# Patient Record
Sex: Female | Born: 1980 | Hispanic: No | Marital: Married | Smoking: Current every day smoker
Health system: Southern US, Community
[De-identification: ages and names within clinical notes are randomized; demographics above are authoritative.]

## PROBLEM LIST (undated history)

## (undated) DIAGNOSIS — H269 Unspecified cataract: Secondary | ICD-10-CM

## (undated) DIAGNOSIS — Z720 Tobacco use: Secondary | ICD-10-CM

## (undated) DIAGNOSIS — N809 Endometriosis, unspecified: Secondary | ICD-10-CM

## (undated) DIAGNOSIS — T7840XA Allergy, unspecified, initial encounter: Secondary | ICD-10-CM

## (undated) DIAGNOSIS — D649 Anemia, unspecified: Secondary | ICD-10-CM

## (undated) DIAGNOSIS — J45909 Unspecified asthma, uncomplicated: Secondary | ICD-10-CM

## (undated) HISTORY — PX: OTHER SURGICAL HISTORY: SHX169

## (undated) HISTORY — DX: Endometriosis, unspecified: N80.9

## (undated) HISTORY — PX: EYE SURGERY: SHX253

## (undated) HISTORY — PX: ENDOMETRIAL ABLATION: SHX621

## (undated) HISTORY — PX: SPINE SURGERY: SHX786

## (undated) HISTORY — PX: TUBAL LIGATION: SHX77

## (undated) HISTORY — DX: Tobacco use: Z72.0

## (undated) HISTORY — PX: ABDOMINAL HYSTERECTOMY: SHX81

## (undated) HISTORY — PX: PARTIAL HYSTERECTOMY: SHX80

## (undated) HISTORY — PX: BACK SURGERY: SHX140

## (undated) HISTORY — DX: Allergy, unspecified, initial encounter: T78.40XA

## (undated) HISTORY — PX: ROBOT ASSISTED TRACHELECTOMY: SHX6728

## (undated) HISTORY — DX: Unspecified cataract: H26.9

---
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
## 2011-08-31 HISTORY — PX: OTHER SURGICAL HISTORY: SHX169

## 2015-08-31 HISTORY — PX: PARTIAL HYSTERECTOMY: SHX80

## 2019-03-21 ENCOUNTER — Emergency Department (HOSPITAL_COMMUNITY)
Admission: EM | Admit: 2019-03-21 | Discharge: 2019-03-21 | Disposition: A | Discharge status: Home / Self Care | Payer: Medicaid Other | Attending: Emergency Medicine | Admitting: Emergency Medicine

## 2019-03-21 ENCOUNTER — Encounter (HOSPITAL_COMMUNITY): Payer: Self-pay

## 2019-03-21 DIAGNOSIS — L0291 Cutaneous abscess, unspecified: Secondary | ICD-10-CM

## 2019-03-21 DIAGNOSIS — J45909 Unspecified asthma, uncomplicated: Secondary | ICD-10-CM | POA: Diagnosis not present

## 2019-03-21 DIAGNOSIS — L02212 Cutaneous abscess of back [any part, except buttock]: Secondary | ICD-10-CM | POA: Insufficient documentation

## 2019-03-21 HISTORY — DX: Unspecified asthma, uncomplicated: J45.909

## 2019-03-21 MED ORDER — LIDOCAINE HCL 2 % IJ SOLN
20.0000 mL | Freq: Once | INTRAMUSCULAR | Status: AC
  Administered 2019-03-21: 400 mg
  Filled 2019-03-21: qty 20

## 2019-03-21 MED ORDER — IBUPROFEN 800 MG PO TABS: 800.0000 mg | ORAL_TABLET | Freq: Three times a day (TID) | ORAL | 0 refills | Status: DC | PRN

## 2019-03-21 NOTE — ED Notes (Signed)
Pt done HHN early this morning.  Has used inhaler only several times. Pt states "I don't want to use it unless I absolutely have to".  When this nurse inquired.  Pt states she is on her last inhaler and doesn't have a doctor.

## 2019-03-21 NOTE — Discharge Instructions (Addendum)
Return here as needed.  Follow-up with the surgeon as scheduled.  Keep the area around the opening clean and dry.  I would also keep the area covered.

## 2019-03-21 NOTE — ED Triage Notes (Signed)
Pt states that she has a abscess on her upper back that has been there for about 3 weeks with no drainage and that her asthma has been making her feel SOB for two days without relief for MDI, denies cough or fever.

## 2019-03-23 ENCOUNTER — Emergency Department (HOSPITAL_COMMUNITY)
Admission: EM | Admit: 2019-03-23 | Discharge: 2019-03-23 | Disposition: A | Discharge status: Home / Self Care | Payer: Medicaid Other | Attending: Emergency Medicine | Admitting: Emergency Medicine

## 2019-03-23 ENCOUNTER — Emergency Department (HOSPITAL_COMMUNITY): Payer: Medicaid Other

## 2019-03-23 ENCOUNTER — Other Ambulatory Visit: Payer: Self-pay

## 2019-03-23 ENCOUNTER — Encounter (HOSPITAL_COMMUNITY): Payer: Self-pay | Admitting: Emergency Medicine

## 2019-03-23 DIAGNOSIS — J45909 Unspecified asthma, uncomplicated: Secondary | ICD-10-CM | POA: Diagnosis not present

## 2019-03-23 DIAGNOSIS — R509 Fever, unspecified: Secondary | ICD-10-CM

## 2019-03-23 DIAGNOSIS — Z79899 Other long term (current) drug therapy: Secondary | ICD-10-CM | POA: Diagnosis not present

## 2019-03-23 DIAGNOSIS — Z20828 Contact with and (suspected) exposure to other viral communicable diseases: Secondary | ICD-10-CM | POA: Diagnosis not present

## 2019-03-23 DIAGNOSIS — M7918 Myalgia, other site: Secondary | ICD-10-CM | POA: Insufficient documentation

## 2019-03-23 DIAGNOSIS — R0602 Shortness of breath: Secondary | ICD-10-CM

## 2019-03-23 DIAGNOSIS — R52 Pain, unspecified: Secondary | ICD-10-CM

## 2019-03-23 LAB — BASIC METABOLIC PANEL
Anion gap: 8 (ref 5–15)
BUN: 7 mg/dL (ref 6–20)
CO2: 20 mmol/L — ABNORMAL LOW (ref 22–32)
Calcium: 9 mg/dL (ref 8.9–10.3)
Chloride: 110 mmol/L (ref 98–111)
Creatinine, Ser: 0.87 mg/dL (ref 0.44–1.00)
GFR calc Af Amer: >60 mL/min (ref >60)
GFR calc non Af Amer: >60 mL/min (ref >60)
Glucose, Bld: 138 mg/dL — ABNORMAL HIGH (ref 70–99)
Potassium: 3.7 mmol/L (ref 3.5–5.1)
Sodium: 138 mmol/L (ref 135–145)

## 2019-03-23 LAB — CBC WITH DIFFERENTIAL/PLATELET
Abs Immature Granulocytes: 0.02 10*3/uL (ref 0.00–0.07)
Basophils Absolute: 0 10*3/uL (ref 0.0–0.1)
Basophils Relative: 1 %
Eosinophils Absolute: 0 10*3/uL (ref 0.0–0.5)
Eosinophils Relative: 1 %
HCT: 50.7 % — ABNORMAL HIGH (ref 36.0–46.0)
Hemoglobin: 17.2 g/dL — ABNORMAL HIGH (ref 12.0–15.0)
Immature Granulocytes: 0 %
Lymphocytes Relative: 11 %
Lymphs Abs: 0.6 10*3/uL — ABNORMAL LOW (ref 0.7–4.0)
MCH: 30.9 pg (ref 26.0–34.0)
MCHC: 33.9 g/dL (ref 30.0–36.0)
MCV: 91 fL (ref 80.0–100.0)
Monocytes Absolute: 0.4 10*3/uL (ref 0.1–1.0)
Monocytes Relative: 8 %
Neutro Abs: 4.3 10*3/uL (ref 1.7–7.7)
Neutrophils Relative %: 79 %
Platelets: 143 10*3/uL — ABNORMAL LOW (ref 150–400)
RBC: 5.57 MIL/uL — ABNORMAL HIGH (ref 3.87–5.11)
RDW: 11.9 % (ref 11.5–15.5)
WBC: 5.4 10*3/uL (ref 4.0–10.5)
nRBC: 0 % (ref 0.0–0.2)

## 2019-03-23 LAB — D-DIMER, QUANTITATIVE: D-Dimer, Quant: 1.27 ug/mL-FEU — ABNORMAL HIGH (ref 0.00–0.50)

## 2019-03-23 IMAGING — CT CT ANGIOGRAPHY CHEST
2 of 7 series · 18 of 46 positions shown · IV contrast (APPLIED)
Comparison: Portable chest radiograph [DATE]

CLINICAL DATA: Chest pain, pressure and shortness of breath.

EXAM:
CT ANGIOGRAPHY CHEST WITH CONTRAST
TECHNIQUE: Multidetector CT imaging of the chest was performed using the
standard protocol during bolus administration of intravenous
contrast. Multiplanar CT image reconstructions and MIPs were
obtained to evaluate the vascular anatomy.
CONTRAST:  Sixty-three OMNIPAQUE IOHEXOL 350 MG/ML SOLN

[Series 7: thins · axial · 0.66mm/px · z∈[+1128,+1352]mm · 15 of 361 slices shown]
[im 21/361  lung]
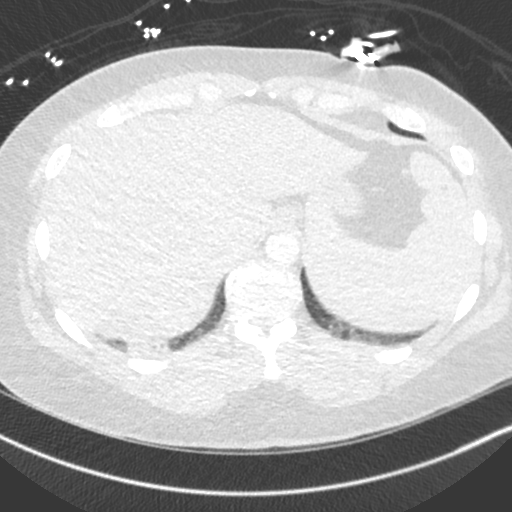
[im 41/361  soft-tissue]
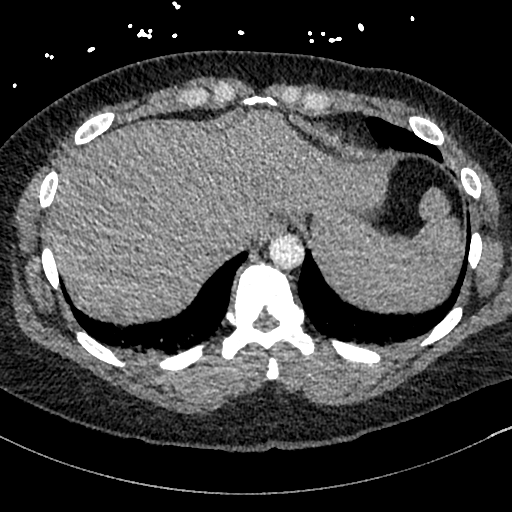
[im 61/361  lung]
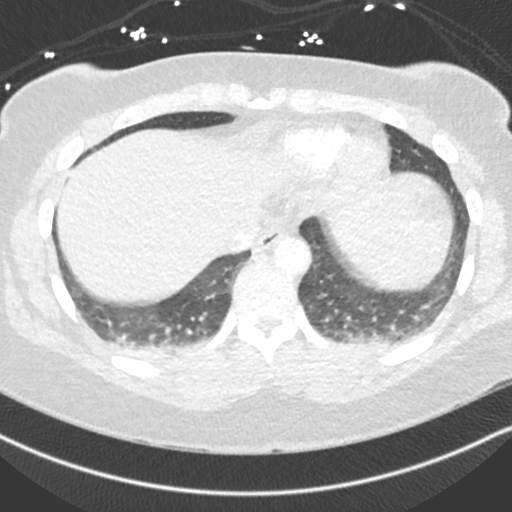
[im 81/361  soft-tissue]
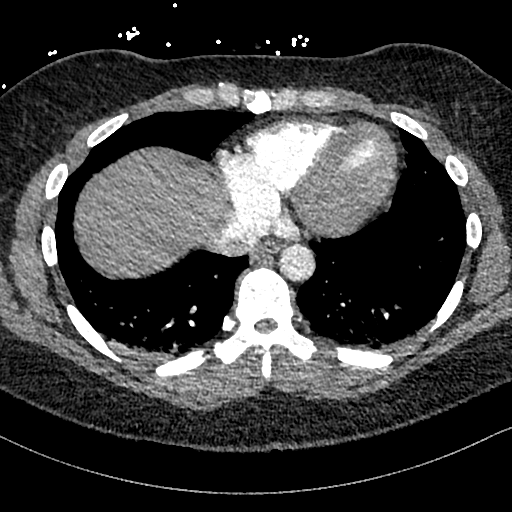
[im 121/361  lung]
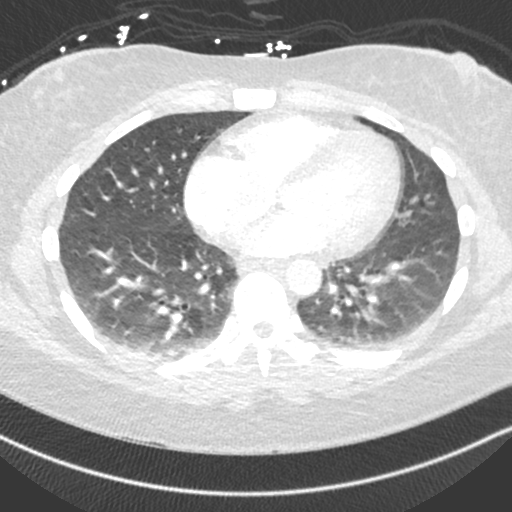
[im 141/361  soft-tissue]
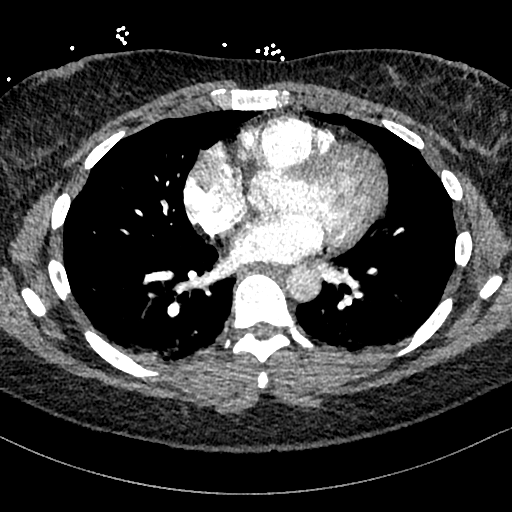
[im 161/361  lung]
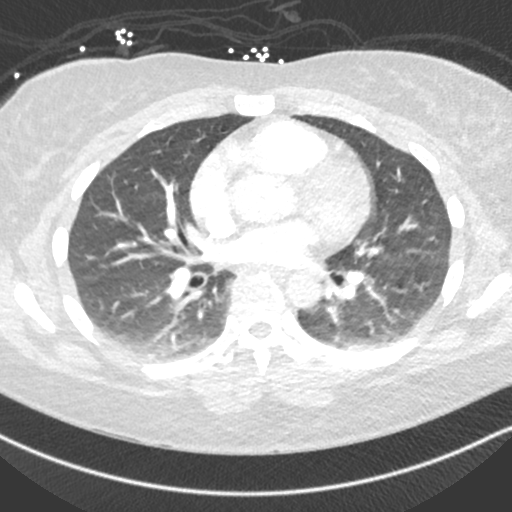
[im 181/361  soft-tissue]
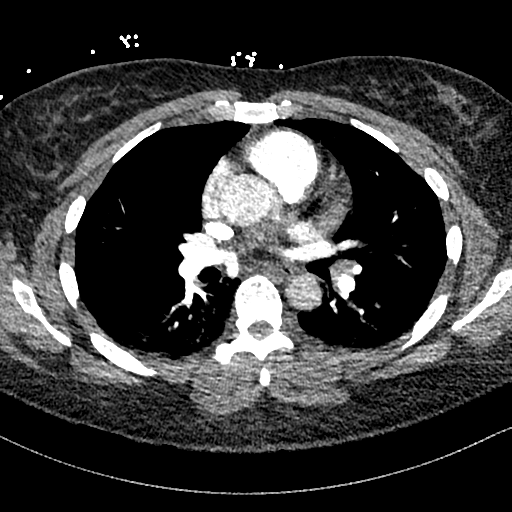
[im 201/361  lung]
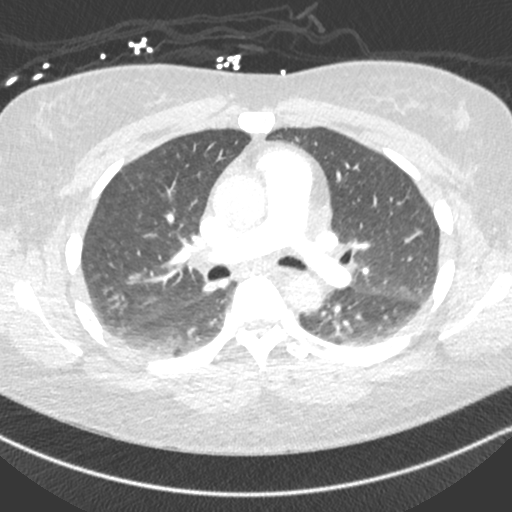
[im 221/361  soft-tissue]
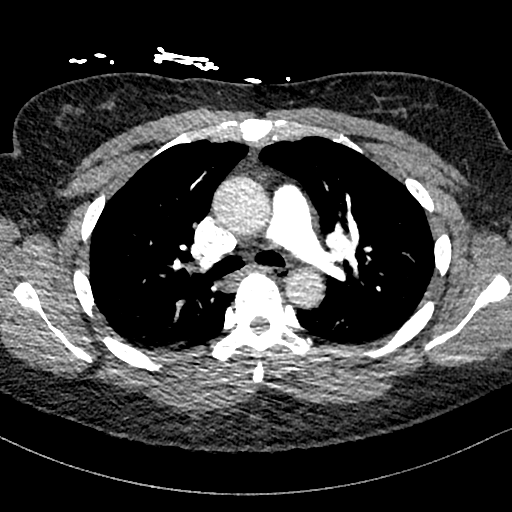
[im 241/361  lung]
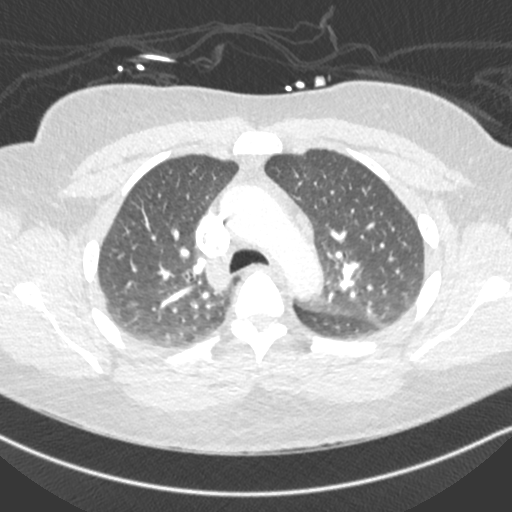
[im 281/361  soft-tissue]
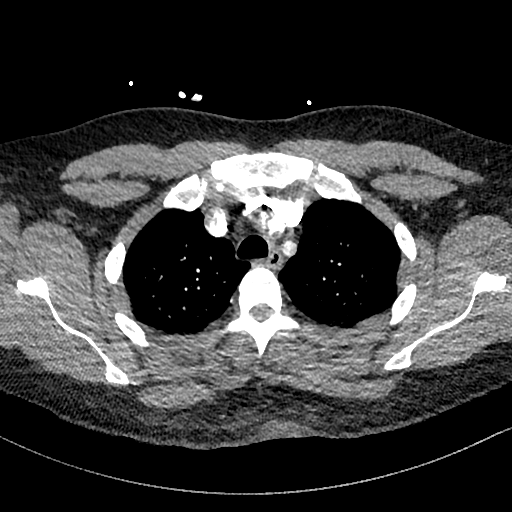
[im 301/361  lung]
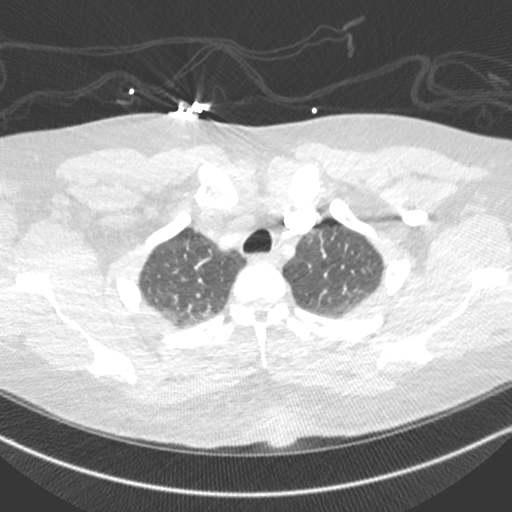
[im 321/361  soft-tissue]
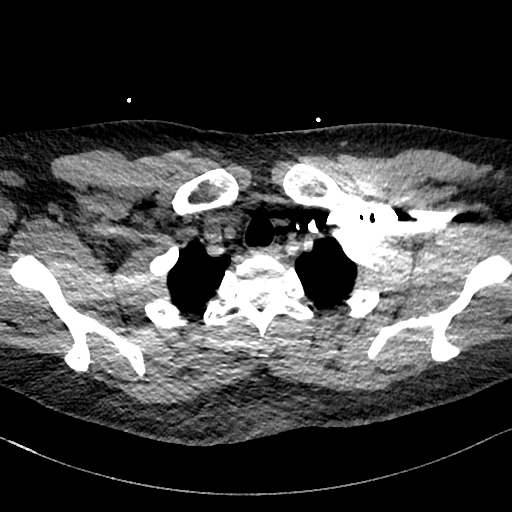
[im 341/361  lung]
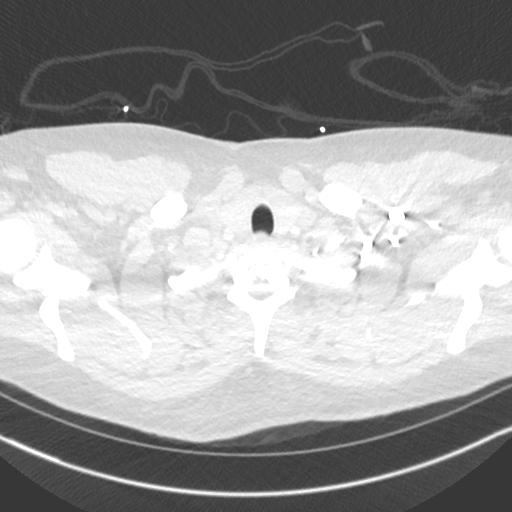

[Series 8: cor · coronal · 0.51mm/px · 3 of 120 slices shown]
[im 30/120  soft-tissue]
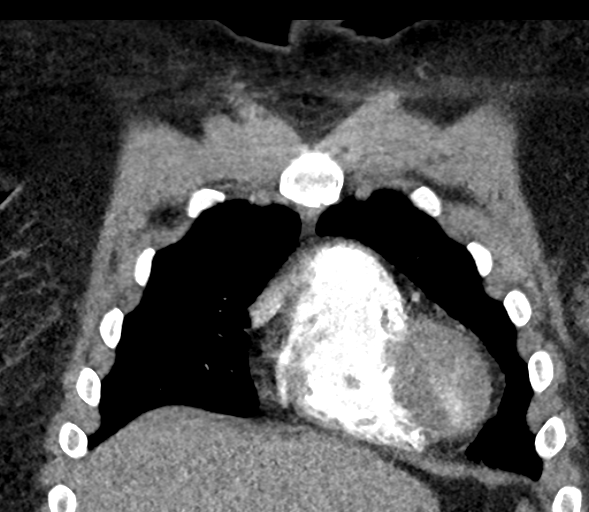
[im 60/120  soft-tissue]
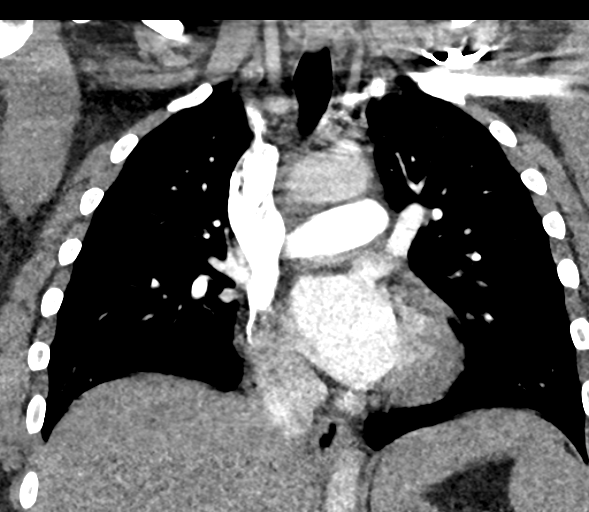
[im 90/120  soft-tissue]
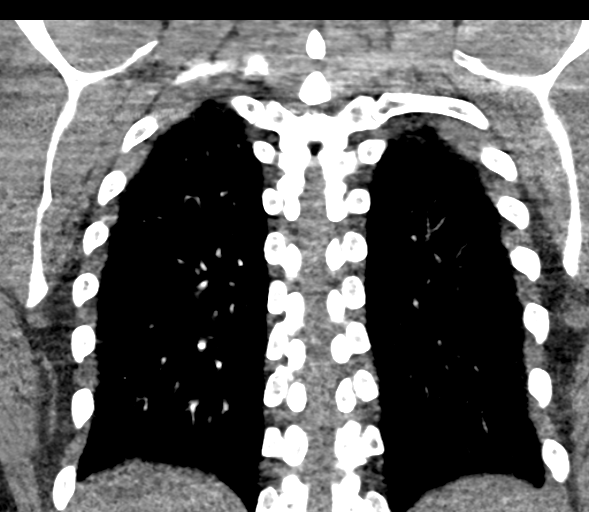

[18 of 46 positions shown; findings below may reference images not displayed]

FINDINGS: Cardiovascular: Satisfactory opacification of the pulmonary arteries
to the segmental level. No evidence of pulmonary embolism. Normal
heart size. No pericardial effusion.

Mediastinum/Nodes: No enlarged mediastinal, hilar, or axillary lymph
nodes. Thyroid gland, trachea, and esophagus demonstrate no
significant findings.

Lungs/Pleura: Bilateral dependent atelectatic changes, otherwise
clear lungs. No pleural effusion or pneumothorax.

Upper Abdomen: No acute abnormality.

Musculoskeletal: 3.6 x 2.5 cm soft tissue thickening in the upper
central back which involves the skin and subcutaneous tissues but
does not extend to the superficial muscular fascia. Normal osseous
structures.

Review of the MIP images confirms the above findings.
IMPRESSION: 1. No evidence of pulmonary embolus.
2. Bilateral dependent atelectatic changes.
3. 3.6 x 2.5 cm soft tissue thickening in the upper central back
which involves the skin and subcutaneous tissues but does not extend
to the superficial muscular fascia.

## 2019-03-23 IMAGING — DX PORTABLE CHEST - 1 VIEW
1 series · 1 of 1 positions shown · non-contrast
Comparison: None.

CLINICAL DATA: Short of breath fever

EXAM:
PORTABLE CHEST 1 VIEW

[chest ap]
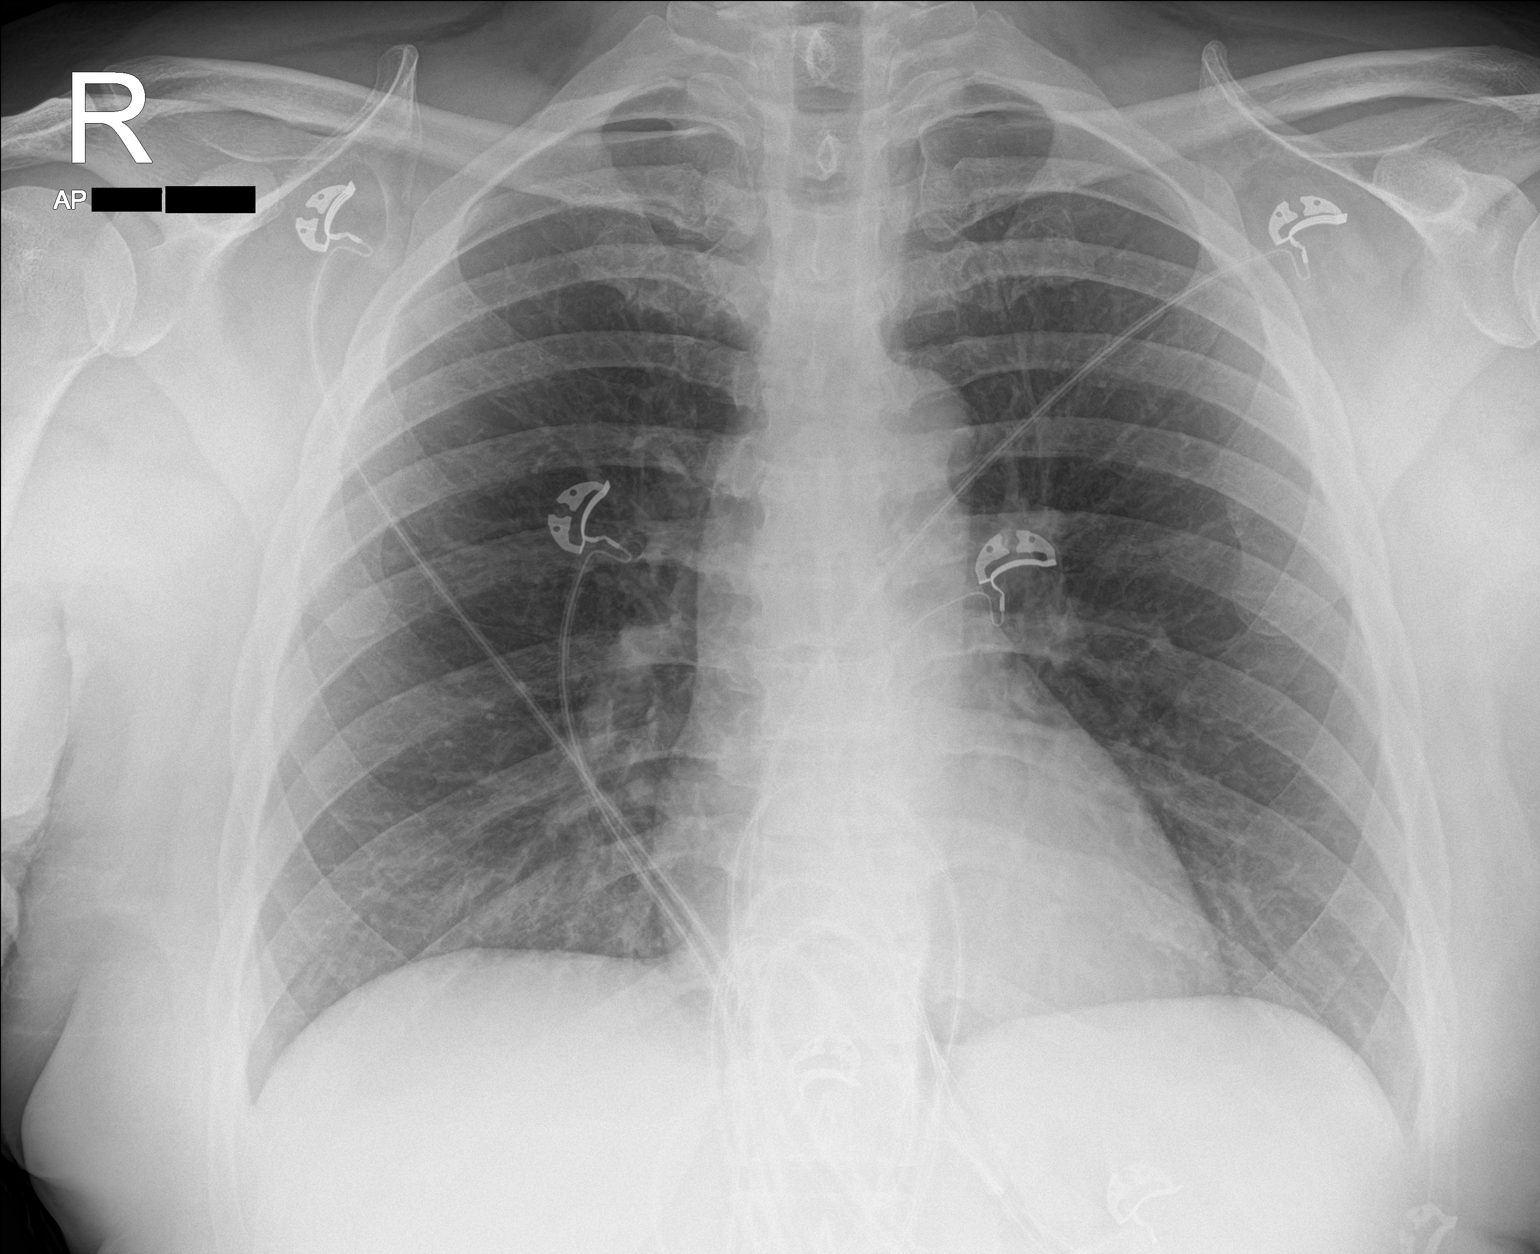

[1 of 1 positions shown; findings below may reference images not displayed]

FINDINGS: The heart size and mediastinal contours are within normal limits.
Both lungs are clear. The visualized skeletal structures are
unremarkable.
IMPRESSION: No active disease.

## 2019-03-23 MED ORDER — ACETAMINOPHEN 325 MG PO TABS
650.0000 mg | ORAL_TABLET | Freq: Once | ORAL | Status: AC
  Administered 2019-03-23: 650 mg via ORAL
  Filled 2019-03-23: qty 2

## 2019-03-23 MED ORDER — ALBUTEROL SULFATE HFA 108 (90 BASE) MCG/ACT IN AERS
4.0000 | INHALATION_SPRAY | Freq: Once | RESPIRATORY_TRACT | Status: AC
  Administered 2019-03-23: 12:00:00 4 via RESPIRATORY_TRACT
  Filled 2019-03-23: qty 6.7

## 2019-03-23 MED ORDER — IOHEXOL 350 MG/ML SOLN
100.0000 mL | Freq: Once | INTRAVENOUS | Status: AC | PRN
  Administered 2019-03-23: 100 mL via INTRAVENOUS

## 2019-03-23 MED ORDER — SODIUM CHLORIDE 0.9 % IV BOLUS
500.0000 mL | Freq: Once | INTRAVENOUS | Status: AC
  Administered 2019-03-23: 14:00:00 500 mL via INTRAVENOUS

## 2019-03-23 MED ORDER — CLINDAMYCIN HCL 150 MG PO CAPS: 150.0000 mg | ORAL_CAPSULE | Freq: Four times a day (QID) | ORAL | 0 refills | Status: DC

## 2019-03-23 NOTE — ED Notes (Signed)
Patient verbalizes understanding of discharge instructions. Opportunity for questioning and answers were provided. Armband removed by staff, pt discharged from ED ambulatory to home.  

## 2019-03-23 NOTE — ED Triage Notes (Signed)
Pt here for fever and chills. Pt had a recent abscess drained from her back. States yesterday her symptoms started. Pt has been taking motrin for her fever.

## 2019-03-23 NOTE — ED Notes (Signed)
Patient transported to CT 

## 2019-03-23 NOTE — ED Provider Notes (Signed)
[place] EMERGENCY DEPARTMENT Provider Note   [name]: [id number] Arrival date & time: 03/23/19  1138    History   Chief Complaint Chief Complaint  Patient presents with  . Shortness of Breath  . Fever  . Generalized Body Aches    HPI Kimberly Armstrong is a 38 y.o. female with a PMH of Asthma presents with fever and body aches onset yesterday. Patient also reports intermittent shortness of breath onset 4 days ago. Patient reports she was evaluated in the ER for an abscess on her back 2 days ago and had an incision and drainage performed. Patient endorses pain over abscess and reports bloody drainage. Patient denies purulent drainage or erythema. Patient states she has taken motrin and albuterol with partial relief of symptoms. Patient was advised to follow up with general surgery, but she has not seen them yet. Patient reports an intermittent dry cough. Patient denies chest pain, congestion, nausea, vomiting, diarrhea, or abdominal pain. Patient denies leg edema, leg pain, or oral contraceptive use. Patient denies sick exposures or recent travel.     HPI  Past Medical History:  Diagnosis Date  . Asthma     There are no active problems to display for this patient.   Past Surgical History:  Procedure Laterality Date  . BACK SURGERY    . carpel tunnel    . CESAREAN SECTION    . PARTIAL HYSTERECTOMY       OB History   No obstetric history on file.      Home Medications    Prior to Admission medications   Medication Sig Start Date End Date Taking? Authorizing Provider  clindamycin (CLEOCIN) 150 MG capsule Take 1 capsule (150 mg total) by mouth every 6 (six) hours. 03/23/19   Darlin Drop P, PA-C  ibuprofen (ADVIL) 800 MG tablet Take 1 tablet (800 mg total) by mouth every 8 (eight) hours as needed. 03/21/19   Dalia Heading, PA-C    Family History No family history on file.  Social History Social History   Tobacco Use  . Smoking  status: Never Smoker  . Smokeless tobacco: Never Used  Substance Use Topics  . Alcohol use: Never    Frequency: Never  . Drug use: Never     Allergies   Patient has no known allergies.   Review of Systems Review of Systems  Constitutional: Positive for fever. Negative for chills and diaphoresis.  Eyes: Negative for visual disturbance.  Respiratory: Positive for cough and shortness of breath.   Cardiovascular: Negative for chest pain.  Gastrointestinal: Negative for abdominal pain, constipation, diarrhea, nausea and vomiting.  Endocrine: Negative for cold intolerance and heat intolerance.  Genitourinary: Negative for dysuria.  Musculoskeletal: Positive for back pain (Pt reports back pain only over abscess. ) and myalgias. Negative for gait problem, neck pain and neck stiffness.  Skin: Positive for wound. Negative for rash.  Allergic/Immunologic: Negative for immunocompromised state.  Neurological: Negative for weakness and numbness.  Hematological: Negative for adenopathy.    Physical Exam Updated Vital Signs BP 136/76   Pulse 86   Temp 99.8 F (37.7 C) (Oral)   Resp 18   Ht 5\' 10"  (1.778 m)   Wt 113.4 kg   SpO2 98%   BMI 35.87 kg/m   Physical Exam Vitals signs and nursing note reviewed.  Constitutional:      General: She is not in acute distress.    Appearance: She is well-developed. She is not diaphoretic.  HENT:  Head: Normocephalic and atraumatic.     Mouth/Throat:     Mouth: Mucous membranes are moist.     Pharynx: No pharyngeal swelling or oropharyngeal exudate.  Eyes:     Extraocular Movements: Extraocular movements intact.     Pupils: Pupils are equal, round, and reactive to light.  Neck:     Musculoskeletal: Normal range of motion and neck supple.  Cardiovascular:     Rate and Rhythm: Normal rate and regular rhythm.     Heart sounds: Normal heart sounds. No murmur. No friction rub. No gallop.   Pulmonary:     Effort: Pulmonary effort is normal.  No respiratory distress.     Breath sounds: Normal breath sounds. No decreased breath sounds, wheezing, rhonchi or rales.  Chest:     Chest wall: No tenderness.  Abdominal:     Palpations: Abdomen is soft.     Tenderness: There is no abdominal tenderness.  Musculoskeletal: Normal range of motion.     Cervical back: Normal. She exhibits normal range of motion, no tenderness and no bony tenderness.     Thoracic back: Normal. She exhibits normal range of motion, no tenderness and no bony tenderness.     Lumbar back: Normal. She exhibits normal range of motion, no tenderness and no bony tenderness.  Skin:    General: Skin is warm.     Findings: No erythema, rash or wound.       Neurological:     Mental Status: She is alert.      ED Treatments / Results  Labs (all labs ordered are listed, but only abnormal results are displayed) Labs Reviewed  BASIC METABOLIC PANEL - Abnormal; Notable for the following components:      Result Value   CO2 20 (*)    Glucose, Bld 138 (*)    All other components within normal limits  CBC WITH DIFFERENTIAL/PLATELET - Abnormal; Notable for the following components:   RBC 5.57 (*)    Hemoglobin 17.2 (*)    HCT 50.7 (*)    Platelets 143 (*)    Lymphs Abs 0.6 (*)    All other components within normal limits  D-DIMER, QUANTITATIVE (NOT AT [facility]) - Abnormal; Notable for the following components:   D-Dimer, Quant 1.27 (*)    All other components within normal limits  NOVEL CORONAVIRUS, NAA (HOSPITAL ORDER, SEND-OUT TO REF LAB)    EKG EKG Interpretation  Date/Time:  Friday March 23 2019 11:56:51 EDT Ventricular Rate:  100 PR Interval:    QRS Duration: 89 QT Interval:  346 QTC Calculation: 447 R Axis:   -12 Text Interpretation:  Sinus tachycardia No old tracing to compare Confirmed by Dorie Rank [phone] on 03/23/2019 12:05:31 PM   Radiology Ct Angio Chest Pe W/cm &/or Wo Cm  Result Date: 03/23/2019 CLINICAL DATA:  Chest pain, pressure and  shortness of breath. EXAM: CT ANGIOGRAPHY CHEST WITH CONTRAST TECHNIQUE: Multidetector CT imaging of the chest was performed using the standard protocol during bolus administration of intravenous contrast. Multiplanar CT image reconstructions and MIPs were obtained to evaluate the vascular anatomy. CONTRAST:  Sixty-three OMNIPAQUE IOHEXOL 350 MG/ML SOLN COMPARISON:  Portable chest radiograph March 23, 2019 FINDINGS: Cardiovascular: Satisfactory opacification of the pulmonary arteries to the segmental level. No evidence of pulmonary embolism. Normal heart size. No pericardial effusion. Mediastinum/Nodes: No enlarged mediastinal, hilar, or axillary lymph nodes. Thyroid gland, trachea, and esophagus demonstrate no significant findings. Lungs/Pleura: Bilateral dependent atelectatic changes, otherwise clear lungs. No pleural effusion or  pneumothorax. Upper Abdomen: No acute abnormality. Musculoskeletal: 3.6 x 2.5 cm soft tissue thickening in the upper central back which involves the skin and subcutaneous tissues but does not extend to the superficial muscular fascia. Normal osseous structures. Review of the MIP images confirms the above findings. IMPRESSION: 1. No evidence of pulmonary embolus. 2. Bilateral dependent atelectatic changes. 3. 3.6 x 2.5 cm soft tissue thickening in the upper central back which involves the skin and subcutaneous tissues but does not extend to the superficial muscular fascia. Electronically Signed   By: Fidela Salisbury M.D.   On: 03/23/2019 15:09   Dg Chest Port 1 View  Result Date: 03/23/2019 CLINICAL DATA:  Short of breath fever EXAM: PORTABLE CHEST 1 VIEW COMPARISON:  None. FINDINGS: The heart size and mediastinal contours are within normal limits. Both lungs are clear. The visualized skeletal structures are unremarkable. IMPRESSION: No active disease. Electronically Signed   By: Franchot Gallo M.D.   On: 03/23/2019 12:39    Procedures Procedures (including critical care time)   Medications Ordered in ED Medications  albuterol (VENTOLIN HFA) 108 (90 Base) MCG/ACT inhaler 4 puff (4 puffs Inhalation Given 03/23/19 1212)  sodium chloride 0.9 % bolus 500 mL (0 mLs Intravenous Stopped 03/23/19 1529)  acetaminophen (TYLENOL) tablet 650 mg (650 mg Oral Given 03/23/19 1404)  iohexol (OMNIPAQUE) 350 MG/ML injection 100 mL (100 mLs Intravenous Contrast Given 03/23/19 1447)    Initial Impression / Assessment and Plan / ED Course  I have reviewed the triage vital signs and the nursing notes.  Pertinent labs & imaging results that were available during my care of the patient were reviewed by me and considered in my medical decision making (see chart for details).  Clinical Course as of Mar 22 1536  Fri Mar 23, 2019  1242 No active disease noted on CXR.  DG Chest Port 1 View [AH]  1249 D dimer elevated at 1.27.   D-Dimer, Quant(!): 1.27 [AH]  1349 WBCs are within normal limits.  WBC: 5.4 [AH]  1512 1. No evidence of pulmonary embolus. 2. Bilateral dependent atelectatic changes. 3. 3.6 x 2.5 cm soft tissue thickening in the upper central back which involves the skin and subcutaneous tissues but does not extend to the superficial muscular fascia.     CT Angio Chest PE W/Cm &/Or Wo Cm [AH]    Clinical Course User Index [AH] Arville Lime, PA-C      Patient presents with shortness of breath, fever, chills, cough, and body aches.  Vitals, labs, and imaging reviewed. EKG reveals sinus tachycardia. No old tracing available for comparison. CXR was negative. WBCs are within normal limits. D dimer ordered due to tachypnea, tachycardia, and shortness of breath symptoms. D dimer elevated at 1.27. CTA ordered. CTA negative for PE. CTA reveals 3.6 x 2.5 cm soft tissue thickening in the upper central back involving skin and subcutaneous tissues, but does not extend to superficial muscular fascia. Oxygen saturations have been stable on room air while in the ER. Ordered COVID-19 testing  due to symptoms. Patient recently had an abscess drained. No surrounding erythema, but more bloody discharge reported. Packing was removed and dressing changed. Will prescribe doxycyline for abscess. Advised patient to follow up with PCP and general surgery. Patient is stable in no acute distress. Discussed strict return precautions. Patient states she understands and agrees with plan.   Findings and plan of care discussed with supervising physician Dr. Tomi Bamberger.   Final Clinical Impressions(s) / ED Diagnoses  Final diagnoses:  Fever in adult  Shortness of breath  Generalized body aches    ED Discharge Orders         Ordered    clindamycin (CLEOCIN) 150 MG capsule  Every 6 hours     03/23/19 [place], [place], [place] 03/23/19 1538    Dorie Rank, MD 03/24/19 [phone]

## 2019-03-23 NOTE — Discharge Instructions (Addendum)
You have been seen today for fever, shortness of breath, and body aches. Your COVID-19 results are pending. You will receive a call if results are positive. Please read and follow all provided instructions.   1. Medications: clindamycin (antibiotic), usual home medications 2. Treatment: rest, drink plenty of fluids 3. Follow Up: Please call and follow up with your general surgeon. Please follow up with your primary doctor in 2 days for discussion of your diagnoses and further evaluation after today's visit; if you do not have a primary care doctor use the resource guide provided to find one; Please return to the ER for any new or worsening symptoms. Please obtain all of your results from medical records or have your doctors office obtain the results - share them with your doctor - you should be seen at your doctors office. Call today to arrange your follow up.  4. Please follow instructions for isolation. We did test you for COVID-19 (coronavirus), and it is still a possibility that you may have been exposed. Please isolate yourself for at least 3 days since recovery without a fever, improvement in respiratory symptoms (cough, shortness of breath), and at least 7 days have passed since symptoms first appeared.   Take medications as prescribed. Please review all of the medicines and only take them if you do not have an allergy to them. Return to the emergency room for worsening condition or new concerning symptoms. Follow up with your regular doctor. If you don't have a regular doctor use one of the numbers below to establish a primary care doctor.  Please be aware that if you are taking birth control pills, taking other prescriptions, ESPECIALLY ANTIBIOTICS may make the birth control ineffective - if this is the case, either do not engage in sexual activity or use alternative methods of birth control such as condoms until you have finished the medicine and your family doctor says it is OK to restart them.  If you are on a blood thinner such as COUMADIN, be aware that any other medicine that you take may cause the coumadin to either work too much, or not enough - you should have your coumadin level rechecked in next 7 days if this is the case.  ?  It is also a possibility that you have an allergic reaction to any of the medicines that you have been prescribed - Everybody reacts differently to medications and while MOST people have no trouble with most medicines, you may have a reaction such as nausea, vomiting, rash, swelling, shortness of breath. If this is the case, please stop taking the medicine immediately and contact your physician.  ?  You should return to the ER if you develop severe or worsening symptoms.   Emergency Department Resource Guide 1) Find a Doctor and Pay Out of Pocket Although you won't have to find out who is covered by your insurance plan, it is a good idea to ask around and get recommendations. You will then need to call the office and see if the doctor you have chosen will accept you as a new patient and what types of options they offer for patients who are self-pay. Some doctors offer discounts or will set up payment plans for their patients who do not have insurance, but you will need to ask so you aren't surprised when you get to your appointment.  2) Contact Your Local Health Department Not all health departments have doctors that can see patients for sick visits, but many do, so  it is worth a call to see if yours does. If you don't know where your local health department is, you can check in your phone book. The CDC also has a tool to help you locate your state's health department, and many state websites also have listings of all of their local health departments.  3) Find a [facility] If your illness is not likely to be very severe or complicated, you may want to try a walk in clinic. These are popping up all over the country in pharmacies, drugstores, and shopping  centers. They're usually staffed by nurse practitioners or physician assistants that have been trained to treat common illnesses and complaints. They're usually fairly quick and inexpensive. However, if you have serious medical issues or chronic medical problems, these are probably not your best option.  No Primary Care Doctor: Call Health Connect at  [phone] - they can help you locate a primary care doctor that  accepts your insurance, provides certain services, etc. Physician Referral Service- [phone]  Chronic Pain Problems: Organization         Address  Phone   Notes  [facility]  [phone] Patients need to be referred by their primary care doctor.   Medication Assistance: Organization         Address  Phone   Notes  [facility] Medication [facility] [address] [place], [place] [address] [phone] --Must be a resident of [place] -- Must have NO insurance coverage whatsoever (no Medicaid/ Medicare, etc.) -- The pt. MUST have a primary care doctor that directs their care regularly and follows them in the community   [facility]  [phone]   [employer]  [phone]    Agencies that provide inexpensive medical care: Organization         Address  Phone   Notes  [place]  [phone]   [facility]    [phone]   [facility] [place], [place] [address] [phone]   [place] [address], [place] [phone]   [facility]    [phone]   [facility]    [phone]   [place] and [place] [address], [place] Phone:  [phone], Fax:  [phone] Hours of Operation:  9 am - 6 pm, M-F.  Also accepts Medicaid/Medicare and self-pay.  [facility] for [address], [address], [place] Phone: [phone], Fax: [phone]. Hours of Operation:  8:30 am - 5:30 pm, M-F.  Also accepts Medicaid and self-pay.  [facility] [address] [place] Phone: [phone]   [place], [place], [place] [phone], Ext. [phone] Mondays & Thursdays: 7-9 AM.  First 15 patients are seen on a first come, first serve basis.    [place] Providers:  Organization         Address  Phone   Notes  [facility] [address], [place] [phone] Also accepts self-pay patients.  [place], [place]  [phone]   [place], [address], [place] [phone]   [facility] [address], [place] [phone]   Lucianne Lei [address], [place]   [phone] Only accepts [employer] patients after they have their name applied to their card.   Self-Pay (no insurance) in [facility]:  Organization         Address  Phone   Notes  Sickle Cell Patients, [facility] [place] [phone]   [facility] [place] [phone]   [facility] [place], [place], [place] [phone]   [facility]/Dr. Osei-Bonsu  [address] [place] or [address], [place] [phone] Phone number for both [place] and [place] locations is the same.  Urgent [facility] and [facility] [address] [place] [phone]   [facility] [address], [place] or [address] [phone]   [facility] [address], [place] [phone], phone; [phone], fax Sees patients 1st and 3rd Saturday of every month.  Must not qualify for public or private insurance (i.e.  Medicaid, Medicare, [place] [employer] Choice, Veterans' Benefits)  Household income should be no more than 200% of the poverty level The clinic cannot treat you if you are pregnant or think you are pregnant  Sexually transmitted diseases are not treated at the clinic.

## 2019-03-24 LAB — NOVEL CORONAVIRUS, NAA (HOSP ORDER, SEND-OUT TO REF LAB; TAT 18-24 HRS): SARS-CoV-2, NAA: NOT DETECTED

## 2019-03-27 NOTE — ED Provider Notes (Signed)
[facility] EMERGENCY DEPARTMENT Provider Note   [name]: [id number] Arrival date & time: 03/21/19  1914     History   Chief Complaint Chief Complaint  Patient presents with  . Abscess  . Asthma    HPI Kimberly Armstrong is a 38 y.o. female.     HPI Patient presents to the emergency department with an abscess to the midportion of her back.  The patient states she is due to see a surgeon on Friday but has continued to have worsening symptoms.  The patient states that she would like to have the area evaluated.  The patient she said no fever, nausea, vomiting, weakness, dizziness, headache, blurred vision, near-syncope or syncope.  Patient states this area is been developing over the last 3 weeks. Past Medical History:  Diagnosis Date  . Asthma     There are no active problems to display for this patient.   Past Surgical History:  Procedure Laterality Date  . BACK SURGERY    . carpel tunnel    . CESAREAN SECTION    . PARTIAL HYSTERECTOMY       OB History   No obstetric history on file.      Home Medications    Prior to Admission medications   Medication Sig Start Date End Date Taking? Authorizing Provider  clindamycin (CLEOCIN) 150 MG capsule Take 1 capsule (150 mg total) by mouth every 6 (six) hours. 03/23/19   Darlin Drop P, PA-C  ibuprofen (ADVIL) 800 MG tablet Take 1 tablet (800 mg total) by mouth every 8 (eight) hours as needed. 03/21/19   Dalia Heading, PA-C    Family History No family history on file.  Social History Social History   Tobacco Use  . Smoking status: Never Smoker  . Smokeless tobacco: Never Used  Substance Use Topics  . Alcohol use: Never    Frequency: Never  . Drug use: Never     Allergies   Patient has no known allergies.   Review of Systems Review of Systems All other systems negative except as documented in the HPI. All pertinent positives and negatives as reviewed in the HPI.  Physical Exam  Updated Vital Signs BP 131/89 (BP Location: Right Arm)   Pulse 71   Temp 99 F (37.2 C) (Oral)   Resp 16   SpO2 100%   Physical Exam Vitals signs and nursing note reviewed.  Constitutional:      General: She is not in acute distress.    Appearance: She is well-developed.  HENT:     Head: Normocephalic and atraumatic.  Eyes:     Pupils: Pupils are equal, round, and reactive to light.  Pulmonary:     Effort: Pulmonary effort is normal.  Musculoskeletal:       Back:  Skin:    General: Skin is warm and dry.  Neurological:     Mental Status: She is alert and oriented to person, place, and time.      ED Treatments / Results  Labs (all labs ordered are listed, but only abnormal results are displayed) Labs Reviewed - No data to display  EKG None  Radiology No results found.  Procedures Procedures (including critical care time)  Medications Ordered in ED Medications  lidocaine (XYLOCAINE) 2 % (with pres) injection 400 mg (400 mg Infiltration Given by Other 03/21/19 2106)     Initial Impression / Assessment and Plan / ED Course  I have reviewed the triage vital signs and the nursing  notes.  Pertinent labs & imaging results that were available during my care of the patient were reviewed by me and considered in my medical decision making (see chart for details).        INCISION AND DRAINAGE Performed by: Resa Miner Chante Mayson Consent: Verbal consent obtained. Risks and benefits: risks, benefits and alternatives were discussed Type: abscess  Body area: Midthoracic area  Anesthesia: local infiltration  Incision was made with a scalpel.  Local anesthetic: lidocaine 2 % without epinephrine  Anesthetic total: 6 ml  Complexity: complex Blunt dissection to break up loculations  Drainage: purulent  Drainage amount: Large  Packing material: 1/4 in iodoform gauze  Patient tolerance: Patient tolerated the procedure well with no immediate complications.    Patient has an appointment with general surgery on Friday.  Advised to follow-up with them.  Have advised her to keep the area clean and dry.  Also advised her to use warm compresses around the area.  Final Clinical Impressions(s) / ED Diagnoses   Final diagnoses:  Abscess    ED Discharge Orders         Ordered    ibuprofen (ADVIL) 800 MG tablet  Every 8 hours PRN     03/21/19 2301           Dalia Heading, PA-C 03/27/19 [place], [place], DO 03/29/19 [phone]

## 2019-09-08 ENCOUNTER — Emergency Department (HOSPITAL_COMMUNITY): Payer: Medicaid Other

## 2019-09-08 ENCOUNTER — Emergency Department (HOSPITAL_COMMUNITY)
Admission: EM | Admit: 2019-09-08 | Discharge: 2019-09-08 | Disposition: A | Discharge status: Home / Self Care | Payer: Medicaid Other | Attending: Emergency Medicine | Admitting: Emergency Medicine

## 2019-09-08 ENCOUNTER — Other Ambulatory Visit: Payer: Self-pay

## 2019-09-08 ENCOUNTER — Encounter (HOSPITAL_COMMUNITY): Payer: Self-pay | Admitting: Emergency Medicine

## 2019-09-08 DIAGNOSIS — R519 Headache, unspecified: Secondary | ICD-10-CM | POA: Insufficient documentation

## 2019-09-08 DIAGNOSIS — R5383 Other fatigue: Secondary | ICD-10-CM | POA: Diagnosis not present

## 2019-09-08 DIAGNOSIS — R110 Nausea: Secondary | ICD-10-CM | POA: Diagnosis not present

## 2019-09-08 DIAGNOSIS — Z79899 Other long term (current) drug therapy: Secondary | ICD-10-CM | POA: Insufficient documentation

## 2019-09-08 DIAGNOSIS — R42 Dizziness and giddiness: Secondary | ICD-10-CM | POA: Diagnosis not present

## 2019-09-08 DIAGNOSIS — J45909 Unspecified asthma, uncomplicated: Secondary | ICD-10-CM | POA: Diagnosis not present

## 2019-09-08 DIAGNOSIS — Z7982 Long term (current) use of aspirin: Secondary | ICD-10-CM | POA: Diagnosis not present

## 2019-09-08 DIAGNOSIS — R079 Chest pain, unspecified: Secondary | ICD-10-CM | POA: Insufficient documentation

## 2019-09-08 LAB — BASIC METABOLIC PANEL
Anion gap: 10 (ref 5–15)
BUN: 8 mg/dL (ref 6–20)
CO2: 21 mmol/L — ABNORMAL LOW (ref 22–32)
Calcium: 9.3 mg/dL (ref 8.9–10.3)
Chloride: 107 mmol/L (ref 98–111)
Creatinine, Ser: 0.79 mg/dL (ref 0.44–1.00)
GFR calc Af Amer: >60 mL/min (ref >60)
GFR calc non Af Amer: >60 mL/min (ref >60)
Glucose, Bld: 116 mg/dL — ABNORMAL HIGH (ref 70–99)
Potassium: 3.9 mmol/L (ref 3.5–5.1)
Sodium: 138 mmol/L (ref 135–145)

## 2019-09-08 LAB — CBC
HCT: 47.8 % — ABNORMAL HIGH (ref 36.0–46.0)
Hemoglobin: 16.2 g/dL — ABNORMAL HIGH (ref 12.0–15.0)
MCH: 30.7 pg (ref 26.0–34.0)
MCHC: 33.9 g/dL (ref 30.0–36.0)
MCV: 90.7 fL (ref 80.0–100.0)
Platelets: 210 10*3/uL (ref 150–400)
RBC: 5.27 MIL/uL — ABNORMAL HIGH (ref 3.87–5.11)
RDW: 12.2 % (ref 11.5–15.5)
WBC: 6.8 10*3/uL (ref 4.0–10.5)
nRBC: 0 % (ref 0.0–0.2)

## 2019-09-08 LAB — LIPID PANEL
Cholesterol: 157 mg/dL (ref 0–200)
HDL: 28 mg/dL — ABNORMAL LOW (ref >40)
LDL Cholesterol: 88 mg/dL (ref 0–99)
Total CHOL/HDL Ratio: 5.6 RATIO
Triglycerides: 206 mg/dL — ABNORMAL HIGH (ref <150)
VLDL: 41 mg/dL — ABNORMAL HIGH (ref 0–40)

## 2019-09-08 LAB — TSH: TSH: 1.175 u[IU]/mL (ref 0.350–4.500)

## 2019-09-08 LAB — I-STAT BETA HCG BLOOD, ED (MC, WL, AP ONLY): I-stat hCG, quantitative: <5 m[IU]/mL (ref <5)

## 2019-09-08 LAB — TROPONIN I (HIGH SENSITIVITY)
Troponin I (High Sensitivity): 5 ng/L (ref <18)
Troponin I (High Sensitivity): 5 ng/L (ref <18)

## 2019-09-08 LAB — T4, FREE: Free T4: 0.81 ng/dL (ref 0.61–1.12)

## 2019-09-08 IMAGING — DX DG CHEST 2V
2 series · 2 of 2 positions shown · non-contrast
Comparison: [DATE]

CLINICAL DATA: Chest pain

EXAM:
CHEST - 2 VIEW

[chest pa]
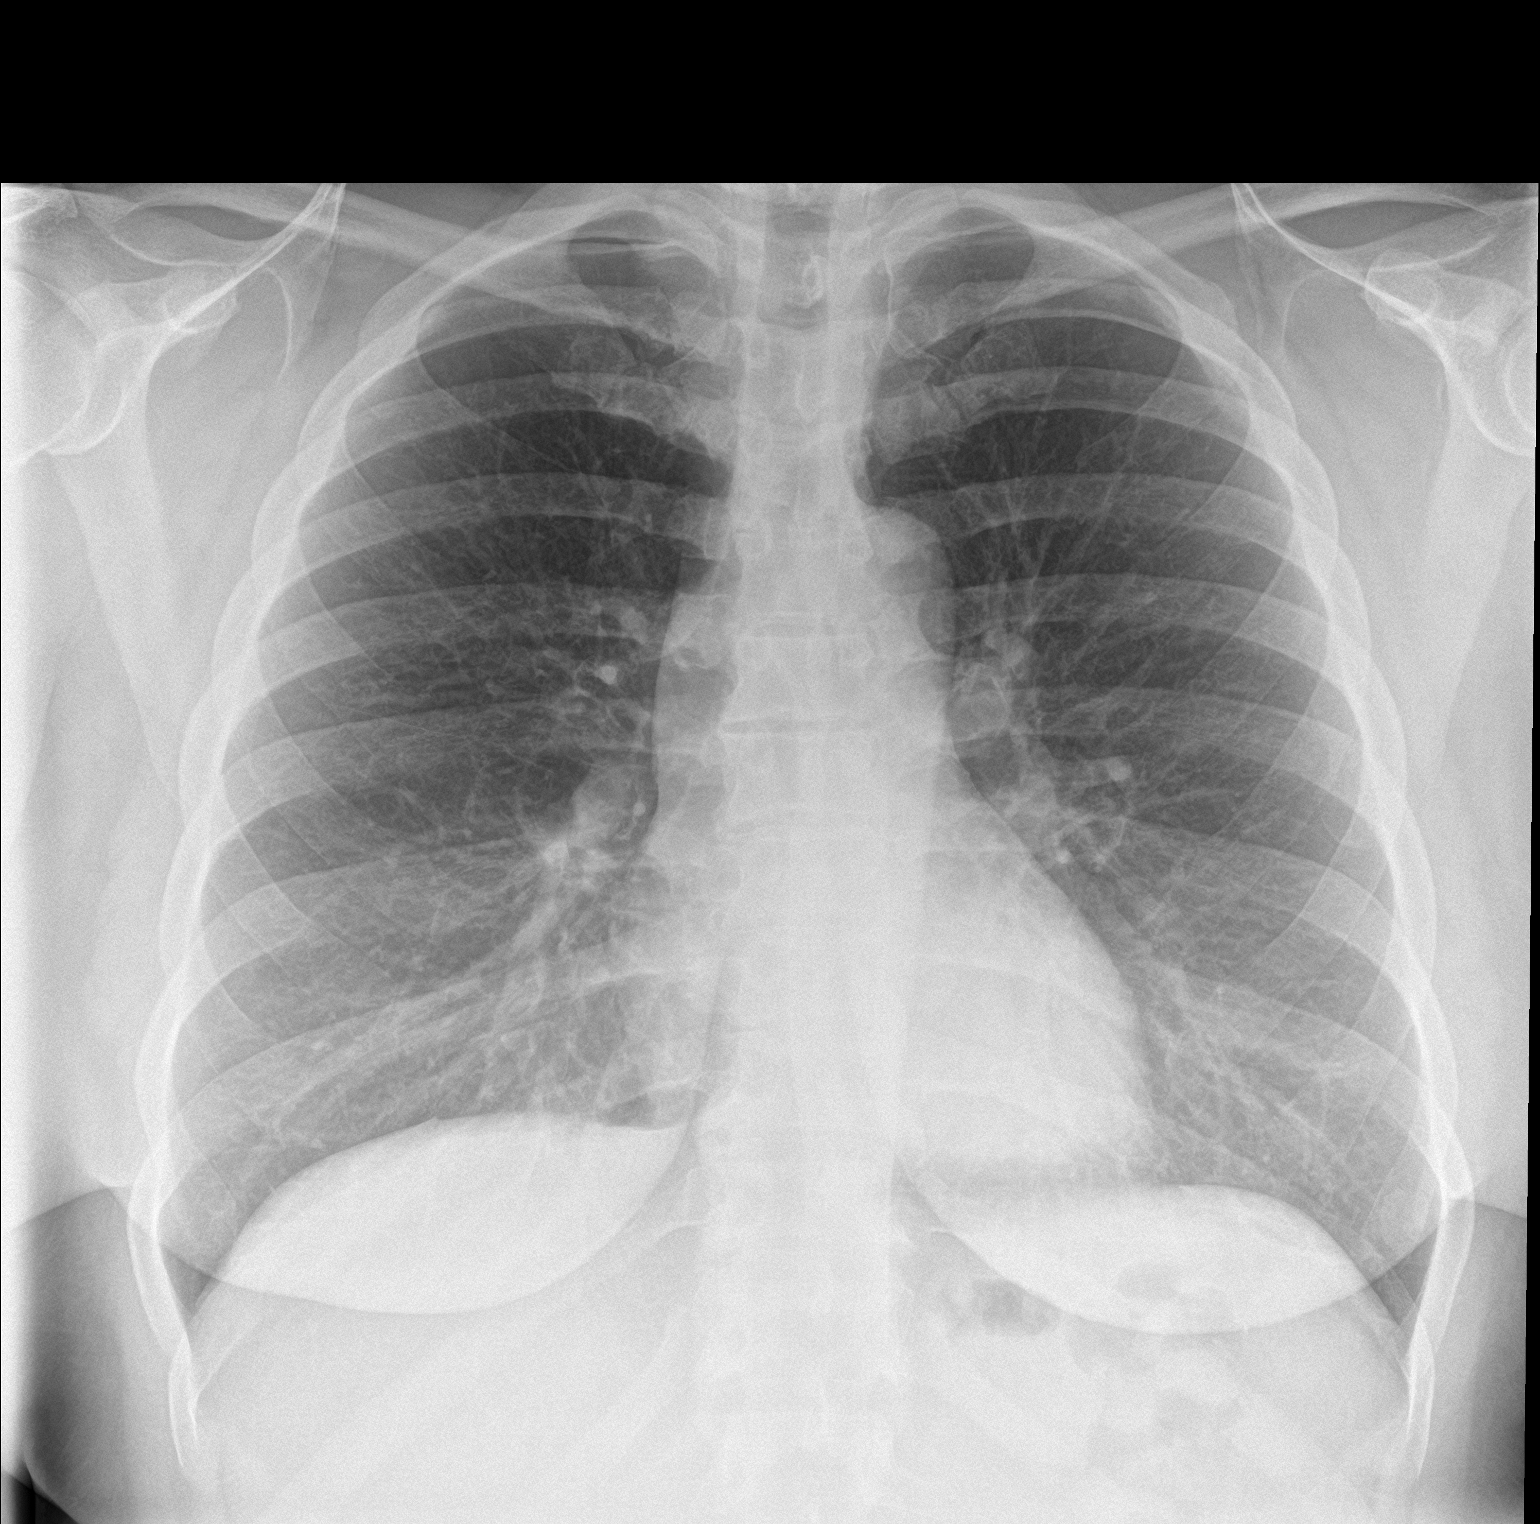

[chest lat]
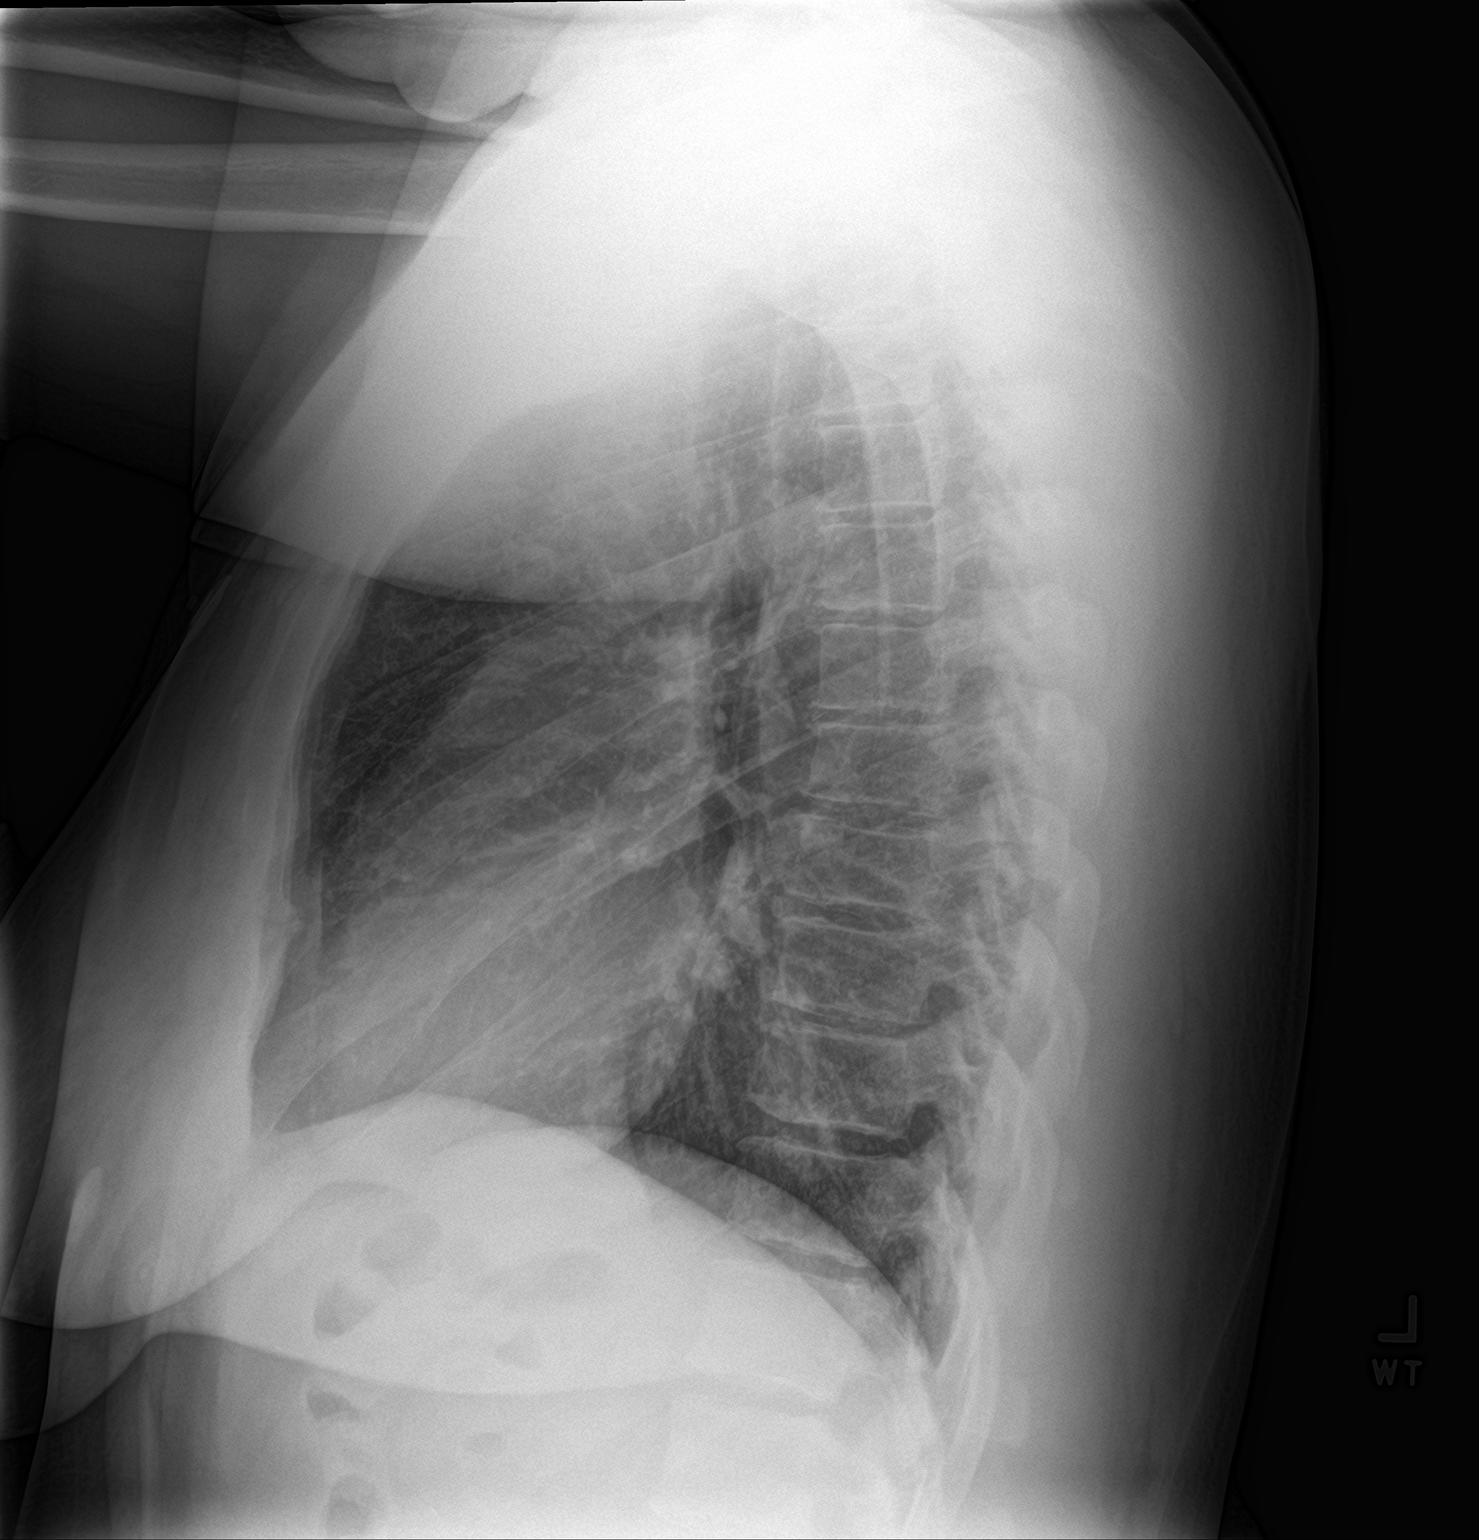

[2 of 2 positions shown; findings below may reference images not displayed]

FINDINGS: The heart size and mediastinal contours are within normal limits.
Both lungs are clear. No pleural effusion or pneumothorax. The
visualized skeletal structures are unremarkable.
IMPRESSION: No acute process in the chest.

## 2019-09-08 MED ORDER — ASPIRIN 81 MG PO CHEW
162.0000 mg | CHEWABLE_TABLET | Freq: Once | ORAL | Status: AC
  Administered 2019-09-08: 18:00:00 162 mg via ORAL
  Filled 2019-09-08: qty 2

## 2019-09-08 MED ORDER — ACETAMINOPHEN 500 MG PO TABS
1000.0000 mg | ORAL_TABLET | Freq: Once | ORAL | Status: AC
  Administered 2019-09-08: 1000 mg via ORAL
  Filled 2019-09-08: qty 2

## 2019-09-08 MED ORDER — NITROGLYCERIN 0.4 MG SL SUBL: 0.4000 mg | SUBLINGUAL_TABLET | SUBLINGUAL | Status: DC | PRN

## 2019-09-08 MED ORDER — SODIUM CHLORIDE 0.9% FLUSH: 3.0000 mL | Freq: Once | INTRAVENOUS | Status: DC

## 2019-09-08 NOTE — ED Triage Notes (Signed)
C/o sharp pain to center of chest that radiates to bilateral arms with nausea and fatigue since this morning.  Reports intermittent chest pain x 3 months.

## 2019-09-08 NOTE — ED Provider Notes (Signed)
[facility] EMERGENCY DEPARTMENT Provider Note   [name]: [id number] Arrival date & time: 09/08/19  1412     History Chief Complaint  Patient presents with  . Chest Pain    Kimberly Armstrong is a 39 y.o. female.  HPI      Pain in central chest, radiates to both arms Has occurred at least once per month for the past 3 mos Today woke up to the pain, took aspirin Drank water but still nauseated Headache When it happens doesn't want to use energy to exert self, not sharp or dull pain, just there, like a "blood rush" Feeling severe fatigue Hx of asthma Feel some chest tightness but not wheezing Once in a great while will have dyspnea Second time it happened was on phone with sister, had dyspnea with that episode, took inhaler Had 2 episodes today one at 830 one around 05-1029  CP not worse with walking. Today woke out of sleep. Second was on phone and walking in kitchen and had to sit down. Has fatigue on exertion but not necessarily chest pain  Has had it with walking around home but not with more vigorous activity for example walking up stairs  Current chest pain radiating to left arm, now 6/10, pressure arm, chest tightness, nausea and headache   No leg pain or swelling Lightheaded when going from sitting to standing No recent surgeries, no long trips, not on OCPs  Smoke cigarettes, rare etoh, no other drugs Mom had triple bypass at 8yo, had mild MI prior to that  Doesn't have regular doctor  Past Medical History:  Diagnosis Date  . Asthma     There are no problems to display for this patient.   Past Surgical History:  Procedure Laterality Date  . BACK SURGERY    . carpel tunnel    . CESAREAN SECTION    . PARTIAL HYSTERECTOMY       OB History   No obstetric history on file.     No family history on file.  Social History   Tobacco Use  . Smoking status: Never Smoker  . Smokeless tobacco: Never Used  Substance Use Topics   . Alcohol use: Never  . Drug use: Never    Home Medications Prior to Admission medications   Medication Sig Start Date End Date Taking? Authorizing Provider  acetaminophen (TYLENOL) 325 MG tablet Take 650 mg by mouth every 6 (six) hours as needed for mild pain or headache.   Yes [provider]  aspirin 81 MG chewable tablet Chew 81 mg by mouth daily.   Yes [provider]  ibuprofen (ADVIL) 200 MG tablet Take 200 mg by mouth every 6 (six) hours as needed for fever, headache or moderate pain.   Yes [provider]  clindamycin (CLEOCIN) 150 MG capsule Take 1 capsule (150 mg total) by mouth every 6 (six) hours. Patient not taking: Reported on 09/08/2019 03/23/19   Darlin Drop P, PA-C  ibuprofen (ADVIL) 800 MG tablet Take 1 tablet (800 mg total) by mouth every 8 (eight) hours as needed. Patient not taking: Reported on 09/08/2019 03/21/19   Dalia Heading, PA-C    Allergies    Patient has no known allergies.  Review of Systems   Review of Systems  Constitutional: Positive for fatigue. Negative for fever.  HENT: Negative for congestion.   Eyes: Negative for visual disturbance.  Respiratory: Negative for cough and shortness of breath.   Cardiovascular: Positive for chest pain.  Gastrointestinal: Positive for nausea. Negative for abdominal pain, diarrhea and vomiting.  Genitourinary: Negative for dysuria.  Skin: Negative for rash.  Neurological: Positive for light-headedness and headaches. Negative for weakness and numbness.    Physical Exam Updated Vital Signs BP 117/69   Pulse 66   Temp 98.5 F (36.9 C) (Oral)   Resp 19   SpO2 99%   Physical Exam Vitals and nursing note reviewed.  Constitutional:      General: She is not in acute distress.    Appearance: She is well-developed. She is not diaphoretic.  HENT:     Head: Normocephalic and atraumatic.  Eyes:     Conjunctiva/sclera: Conjunctivae normal.  Cardiovascular:     Rate and Rhythm:  Normal rate and regular rhythm.     Heart sounds: Normal heart sounds. No murmur. No friction rub. No gallop.   Pulmonary:     Effort: Pulmonary effort is normal. No respiratory distress.     Breath sounds: Normal breath sounds. No wheezing or rales.  Chest:     Chest wall: Tenderness present.  Abdominal:     General: There is no distension.     Palpations: Abdomen is soft.     Tenderness: There is no abdominal tenderness. There is no guarding.  Musculoskeletal:        General: No tenderness.     Cervical back: Normal range of motion.  Skin:    General: Skin is warm and dry.     Findings: No erythema or rash.  Neurological:     Mental Status: She is alert and oriented to person, place, and time.     ED Results / Procedures / Treatments   Labs (all labs ordered are listed, but only abnormal results are displayed) Labs Reviewed  BASIC METABOLIC PANEL - Abnormal; Notable for the following components:      Result Value   CO2 21 (*)    Glucose, Bld 116 (*)    All other components within normal limits  CBC - Abnormal; Notable for the following components:   RBC 5.27 (*)    Hemoglobin 16.2 (*)    HCT 47.8 (*)    All other components within normal limits  LIPID PANEL - Abnormal; Notable for the following components:   Triglycerides 206 (*)    HDL 28 (*)    VLDL 41 (*)    All other components within normal limits  TSH  T4, FREE  I-STAT BETA HCG BLOOD, ED ([facility], [facility], [facility] ONLY)  TROPONIN I (HIGH SENSITIVITY)  TROPONIN I (HIGH SENSITIVITY)    EKG EKG Interpretation  Date/Time:  Saturday September 08 2019 15:49:22 EST Ventricular Rate:  67 PR Interval:  140 QRS Duration: 101 QT Interval:  426 QTC Calculation: 450 R Axis:   -2 Text Interpretation: Sinus rhythm No significant change since last tracing Confirmed by Gareth Morgan [phone]) on 09/08/2019 4:23:19 PM   Radiology DG Chest 2 View  Result Date: 09/08/2019 CLINICAL DATA:  Chest pain EXAM: CHEST - 2 VIEW COMPARISON:   03/23/2019 FINDINGS: The heart size and mediastinal contours are within normal limits. Both lungs are clear. No pleural effusion or pneumothorax. The visualized skeletal structures are unremarkable. IMPRESSION: No acute process in the chest. Electronically Signed   By: Macy Mis M.D.   On: 09/08/2019 15:04    Procedures Procedures (including critical care time)  Medications Ordered in ED Medications  aspirin chewable tablet 162 mg (162 mg Oral Given 09/08/19 1821)  acetaminophen (TYLENOL) tablet 1,000  mg (1,000 mg Oral Given 09/08/19 1821)    ED Course  I have reviewed the triage vital signs and the nursing notes.  Pertinent labs & imaging results that were available during my care of the patient were reviewed by me and considered in my medical decision making (see chart for details).    MDM Rules/Calculators/A&P                      39yo female with history of asthma presents with concern for chest pain.  Differential diagnosis for chest pain includes pulmonary embolus, dissection, pneumothorax, pneumonia, ACS, myocarditis, pericarditis.  EKG was done and evaluate by me and showed no acute ST changes and no signs of pericarditis. Chest x-ray was done and evaluated by me and radiology and showed no sign of pneumonia or pneumothorax. Patient is PERC negative and low risk [place] and have low suspicion for PE.  History and exam not consistent with aortic dissection.   Initial troponin 5 and repeat unchanged.  CP improved. Discussed no signs of ACS.  While work up in ED is reassuring at this time, discussed with patient that given her family history of CAD she does require close follow up and strict return precautions.  Called Cardiology given symptoms and family history and spoke with Dr. Percival Spanish who is sending a message for patient to have close outpatient follow up early this week.   Did order labs to help with outpt care including lipid panel> Discussed low HDL, high tryglicerides with pt  and at this time recommend dietary changes and can further discussion medication management with Cardiology.  TSH normal.  Counseled patient on diet as well as smoking cessation and discussed reasons to return to ED>    Final Clinical Impression(s) / ED Diagnoses Final diagnoses:  Nonspecific chest pain    Rx / DC Orders ED Discharge Orders    None       Gareth Morgan, MD 09/09/19 2351

## 2019-12-27 ENCOUNTER — Telehealth: Payer: Self-pay | Admitting: *Deleted

## 2019-12-27 NOTE — Telephone Encounter (Signed)
Had received message from Dr Percival Spanish  about getting patient new patient appointment Unable to reach patient, number not in service

## 2020-10-03 ENCOUNTER — Ambulatory Visit
Admission: EM | Admit: 2020-10-03 | Discharge: 2020-10-03 | Disposition: A | Discharge status: Home / Self Care | Payer: Medicaid Other | Attending: Emergency Medicine | Admitting: Emergency Medicine

## 2020-10-03 ENCOUNTER — Other Ambulatory Visit: Payer: Self-pay

## 2020-10-03 DIAGNOSIS — N898 Other specified noninflammatory disorders of vagina: Secondary | ICD-10-CM | POA: Diagnosis not present

## 2020-10-03 DIAGNOSIS — R350 Frequency of micturition: Secondary | ICD-10-CM | POA: Diagnosis not present

## 2020-10-03 LAB — POCT URINALYSIS DIP (MANUAL ENTRY)
Bilirubin, UA: NEGATIVE
Glucose, UA: NEGATIVE mg/dL
Ketones, POC UA: NEGATIVE mg/dL
Nitrite, UA: POSITIVE — AB
Protein Ur, POC: 30 mg/dL — AB
Spec Grav, UA: 1.015 (ref 1.010–1.025)
Urobilinogen, UA: 0.2 E.U./dL
pH, UA: 6.5 (ref 5.0–8.0)

## 2020-10-03 MED ORDER — CEPHALEXIN 500 MG PO CAPS: 500.0000 mg | ORAL_CAPSULE | Freq: Two times a day (BID) | ORAL | 0 refills | Status: AC

## 2020-10-03 NOTE — ED Provider Notes (Signed)
[name]    [name]: [id number] Arrival date & time: 10/03/20  0831      History   Chief Complaint Chief Complaint  Patient presents with  . UTI    HPI Kimberly Armstrong is a 40 y.o. female presenting today for evaluation of possible UTI.  She reports that she has had urinary frequency, urgency and incomplete voiding.  Has alsodeveloped vaginal burning and lower abdominal pain. Symptoms began 1 week ago.  Does report remote history of UTIs/BV/yeast, but no recurrent history of any.  Took urosept over-the-counter, patient does report this has altered the color of her urine.  HPI  Past Medical History:  Diagnosis Date  . Asthma     There are no problems to display for this patient.   Past Surgical History:  Procedure Laterality Date  . BACK SURGERY    . carpel tunnel    . CESAREAN SECTION    . PARTIAL HYSTERECTOMY      OB History   No obstetric history on file.      Home Medications    Prior to Admission medications   Medication Sig Start Date End Date Taking? Authorizing Provider  cephALEXin (KEFLEX) 500 MG capsule Take 1 capsule (500 mg total) by mouth 2 (two) times daily for 7 days. 10/03/20 10/10/20 Yes Marquay Kruse C, PA-C  acetaminophen (TYLENOL) 325 MG tablet Take 650 mg by mouth every 6 (six) hours as needed for mild pain or headache.    [provider]  aspirin 81 MG chewable tablet Chew 81 mg by mouth daily.    [provider]    Family History Family History  Family history unknown: Yes    Social History Social History   Tobacco Use  . Smoking status: Never Smoker  . Smokeless tobacco: Never Used  Substance Use Topics  . Alcohol use: Never  . Drug use: Never     Allergies   Patient has no known allergies.   Review of Systems Review of Systems  Constitutional: Negative for fever.  Respiratory: Negative for shortness of breath.   Cardiovascular: Negative for chest pain.  Gastrointestinal:  Negative for abdominal pain, diarrhea, nausea and vomiting.  Genitourinary: Positive for dysuria. Negative for flank pain, genital sores, hematuria, menstrual problem, vaginal bleeding, vaginal discharge and vaginal pain.  Musculoskeletal: Negative for back pain.  Skin: Negative for rash.  Neurological: Negative for dizziness, light-headedness and headaches.     Physical Exam Triage Vital Signs ED Triage Vitals  Enc Vitals Group     BP      Pulse      Resp      Temp      Temp src      SpO2      Weight      Height      Head Circumference      Peak Flow      Pain Score      Pain Loc      Pain Edu?      Excl. in [place]?    No data found.  Updated Vital Signs BP (!) 167/107 (BP Location: Left Arm)   Pulse 76   Temp 98.1 F (36.7 C) (Oral)   Resp 20   SpO2 97%   Visual Acuity Right Eye Distance:   Left Eye Distance:   Bilateral Distance:    Right Eye Near:   Left Eye Near:    Bilateral Near:     Physical Exam Vitals and  nursing note reviewed.  Constitutional:      Appearance: She is well-developed and well-nourished.     Comments: No acute distress  HENT:     Head: Normocephalic and atraumatic.     Nose: Nose normal.  Eyes:     Conjunctiva/sclera: Conjunctivae normal.  Cardiovascular:     Rate and Rhythm: Normal rate.  Pulmonary:     Effort: Pulmonary effort is normal. No respiratory distress.  Abdominal:     General: There is no distension.  Musculoskeletal:        General: Normal range of motion.     Cervical back: Neck supple.  Skin:    General: Skin is warm and dry.  Neurological:     Mental Status: She is alert and oriented to person, place, and time.  Psychiatric:        Mood and Affect: Mood and affect normal.      UC Treatments / Results  Labs (all labs ordered are listed, but only abnormal results are displayed) Labs Reviewed  POCT URINALYSIS DIP (MANUAL ENTRY) - Abnormal; Notable for the following components:      Result Value    Clarity, UA cloudy (*)    Blood, UA large (*)    Protein Ur, POC =30 (*)    Nitrite, UA Positive (*)    Leukocytes, UA Large (3+) (*)    All other components within normal limits  URINE CULTURE  CERVICOVAGINAL ANCILLARY ONLY    EKG   Radiology No results found.  Procedures Procedures (including critical care time)  Medications Ordered in UC Medications - No data to display  Initial Impression / Assessment and Plan / UC Course  I have reviewed the triage vital signs and the nursing notes.  Pertinent labs & imaging results that were available during my care of the patient were reviewed by me and considered in my medical decision making (see chart for details).     UA with large leuks and positive nitrites, will send for culture to confirm as patient did take over-the-counter urosept, also vaginal symptoms, vaginal swab pending to further rule out any vaginal infections contributing to urinary symptoms.  Going ahead and empirically treating for UTI today with Keflex, push fluids.  Alter therapy as needed based off results.  Discussed strict return precautions. Patient verbalized understanding and is agreeable with plan.  Final Clinical Impressions(s) / UC Diagnoses   Final diagnoses:  Urinary frequency  Vaginal itching     Discharge Instructions     Urine culture and vaginal swab pending Begin Keflex twice daily for the next week to treat UTI Drink plenty of water and fluids We will call with results and change treatment if needed    ED Prescriptions    Medication Sig Dispense Auth. Provider   cephALEXin (KEFLEX) 500 MG capsule Take 1 capsule (500 mg total) by mouth 2 (two) times daily for 7 days. 14 capsule Donisha Hoch, [place] C, PA-C     PDMP not reviewed this encounter.   Janith Lima, [place] 10/03/20 [phone]

## 2020-10-03 NOTE — ED Triage Notes (Signed)
Pt presents with urinary frequency, vaginal burning, and lower abdominal pain X 4 days

## 2020-10-03 NOTE — Discharge Instructions (Addendum)
Urine culture and vaginal swab pending Begin Keflex twice daily for the next week to treat UTI Drink plenty of water and fluids We will call with results and change treatment if needed

## 2020-10-05 LAB — URINE CULTURE: Culture: >=100000 — AB

## 2020-10-06 ENCOUNTER — Telehealth (HOSPITAL_COMMUNITY): Payer: Self-pay | Admitting: Emergency Medicine

## 2020-10-06 LAB — CERVICOVAGINAL ANCILLARY ONLY
Bacterial Vaginitis (gardnerella): NEGATIVE
Candida Glabrata: NEGATIVE
Candida Vaginitis: NEGATIVE
Chlamydia: NEGATIVE
Comment: NEGATIVE
Comment: NEGATIVE
Comment: NEGATIVE
Comment: NEGATIVE
Comment: NEGATIVE
Comment: NORMAL
Neisseria Gonorrhea: NEGATIVE
Trichomonas: POSITIVE — AB

## 2020-10-06 MED ORDER — METRONIDAZOLE 500 MG PO TABS: 500.0000 mg | ORAL_TABLET | Freq: Two times a day (BID) | ORAL | 0 refills | Status: DC

## 2020-12-03 ENCOUNTER — Ambulatory Visit (INDEPENDENT_AMBULATORY_CARE_PROVIDER_SITE_OTHER): Payer: Medicaid Other | Admitting: Nurse Practitioner

## 2020-12-03 ENCOUNTER — Telehealth: Payer: Self-pay | Admitting: Nurse Practitioner

## 2020-12-03 ENCOUNTER — Other Ambulatory Visit: Payer: Self-pay

## 2020-12-03 ENCOUNTER — Encounter: Payer: Self-pay | Admitting: Nurse Practitioner

## 2020-12-03 VITALS — BP 127/80 | HR 84 | Temp 97.6°F | Ht 68.9 in | Wt 234.1 lb

## 2020-12-03 DIAGNOSIS — Z7689 Persons encountering health services in other specified circumstances: Secondary | ICD-10-CM | POA: Insufficient documentation

## 2020-12-03 DIAGNOSIS — J4520 Mild intermittent asthma, uncomplicated: Secondary | ICD-10-CM | POA: Diagnosis not present

## 2020-12-03 DIAGNOSIS — M5441 Lumbago with sciatica, right side: Secondary | ICD-10-CM

## 2020-12-03 DIAGNOSIS — Z1231 Encounter for screening mammogram for malignant neoplasm of breast: Secondary | ICD-10-CM | POA: Insufficient documentation

## 2020-12-03 DIAGNOSIS — F17209 Nicotine dependence, unspecified, with unspecified nicotine-induced disorders: Secondary | ICD-10-CM | POA: Insufficient documentation

## 2020-12-03 DIAGNOSIS — F17210 Nicotine dependence, cigarettes, uncomplicated: Secondary | ICD-10-CM | POA: Diagnosis not present

## 2020-12-03 DIAGNOSIS — G8929 Other chronic pain: Secondary | ICD-10-CM

## 2020-12-03 DIAGNOSIS — M5442 Lumbago with sciatica, left side: Secondary | ICD-10-CM

## 2020-12-03 DIAGNOSIS — M545 Low back pain, unspecified: Secondary | ICD-10-CM | POA: Insufficient documentation

## 2020-12-03 MED ORDER — CYCLOBENZAPRINE HCL 10 MG PO TABS: ORAL_TABLET | ORAL | 2 refills | Status: DC

## 2020-12-03 MED ORDER — ALBUTEROL SULFATE HFA 108 (90 BASE) MCG/ACT IN AERS: 2.0000 | INHALATION_SPRAY | Freq: Four times a day (QID) | RESPIRATORY_TRACT | 2 refills | Status: DC | PRN

## 2020-12-03 NOTE — Patient Instructions (Signed)
Asthma Attack Prevention, Adult Although you may not be able to control the fact that you have asthma, you can take actions to prevent episodes of asthma (asthma attacks). How can this condition affect me? Asthma attacks (flare ups) can cause trouble breathing, wheezing, and coughing. They may keep you from doing activities you like to do. What can increase my risk? Coming into contact with things that cause asthma symptoms (asthma triggers) can put you at risk for an asthma attack. Common asthma triggers include:  Things you are allergic to (allergens), such as: ? Dust mite and cockroach droppings. ? Pet dander. ? Mold. ? Pollen from trees and grasses. ? Food allergies. This might be a specific food or added chemicals called sulfites.  Irritants, such as: ? Weather changes including very cold, dry, or humid air. ? Smoke. This includes campfire smoke, air pollution, and tobacco smoke. ? Strong odors from aerosol sprays and fumes from perfume, candles, and household cleaners.  Other triggers, such as: ? Certain medicines. This includes NSAIDs, such as ibuprofen and aspirin. ? Viral respiratory infections (colds), including runny nose (rhinitis) or infection in the sinuses (sinusitis). ? Activity including exercise, laughing, or crying. ? Not using inhaled medicines (corticosteroids) as told. What actions can I take to prevent an asthma attack?  Stay healthy. Stay up to date on all immunizations as told by your health care provider, including the yearly flu (influenza) vaccine and pneumonia vaccine.  Many asthma attacks can be prevented by carefully following your written asthma action plan. Follow your asthma action plan Work with your health care provider to create a written asthma action plan. This plan should include:  A list of your asthma triggers and how to avoid or reduce them.  A list of symptoms that you may have during an asthma attack.  Information about which medicine  to take, when to take the medicine, and how much of the medicine to take.  Information to help you understand your peak flow measurements.  Daily actions that you can take to prevent (control) your asthma symptoms.  Contact information for your health care providers.  If you have an asthma attack, act quickly. Follow the emergency steps on your written asthma action plan. This may prevent you from needing to go to the hospital. Monitor your asthma. To do this:  Use your peak flow meter every morning and every evening for 2-3 weeks or as told by your health care provider. ? Record the results in a journal. ? A drop in your peak flow numbers on one or more days may mean that you are starting to have an asthma attack, even if you are not having symptoms.  When you have asthma symptoms, write them down in a journal.  Write down in your journal how often you need to use your fast-acting rescue inhaler. If you are using your rescue inhaler more often, it may mean that your asthma is not under control. Talk with your health care provider about adjusting your asthma treatment plan to help you prevent future asthma attacks and gain better control of your condition.   Lifestyle  Avoid or reduce contact with known outdoor allergens by staying indoors, keeping windows closed, and using air conditioning when pollen and mold counts are high.  Do not use any products that contain nicotine or tobacco, such as cigarettes, e-cigarettes, and chewing tobacco. If you need help quitting, ask your health care provider.  If you are overweight, consider a weight-loss plan.  Find  ways to cope with stress and your feelings, such as mindfulness, relaxation, or breathing exercises.  Ask your health care provider if a breathing exercise program (pulmonary rehabilitation) may be helpful to control symptoms and improve your quality of life. Medicines  Take over-the-counter and prescription medicines only as told by  your health care provider.  Do not stop taking your medicine and do not take less medicine even if you are doing well.  Let your health care provider know: ? How often you use your rescue inhaler. ? How often you have symptoms when you are taking your regular medicines. ? If you wake up at night because of asthma symptoms. ? If you have more trouble with your breathing when you exercise.   Activity  Do your normal activities as told by your health care provider. Ask your health care provider what activities are safe for you.  Some people have asthma symptoms or more asthma symptoms when they exercise. This is called exercise-induced bronchoconstriction (EIB). If you have this problem, talk with your health care provider about how to manage EIB. Some tips to follow include: ? Use your fast-acting inhaler before exercise. ? Exercise indoors if it is very cold, humid, or the pollen and mold counts are high. ? Warm up and cool down before and after exercise. ? Stop exercising right away if your asthma symptoms start or get worse. Where to find more information  Asthma and Allergy Foundation of America: www.aafa.org  Centers for Disease Control and Prevention: http://www.wolf.info/  American Lung Association: www.lung.org  National Heart, Lung, and Blood Institute: https://wilson-eaton.com/  World Health Organization: RoleLink.com.br Get help right away if:  You have followed your written asthma action plan and your symptoms are not improving. Summary  Asthma attacks (flare ups) can cause trouble breathing, wheezing, and coughing. They may keep you from doing activities you normally like to do.  Work with your health care provider to create a written asthma action plan.  Do not stop taking your medicine and do not take less medicine even if you are doing well.  Do not use any products that contain nicotine or tobacco, such as cigarettes, e-cigarettes, and chewing tobacco. If you need help quitting, ask  your health care provider. This information is not intended to replace advice given to you by your health care provider. Make sure you discuss any questions you have with your health care provider. Document Revised: 08/14/2019 Document Reviewed: 08/14/2019 Elsevier Patient Education  2021 [employer].

## 2020-12-03 NOTE — Progress Notes (Signed)
New Patient Office Visit  Subjective:  Patient ID: Kimberly Armstrong, female    DOB: 1980-12-14  Age: 40 y.o. MRN: [id number]  CC:  Chief Complaint  Patient presents with  . New Patient (Initial Visit)    HPI Kimberly Armstrong presents to establish new primary care provider. She states that she really has not had a primary care doctor or regular doctor at all since the birth of her last child. Child is about to be [age]. She states that she does not have health insurance and that has been a limiting factor in her seeking routine health care.  She has history of asthma. She states that she has no medications for this. She is afraid she is going to have an asthma flare and have no medication to treat it. She states that she does have a chronic cough. This is most severe in the mornings. It is sometimes productive of a brownish colored phlegm. She states that two weeks ago, when she was coughing, she did have a sharp pain in right side of her chest. She states this resolved on it's own and has not come back. She states that she is a smoker. She smokes about 1 pack of cigarettes per day. States that this can be worse if she is socializing.  The patient states that she has persistent lower back pain. This has been worse since she started new job in 08/2020. In May 2011, she had a microdiscectomy of L5/S1 due to severe sciatica. She states that the surgery was successful. Sciatica was relieved. She is now standing for prolonged periods of time at her new job. She states that this has exacerbated the low back pain. This radiates into her hips and her legs .she states that after coming home and going to bed, the pain is worse when she wakes up. She will sometimes take tylenol or ibuprofen to relieve the pain, but she tries to steer away from oral medications. She will use solompass patches and biofreeze. These do help a little bit, but pain generally comes right back.  The patient has not had  any routine, fasting labs done in some time. She states the last time they were done was at an Urgent Care a few years back. Was told her HDL was a little low. She was given a prescription for vitamin D3 to help bring this up. She has not been back since. She needs to have a routine physical and pap smear. She also needs to have screening mammogram.   Past Medical History:  Diagnosis Date  . Asthma     Past Surgical History:  Procedure Laterality Date  . ABDOMINAL HYSTERECTOMY N/A    [facility] 11/30/2020  . BACK SURGERY    . carpel tunnel    . CESAREAN SECTION    . CESAREAN SECTION N/A    [facility] 11/30/2020  . EYE SURGERY N/A    [facility] 11/30/2020  . PARTIAL HYSTERECTOMY    . SPINE SURGERY N/A    [facility] 11/30/2020  . TUBAL LIGATION N/A    [facility] 11/30/2020    Family History  Problem Relation Age of Onset  . Breast cancer Mother   . Heart attack Mother   . Hypertension Mother   . Diabetes Sister     Social History   Socioeconomic History  . Marital status: Married    Spouse name: Macall Mccroskey  . Number of children: 3  . Years of education: Associates degree  . Highest  education level: Associate degree: occupational, Hotel manager, or vocational program  Occupational History  . Occupation: Therapist, art Rep    Employer: [employer]  Tobacco Use  . Smoking status: Current Every Day Smoker    Packs/day: 1.00    Types: Cigarettes    Start date: 12/03/2000  . Smokeless tobacco: Never Used  Substance and Sexual Activity  . Alcohol use: Yes    Alcohol/week: 2.0 standard drinks    Types: 2 Glasses of wine per week  . Drug use: Never  . Sexual activity: Yes    Partners: Male    Birth control/protection: None  Other Topics Concern  . Not on file  Social History Narrative  . Not on file   Social Determinants of Health   Financial Resource Strain: Not on file  Food Insecurity: Not on file  Transportation Needs: Not on file  Physical Activity: Not on file   Stress: Not on file  Social Connections: Not on file  Intimate Partner Violence: Not on file    ROS Review of Systems  Constitutional: Negative for activity change, chills and fever.  HENT: Negative for congestion, postnasal drip, rhinorrhea, sinus pain and sore throat.   Eyes: Negative.   Respiratory: Positive for cough and wheezing.        The patient states that she does have history of intermittent asthma.   Cardiovascular: Negative for chest pain and palpitations.  Gastrointestinal: Negative for constipation, diarrhea, nausea and vomiting.  Endocrine: Negative.   Musculoskeletal: Positive for arthralgias, back pain and myalgias.  Skin: Negative for rash.  Neurological: Negative for dizziness, weakness and headaches.  Hematological: Negative for adenopathy.  Psychiatric/Behavioral: Negative for dysphoric mood and sleep disturbance. The patient is not nervous/anxious.   All other systems reviewed and are negative.   Objective:   Today's Vitals   12/03/20 0857  BP: 127/80  Pulse: 84  Temp: 97.6 F (36.4 C)  SpO2: 99%  Weight: 234 lb 1.6 oz (106.2 kg)  Height: 5' 8.9" (1.75 m)   Body mass index is 34.67 kg/m.   Physical Exam Vitals and nursing note reviewed.  Constitutional:      Appearance: Normal appearance. She is well-developed.  HENT:     Head: Normocephalic and atraumatic.     Nose: Nose normal.  Eyes:     Pupils: Pupils are equal, round, and reactive to light.  Cardiovascular:     Rate and Rhythm: Normal rate and regular rhythm.     Pulses: Normal pulses.     Heart sounds: Normal heart sounds.  Pulmonary:     Effort: Pulmonary effort is normal.     Breath sounds: Normal breath sounds. No wheezing.  Abdominal:     Palpations: Abdomen is soft.  Musculoskeletal:        General: Normal range of motion.     Cervical back: Normal range of motion and neck supple.  Lymphadenopathy:     Cervical: No cervical adenopathy.  Skin:    General: Skin is warm  and dry.     Capillary Refill: Capillary refill takes less than 2 seconds.  Neurological:     General: No focal deficit present.     Mental Status: She is alert and oriented to person, place, and time.  Psychiatric:        Mood and Affect: Mood normal.        Behavior: Behavior normal.        Thought Content: Thought content normal.  Judgment: Judgment normal.     Assessment & Plan:  1. Encounter to establish care Appointment today to establish primary care provider.  2. Mild intermittent asthma without complication Restart albuterol rescue inhaler. Use up to two inhalations four times daily as needed for wheezing and cough. Reassess at next visit.  - albuterol (VENTOLIN HFA) 108 (90 Base) MCG/ACT inhaler; Inhale 2 puffs into the lungs every 6 (six) hours as needed for wheezing or shortness of breath.  Dispense: 1 each; Refill: 2  3. Chronic midline low back pain with bilateral sciatica Add cyclobenzaprine 10mg  - pay take 1/2 to 1 tablet at bedtime as needed for back and muscle pain. She should continue with using heating pad and topical pain relievers as needed and as indicated  - cyclobenzaprine (FLEXERIL) 10 MG tablet; Take 1/2 to 1 tablet po QHS prn back/muscle pain.  Dispense: 30 tablet; Refill: 2  4. Moderate cigarette smoker Discussed risks of long term smoking, especially when asthma is underlying process. She states that she plans to quit in near future.   5. Encounter for screening mammogram for breast cancer Mammogram ordered today  - MM DIGITAL SCREENING BILATERAL; Future  Problem List Items Addressed This Visit      Respiratory   Asthma   Relevant Medications   albuterol (VENTOLIN HFA) 108 (90 Base) MCG/ACT inhaler     Nervous and Auditory   Chronic midline low back pain with bilateral sciatica   Relevant Medications   cyclobenzaprine (FLEXERIL) 10 MG tablet     Other   Encounter to establish care - Primary   Moderate cigarette smoker   Encounter for  screening mammogram for breast cancer   Relevant Orders   MM DIGITAL SCREENING BILATERAL      Outpatient Encounter Medications as of 12/03/2020  Medication Sig  . albuterol (VENTOLIN HFA) 108 (90 Base) MCG/ACT inhaler Inhale 2 puffs into the lungs every 6 (six) hours as needed for wheezing or shortness of breath.  . cyclobenzaprine (FLEXERIL) 10 MG tablet Take 1/2 to 1 tablet po QHS prn back/muscle pain.  . [DISCONTINUED] acetaminophen (TYLENOL) 325 MG tablet Take 650 mg by mouth every 6 (six) hours as needed for mild pain or headache.  . [DISCONTINUED] aspirin 81 MG chewable tablet Chew 81 mg by mouth daily.  . [DISCONTINUED] metroNIDAZOLE (FLAGYL) 500 MG tablet Take 1 tablet (500 mg total) by mouth 2 (two) times daily.   No facility-administered encounter medications on file as of 12/03/2020.   Time spent with the patient was approximately 45 minutes. This time included reviewing progress notes, labs, imaging studies, and discussing plan for follow up.   Follow-up: Return in about 3 months (around 03/04/2021) for physical with pap - needs FBW in next few weeks with Free t4 .   Ronnell Freshwater, NP

## 2020-12-03 NOTE — Telephone Encounter (Signed)
On patients after visit summary at checkout there was the following-   Is there a way to get her scholarship for screening mammogram as she does not health insurance.   Please advise, thanks.

## 2020-12-04 NOTE — Telephone Encounter (Signed)
Awesome! Thank you so much!

## 2020-12-04 NOTE — Telephone Encounter (Signed)
I have reached out to the Mammography scholarship program at [phone] and left a msg for them to contact me in regards to this patient. Also, I have a mammography scholarship application that needs to be filled out by patient. Patient is aware application placed at front desk for pick up and to bring back when form complete for processing. AS, CMA

## 2020-12-05 ENCOUNTER — Other Ambulatory Visit: Payer: Self-pay | Admitting: Nurse Practitioner

## 2020-12-05 DIAGNOSIS — Z0000 Encounter for general adult medical examination without abnormal findings: Secondary | ICD-10-CM

## 2020-12-08 ENCOUNTER — Telehealth: Payer: Self-pay | Admitting: Nurse Practitioner

## 2020-12-08 ENCOUNTER — Other Ambulatory Visit: Payer: Self-pay | Admitting: Nurse Practitioner

## 2020-12-08 DIAGNOSIS — Z0000 Encounter for general adult medical examination without abnormal findings: Secondary | ICD-10-CM

## 2020-12-08 NOTE — Telephone Encounter (Signed)
Patient has the mammogram scholarship fund application and would like to know whether it needs to mailed or faxed to [place] or somewhere else. Please advise, thanks.

## 2020-12-08 NOTE — Telephone Encounter (Signed)
Pt can bring form back to our office and we can fax to appropriate place. AS, CMA

## 2020-12-08 NOTE — Telephone Encounter (Signed)
Patient advised.

## 2020-12-10 ENCOUNTER — Other Ambulatory Visit: Payer: Self-pay

## 2020-12-10 ENCOUNTER — Other Ambulatory Visit: Payer: Medicaid Other

## 2020-12-10 DIAGNOSIS — Z0000 Encounter for general adult medical examination without abnormal findings: Secondary | ICD-10-CM

## 2020-12-10 DIAGNOSIS — Z1231 Encounter for screening mammogram for malignant neoplasm of breast: Secondary | ICD-10-CM

## 2020-12-11 ENCOUNTER — Telehealth: Payer: Self-pay | Admitting: Nurse Practitioner

## 2020-12-11 ENCOUNTER — Encounter: Payer: Self-pay | Admitting: Physician Assistant

## 2020-12-11 ENCOUNTER — Telehealth: Payer: Medicaid Other | Admitting: Physician Assistant

## 2020-12-11 DIAGNOSIS — J302 Other seasonal allergic rhinitis: Secondary | ICD-10-CM | POA: Diagnosis not present

## 2020-12-11 DIAGNOSIS — J45909 Unspecified asthma, uncomplicated: Secondary | ICD-10-CM | POA: Diagnosis not present

## 2020-12-11 LAB — LIPID PANEL
Chol/HDL Ratio: 3.9 ratio (ref 0.0–4.4)
Cholesterol, Total: 130 mg/dL (ref 100–199)
HDL: 33 mg/dL — ABNORMAL LOW (ref >39)
LDL Chol Calc (NIH): 76 mg/dL (ref 0–99)
Triglycerides: 115 mg/dL (ref 0–149)
VLDL Cholesterol Cal: 21 mg/dL (ref 5–40)

## 2020-12-11 LAB — CBC
Hematocrit: 47.9 % — ABNORMAL HIGH (ref 34.0–46.6)
Hemoglobin: 16.6 g/dL — ABNORMAL HIGH (ref 11.1–15.9)
MCH: 31.7 pg (ref 26.6–33.0)
MCHC: 34.7 g/dL (ref 31.5–35.7)
MCV: 91 fL (ref 79–97)
Platelets: 176 10*3/uL (ref 150–450)
RBC: 5.24 x10E6/uL (ref 3.77–5.28)
RDW: 12.4 % (ref 11.7–15.4)
WBC: 4.7 10*3/uL (ref 3.4–10.8)

## 2020-12-11 LAB — HEMOGLOBIN A1C
Est. average glucose Bld gHb Est-mCnc: 114 mg/dL
Hgb A1c MFr Bld: 5.6 % (ref 4.8–5.6)

## 2020-12-11 LAB — COMPREHENSIVE METABOLIC PANEL
ALT: 24 IU/L (ref 0–32)
AST: 18 IU/L (ref 0–40)
Albumin/Globulin Ratio: 1.6 (ref 1.2–2.2)
Albumin: 4.3 g/dL (ref 3.8–4.8)
Alkaline Phosphatase: 75 IU/L (ref 44–121)
BUN/Creatinine Ratio: 10 (ref 9–23)
BUN: 8 mg/dL (ref 6–24)
Bilirubin Total: 0.5 mg/dL (ref 0.0–1.2)
CO2: 20 mmol/L (ref 20–29)
Calcium: 9.3 mg/dL (ref 8.7–10.2)
Chloride: 105 mmol/L (ref 96–106)
Creatinine, Ser: 0.82 mg/dL (ref 0.57–1.00)
Globulin, Total: 2.7 g/dL (ref 1.5–4.5)
Glucose: 93 mg/dL (ref 65–99)
Potassium: 4.1 mmol/L (ref 3.5–5.2)
Sodium: 138 mmol/L (ref 134–144)
Total Protein: 7 g/dL (ref 6.0–8.5)
eGFR: 92 mL/min/{1.73_m2} (ref >59)

## 2020-12-11 LAB — TSH: TSH: 1.89 u[IU]/mL (ref 0.450–4.500)

## 2020-12-11 LAB — T4, FREE: Free T4: 1.14 ng/dL (ref 0.82–1.77)

## 2020-12-11 MED ORDER — CETIRIZINE HCL 10 MG PO TABS: 10.0000 mg | ORAL_TABLET | Freq: Every day | ORAL | 0 refills | Status: DC

## 2020-12-11 MED ORDER — MONTELUKAST SODIUM 10 MG PO TABS: 10.0000 mg | ORAL_TABLET | Freq: Every day | ORAL | 3 refills | Status: DC

## 2020-12-11 MED ORDER — PREDNISONE 50 MG PO TABS: 50.0000 mg | ORAL_TABLET | Freq: Every day | ORAL | 0 refills | Status: AC

## 2020-12-11 MED ORDER — FLUTICASONE PROPIONATE 50 MCG/ACT NA SUSP: 2.0000 | Freq: Every day | NASAL | 0 refills | Status: DC

## 2020-12-11 NOTE — Telephone Encounter (Signed)
patient has asthma, difficulty breathing, no other symptoms. patient was provided urgent care number [phone]- there are no available appointments this morning. Please follow up with patient as to her next actions.

## 2020-12-11 NOTE — Progress Notes (Signed)
Resulted labs look good. Will discuss with patient at her next visit.

## 2020-12-11 NOTE — Patient Instructions (Signed)
Allergies, Adult An allergy is a condition in which the body's defense system (immune system) comes in contact with an allergen and reacts to it. An allergen is anything that causes an allergic reaction. Allergens cause the immune system to make proteins for fighting infections (antibodies). These antibodies cause cells to release chemicals called histamines that set off the symptoms of an allergic reaction. Allergies often affect the nasal passages (allergic rhinitis), eyes (allergic conjunctivitis), skin (atopic dermatitis), and stomach. Allergies can be mild, moderate, or severe. They cannot spread from person to person. Allergies can develop at any age and may be outgrown. What are the causes? This condition is caused by allergens. Common allergens include:  Outdoor allergens, such as pollen, car fumes, and mold.  Indoor allergens, such as dust, smoke, mold, and pet dander.  Other allergens, such as foods, medicines, scents, insect bites or stings, and other skin irritants. What increases the risk? You are more likely to develop this condition if you have:  Family members with allergies.  Family members who have any condition that may be caused by allergens, such as asthma. This may make you more likely to have other allergies. What are the signs or symptoms? Symptoms of this condition depend on the severity of the allergy. Mild to moderate symptoms  Runny nose, stuffy nose (nasal congestion), or sneezing.  Itchy mouth, ears, or throat.  A feeling of mucus dripping down the back of your throat (postnasal drip).  Sore throat.  Itchy, red, watery, or puffy eyes.  Skin rash, or itchy, red, swollen areas of skin (hives).  Stomach cramps or bloating. Severe symptoms Severe allergies to food, medicine, or insect bites may cause anaphylaxis, which can be life-threatening. Symptoms include:  A red (flushed) face.  Wheezing or coughing.  Swollen lips, tongue, or mouth.  Tight or  swollen throat.  Chest pain or tightness, or rapid heartbeat.  Trouble breathing or shortness of breath.  Pain in the abdomen, vomiting, or diarrhea.  Dizziness or fainting. How is this diagnosed? This condition is diagnosed based on your symptoms, your family and medical history, and a physical exam. You may also have tests, including:  Skin tests to see how your skin reacts to allergens that may be causing your symptoms. Tests include: ? Skin prick test. For this test, an allergen is introduced to your body through a small opening in the skin. ? Intradermal skin test. For this test, a small amount of allergen is injected under the first layer of your skin. ? Patch test. For this test, a small amount of allergen is placed on your skin. The area is covered and then checked after a few days.  Blood tests.  A challenge test. For this test, you will eat or breathe in a small amount of allergen to see if you have an allergic reaction. You may also be asked to:  Keep a food diary. This is a record of all the foods, drinks, and symptoms you have in a day.  Try an elimination diet. To do this: ? Remove certain foods from your diet. ? Add those foods back one by one to find out if any foods cause an allergic reaction. How is this treated? Treatment for allergies depends on your symptoms. Treatment may include:  Cold, wet cloths (cold compresses) to soothe itching and swelling.  Eye drops or nasal sprays.  Nasal irrigation to help clear your mucus or keep the nasal passages moist.  A humidifier to add moisture to the  air.  Skin creams to treat rashes or itching.  Oral antihistamines or other medicines to block the reaction or to treat inflammation.  Diet changes to remove foods that cause allergies.  Being exposed again and again to tiny amounts of allergens to help you build a defense against it (tolerance). This is called immunotherapy. Examples include: ? Allergy shot. You  receive an injection that contains an allergen. ? Sublingual immunotherapy. You take a small dose of allergen under your tongue.  Emergency injection for anaphylaxis. You give yourself a shot using a syringe (auto-injector) that contains the amount of medicine you need. Your health care provider will teach you how to give yourself an injection.      Follow these instructions at home: Medicines  Take or apply over-the-counter and prescription medicines only as told by your health care provider.  Always carry your auto-injector pen if you are at risk of anaphylaxis. Give yourself an injection as told by your health care provider.   Eating and drinking  Follow instructions from your health care provider about eating or drinking restrictions.  Drink enough fluid to keep your urine pale yellow. General instructions  Wear a medical alert bracelet or necklace to let others know that you have had anaphylaxis before.  Avoid known allergens whenever possible.  Keep all follow-up visits as told by your health care provider. This is important. Contact a health care provider if:  Your symptoms do not get better with treatment. Get help right away if:  You have symptoms of anaphylaxis. These include: ? Swollen mouth, tongue, or throat. ? Pain or tightness in your chest. ? Trouble breathing or shortness of breath. ? Dizziness or fainting. ? Severe abdominal pain, vomiting, or diarrhea. These symptoms may represent a serious problem that is an emergency. Do not wait to see if the symptoms will go away. Get medical help right away. Call your local emergency services (911 in the [place] Do not drive yourself to the hospital. Summary  Take or apply over-the-counter and prescription medicines only as told by your health care provider.  Avoid known allergens when possible.  Always carry your auto-injector pen if you are at risk of anaphylaxis. Give yourself an injection as told by your health  care provider.  Wear a medical alert bracelet or necklace to let others know that you have had anaphylaxis before.  Anaphylaxis is a life-threatening emergency. Get help right away. This information is not intended to replace advice given to you by your health care provider. Make sure you discuss any questions you have with your health care provider. Document Revised: 04/14/2020 Document Reviewed: 06/27/2019 Elsevier Patient Education  2021 [employer]. http://www.aaaai.org/conditions-and-treatments/asthma">  Asthma, Adult  Asthma is a long-term (chronic) condition that causes recurrent episodes in which the airways become tight and narrow. The airways are the passages that lead from the nose and mouth down into the lungs. Asthma episodes, also called asthma attacks, can cause coughing, wheezing, shortness of breath, and chest pain. The airways can also fill with mucus. During an attack, it can be difficult to breathe. Asthma attacks can range from minor to life threatening. Asthma cannot be cured, but medicines and lifestyle changes can help control it and treat acute attacks. What are the causes? This condition is believed to be caused by inherited (genetic) and environmental factors, but its exact cause is not known. There are many things that can bring on an asthma attack or make asthma symptoms worse (triggers). Asthma triggers are different  for each person. Common triggers include:  Mold.  Dust.  Cigarette smoke.  Cockroaches.  Things that can cause allergy symptoms (allergens), such as animal dander or pollen from trees or grass.  Air pollutants such as household cleaners, wood smoke, smog, or [employer] account planner.  Cold air, weather changes, and winds (which increase molds and pollen in the air).  Strong emotional expressions such as crying or laughing hard.  Stress.  Certain medicines (such as aspirin) or types of medicines (such as beta-blockers).  Sulfites in foods and  drinks. Foods and drinks that may contain sulfites include dried fruit, potato chips, and sparkling grape juice.  Infections or inflammatory conditions such as the flu, a cold, or inflammation of the nasal membranes (rhinitis).  Gastroesophageal reflux disease (GERD).  Exercise or strenuous activity. What are the signs or symptoms? Symptoms of this condition may occur right after asthma is triggered or many hours later. Symptoms include:  Wheezing. This can sound like whistling when you breathe.  Excessive nighttime or early morning coughing.  Frequent or severe coughing with a common cold.  Chest tightness.  Shortness of breath.  Tiredness (fatigue) with minimal activity. How is this diagnosed? This condition is diagnosed based on:  Your medical history.  A physical exam.  Tests, which may include: ? Lung function studies and pulmonary studies (spirometry). These tests can evaluate the flow of air in your lungs. ? Allergy tests. ? Imaging tests, such as X-rays. How is this treated? There is no cure for this condition, but treatment can help control your symptoms. Treatment for asthma usually involves:  Identifying and avoiding your asthma triggers.  Using medicines to control your symptoms. Generally, two types of medicines are used to treat asthma: ? Controller medicines. These help prevent asthma symptoms from occurring. They are usually taken every day. ? Fast-acting reliever or rescue medicines. These quickly relieve asthma symptoms by widening the narrow and tight airways. They are used as needed and provide short-term relief.  Using supplemental oxygen. This may be needed during a severe episode.  Using other medicines, such as: ? Allergy medicines, such as antihistamines, if your asthma attacks are triggered by allergens. ? Immune medicines (immunomodulators). These are medicines that help control the immune system.  Creating an asthma action plan. An asthma  action plan is a written plan for managing and treating your asthma attacks. This plan includes: ? A list of your asthma triggers and how to avoid them. ? Information about when medicines should be taken and when their dosage should be changed. ? Instructions about using a device called a peak flow meter. A peak flow meter measures how well the lungs are working and the severity of your asthma. It helps you monitor your condition. Follow these instructions at home: Controlling your home environment Control your home environment in the following ways to help avoid triggers and prevent asthma attacks:  Change your heating and air conditioning filter regularly.  Limit your use of fireplaces and wood stoves.  Get rid of pests (such as roaches and mice) and their droppings.  Throw away plants if you see mold on them.  Clean floors and dust surfaces regularly. Use unscented cleaning products.  Try to have someone else vacuum for you regularly. Stay out of rooms while they are being vacuumed and for a short while afterward. If you vacuum, use a dust mask from a hardware store, a double-layered or microfilter vacuum cleaner bag, or a vacuum cleaner with a HEPA filter.  Replace carpet with wood, tile, or vinyl flooring. Carpet can trap dander and dust.  Use allergy-proof pillows, mattress covers, and box spring covers.  Keep your bedroom a trigger-free room.  Avoid pets and keep windows closed when allergens are in the air.  Wash beddings every week in hot water and dry them in a dryer.  Use blankets that are made of polyester or cotton.  Clean bathrooms and kitchens with bleach. If possible, have someone repaint the walls in these rooms with mold-resistant paint. Stay out of the rooms that are being cleaned and painted.  Wash your hands often with soap and water. If soap and water are not available, use hand sanitizer.  Do not allow anyone to smoke in your home. General  instructions  Take over-the-counter and prescription medicines only as told by your health care provider. ? Speak with your health care provider if you have questions about how or when to take the medicines. ? Make note if you are requiring more frequent dosages.  Do not use any products that contain nicotine or tobacco, such as cigarettes and e-cigarettes. If you need help quitting, ask your health care provider. Also, avoid being exposed to secondhand smoke.  Use a peak flow meter as told by your health care provider. Record and keep track of the readings.  Understand and use the asthma action plan to help minimize, or stop an asthma attack, without needing to seek medical care.  Make sure you stay up to date on your yearly vaccinations as told by your health care provider. This may include vaccines for the flu and pneumonia.  Avoid outdoor activities when allergen counts are high and when air quality is low.  Wear a ski mask that covers your nose and mouth during outdoor winter activities. Exercise indoors on cold days if you can.  Warm up before exercising, and take time for a cool-down period after exercise.  Keep all follow-up visits as told by your health care provider. This is important. Where to find more information  For information about asthma, turn to the Centers for Disease Control and Prevention at http://www.clark.net/  For air quality information, turn to [employer] at https://www.miller-reyes.info/ Contact a health care provider if:  You have wheezing, shortness of breath, or a cough even while you are taking medicine to prevent attacks.  The mucus you cough up (sputum) is thicker than usual.  Your sputum changes from clear or white to yellow, green, gray, or bloody.  Your medicines are causing side effects, such as a rash, itching, swelling, or trouble breathing.  You need to use a reliever medicine more than 2-3 times a week.  Your peak flow reading is still at 50-79% of your  personal best after following your action plan for 1 hour.  You have a fever. Get help right away if:  You are getting worse and do not respond to treatment during an asthma attack.  You are short of breath when at rest or when doing very little physical activity.  You have difficulty eating, drinking, or talking.  You have chest pain or tightness.  You develop a fast heartbeat or palpitations.  You have a bluish color to your lips or fingernails.  You are light-headed or dizzy, or you faint.  Your peak flow reading is less than 50% of your personal best.  You feel too tired to breathe normally. Summary  Asthma is a long-term (chronic) condition that causes recurrent episodes in which the airways become tight  and narrow. These episodes can cause coughing, wheezing, shortness of breath, and chest pain.  Asthma cannot be cured, but medicines and lifestyle changes can help control it and treat acute attacks.  Make sure you understand how to avoid triggers and how and when to use your medicines.  Asthma attacks can range from minor to life threatening. Get help right away if you have an asthma attack and do not respond to treatment with your usual rescue medicines. This information is not intended to replace advice given to you by your health care provider. Make sure you discuss any questions you have with your health care provider. Document Revised: 05/16/2020 Document Reviewed: 12/19/2019 Elsevier Patient Education  2021 [employer].

## 2020-12-11 NOTE — Telephone Encounter (Signed)
I gave her new prescription for rescue inhaler when she was seen at first visit. She probably needs breathing treatment and steroid injection. She should go to urgent care. They may also do chest x-ray there.

## 2020-12-11 NOTE — Progress Notes (Signed)
Kimberly Armstrong are scheduled for a virtual visit with your provider today.    Just as we do with appointments in the office, we must obtain your consent to participate.  Your consent will be active for this visit and any virtual visit you may have with one of our providers in the next 365 days.    If you have a MyChart account, I can also send a copy of this consent to you electronically.  All virtual visits are billed to your insurance company just like a traditional visit in the office.  As this is a virtual visit, video technology does not allow for your provider to perform a traditional examination.  This may limit your provider's ability to fully assess your condition.  If your provider identifies any concerns that need to be evaluated in person or the need to arrange testing such as labs, EKG, etc, we will make arrangements to do so.    Although advances in technology are sophisticated, we cannot ensure that it will always work on either your end or our end.  If the connection with a video visit is poor, we may have to switch to a telephone visit.  With either a video or telephone visit, we are not always able to ensure that we have a secure connection.   I need to obtain your verbal consent now.   Are you willing to proceed with your visit today?   Kimberly Armstrong has provided verbal consent on 12/11/2020 for a virtual visit (video or telephone).   Kimberly Booze, PA-C 12/11/2020  8:46 AM   [name],you are scheduled for a virtual visit with your provider today.    Just as we do with appointments in the office, we must obtain your consent to participate.  Your consent will be active for this visit and any virtual visit you may have with one of our providers in the next 365 days.    If you have a MyChart account, I can also send a copy of this consent to you electronically.  All virtual visits are billed to your insurance company just like a traditional visit in the office.  As  this is a virtual visit, video technology does not allow for your provider to perform a traditional examination.  This may limit your provider's ability to fully assess your condition.  If your provider identifies any concerns that need to be evaluated in person or the need to arrange testing such as labs, EKG, etc, we will make arrangements to do so.    Although advances in technology are sophisticated, we cannot ensure that it will always work on either your end or our end.  If the connection with a video visit is poor, we may have to switch to a telephone visit.  With either a video or telephone visit, we are not always able to ensure that we have a secure connection.   I need to obtain your verbal consent now.   Are you willing to proceed with your visit today?   Kimberly Armstrong has provided verbal consent on 12/11/2020 for a virtual visit (video or telephone).   Kimberly Booze, PA-C 12/11/2020  8:46 AM   Date:  12/11/2020   ID:  Kimberly Armstrong [birth year], MRN [id number]  Patient Location: Home Provider Location: Home Office   Participants: Patient and Provider for Visit and Wrap up  Method of visit: Video  Location of Patient: Home Location of Provider: Home Office Consent  was obtain for visit over the video. Services rendered by provider: Visit was performed via video  A video enabled telemedicine application was used and I verified that I am speaking with the correct person using two identifiers.  PCP:  Ronnell Freshwater, NP   Chief Complaint:  Asthma, seasonal allergies  History of Present Illness:    Kimberly Armstrong is a 40 y.o. female with history as stated below. Presents video telehealth for an acute care visit  40 y/o F with a h/o asthma, seasonal allergies that started several days ago.  Reports that she has had a cough, itchy eyes, rhinorrhea, congestion, itchy/raw throat. She has tried drinking warm fluids with honey, using  vix, and her inhaler without resolution of symptoms.   States today she has a pain in her chest when she coughs and when breathes in. She denies any persistent chest pain and states that she has had similar sxs with her past asthma flares. She has had some shortness of breath when she coughs a lot or when she talks too much but does not have any SOB at rest. She has never been admitted to the hospital or intubated for her asthma.   Denies persistent leg pain/swelling, hemoptysis, recent surgery/trauma, recent long travel, hormone use, personal hx of cancer, or hx of DVT/PE.   Pt states she was previously on Montelukast, cetirizine and fluticasone and her allergies were less severe. She states she strongly feels that her current symptoms are related to her allergies.  Past Medical, Surgical, Social History, Allergies, and Medications have been Reviewed.  Past Medical History:  Diagnosis Date  . Asthma     Current Meds  Medication Sig  . cetirizine (ZYRTEC) 10 MG tablet Take 1 tablet (10 mg total) by mouth daily.  . fluticasone (FLONASE) 50 MCG/ACT nasal spray Place 2 sprays into both nostrils daily.  . montelukast (SINGULAIR) 10 MG tablet Take 1 tablet (10 mg total) by mouth at bedtime.  . predniSONE (DELTASONE) 50 MG tablet Take 1 tablet (50 mg total) by mouth daily for 5 days.     Allergies:   Patient has no known allergies.   ROS See HPI for history of present illness.  Physical Exam Constitutional:      General: She is not in acute distress.    Appearance: She is not ill-appearing or toxic-appearing.  HENT:     Head: Normocephalic.  Eyes:     Conjunctiva/sclera: Conjunctivae normal.  Pulmonary:     Effort: Pulmonary effort is normal.     Comments: Pt has dry cough on exam. She has no tachypnea and does not appear dyspneic. She is able to speak in full paragraphs without difficulty.  Musculoskeletal:        General: Normal range of motion.     Cervical back: Neck supple.   Neurological:     Mental Status: She is alert.     Comments: alert     MDM: Pt c/o asthma flare and seasonal allergies. She is well appearing on exam and is in NAD. Will give rx for her previous allergies medications as well as give her an rx for prednisone for her asthma. Have advised that if she does not have improvement over the next 24 hours she will need to be seen in person. Pt voices understanding and is comfortable with the plan.          There are no diagnoses linked to this encounter.   Time:   Today, I  have spent 24 minutes with the patient with telehealth technology discussing the above problems, reviewing the chart, previous notes, medications and orders.    Tests Ordered: No orders of the defined types were placed in this encounter.   Medication Changes: Meds ordered this encounter  Medications  . montelukast (SINGULAIR) 10 MG tablet    Sig: Take 1 tablet (10 mg total) by mouth at bedtime.    Dispense:  30 tablet    Refill:  3    Order Specific Question:   Supervising Provider    Answer:   Sabra Heck, BRIAN [[id number]]  . cetirizine (ZYRTEC) 10 MG tablet    Sig: Take 1 tablet (10 mg total) by mouth daily.    Dispense:  30 tablet    Refill:  0    Order Specific Question:   Supervising Provider    Answer:   MILLER, BRIAN [[id number]]  . fluticasone (FLONASE) 50 MCG/ACT nasal spray    Sig: Place 2 sprays into both nostrils daily.    Dispense:  16 g    Refill:  0    Order Specific Question:   Supervising Provider    Answer:   MILLER, BRIAN [[id number]]  . predniSONE (DELTASONE) 50 MG tablet    Sig: Take 1 tablet (50 mg total) by mouth daily for 5 days.    Dispense:  5 tablet    Refill:  0    Order Specific Question:   Supervising Provider    Answer:   Noemi Chapel [[id number]]     Disposition:  Follow up  Signed, Kimberly Booze, PA-C  12/11/2020 8:46 AM

## 2020-12-19 ENCOUNTER — Ambulatory Visit
Admission: EM | Admit: 2020-12-19 | Discharge: 2020-12-19 | Disposition: A | Discharge status: Home / Self Care | Payer: Medicaid Other | Attending: Family Medicine | Admitting: Family Medicine

## 2020-12-19 ENCOUNTER — Other Ambulatory Visit: Payer: Self-pay

## 2020-12-19 DIAGNOSIS — Z20822 Contact with and (suspected) exposure to covid-19: Secondary | ICD-10-CM

## 2020-12-19 DIAGNOSIS — J4541 Moderate persistent asthma with (acute) exacerbation: Secondary | ICD-10-CM

## 2020-12-19 DIAGNOSIS — Z1152 Encounter for screening for COVID-19: Secondary | ICD-10-CM | POA: Diagnosis not present

## 2020-12-19 MED ORDER — PROMETHAZINE-DM 6.25-15 MG/5ML PO SYRP: 5.0000 mL | ORAL_SOLUTION | Freq: Four times a day (QID) | ORAL | 0 refills | Status: DC | PRN

## 2020-12-19 MED ORDER — BUDESONIDE-FORMOTEROL FUMARATE 160-4.5 MCG/ACT IN AERO: 2.0000 | INHALATION_SPRAY | Freq: Two times a day (BID) | RESPIRATORY_TRACT | 1 refills | Status: DC

## 2020-12-19 MED ORDER — DEXAMETHASONE SODIUM PHOSPHATE 10 MG/ML IJ SOLN
10.0000 mg | Freq: Once | INTRAMUSCULAR | Status: AC
  Administered 2020-12-19: 10 mg via INTRAMUSCULAR

## 2020-12-19 NOTE — ED Provider Notes (Signed)
[name]    [name]: [id number] Arrival date & time: 12/19/20  0831      History   Chief Complaint Chief Complaint  Patient presents with  . Covid Exposure    HPI Kimberly Armstrong is a 40 y.o. female.   Patient presenting today with several hour history of chills, body aches, cough, congestion, chest tightness, sore throat.  She states that she recently had a COVID-positive exposure and would like to be tested for this.  She has been having issues with her allergies and asthma the past few weeks, just completed a prednisone course and started on Singulair and Flonase through PCP, finished the prednisone on Sunday.  Also has a ProAir inhaler that she uses as needed which does seem to help her chest tightness.  Denies known fever, shortness of breath, abdominal pain, nausea vomiting diarrhea.     Past Medical History:  Diagnosis Date  . Asthma     Patient Active Problem List   Diagnosis Date Noted  . Encounter to establish care 12/03/2020  . Chronic midline low back pain with bilateral sciatica 12/03/2020  . Moderate cigarette smoker 12/03/2020  . Encounter for screening mammogram for breast cancer 12/03/2020  . Asthma Jun 08, 1981    Past Surgical History:  Procedure Laterality Date  . ABDOMINAL HYSTERECTOMY N/A    [facility]reesia 11/30/2020  . BACK SURGERY    . carpel tunnel    . CESAREAN SECTION    . CESAREAN SECTION N/A    [facility]reesia 11/30/2020  . EYE SURGERY N/A    [facility]reesia 11/30/2020  . PARTIAL HYSTERECTOMY    . SPINE SURGERY N/A    [facility]reesia 11/30/2020  . TUBAL LIGATION N/A    [facility]reesia 11/30/2020    OB History   No obstetric history on file.      Home Medications    Prior to Admission medications   Medication Sig Start Date End Date Taking? Authorizing Provider  budesonide-formoterol (SYMBICORT) 160-4.5 MCG/ACT inhaler Inhale 2 puffs into the lungs 2 (two) times daily. 12/19/20  Yes Volney American, PA-C   promethazine-dextromethorphan (PROMETHAZINE-DM) 6.25-15 MG/5ML syrup Take 5 mLs by mouth 4 (four) times daily as needed for cough. 12/19/20  Yes Volney American, PA-C  albuterol (VENTOLIN HFA) 108 (90 Base) MCG/ACT inhaler Inhale 2 puffs into the lungs every 6 (six) hours as needed for wheezing or shortness of breath. 12/03/20   Ronnell Freshwater, NP  cetirizine (ZYRTEC) 10 MG tablet Take 1 tablet (10 mg total) by mouth daily. 12/11/20   Couture, Cortni S, PA-C  cyclobenzaprine (FLEXERIL) 10 MG tablet Take 1/2 to 1 tablet po QHS prn back/muscle pain. 12/03/20   Ronnell Freshwater, NP  fluticasone (FLONASE) 50 MCG/ACT nasal spray Place 2 sprays into both nostrils daily. 12/11/20   Couture, Cortni S, PA-C  montelukast (SINGULAIR) 10 MG tablet Take 1 tablet (10 mg total) by mouth at bedtime. 12/11/20   Couture, Cortni S, PA-C    Family History Family History  Problem Relation Age of Onset  . Breast cancer Mother   . Heart attack Mother   . Hypertension Mother   . Diabetes Sister     Social History Social History   Tobacco Use  . Smoking status: Current Every Day Smoker    Packs/day: 1.00    Types: Cigarettes    Start date: 12/03/2000  . Smokeless tobacco: Never Used  Substance Use Topics  . Alcohol use: Yes    Alcohol/week: 2.0 standard drinks  Types: 2 Glasses of wine per week  . Drug use: Never     Allergies   Patient has no known allergies.   Review of Systems Review of Systems Per HPI  Physical Exam Triage Vital Signs ED Triage Vitals [12/19/20 0844]  Enc Vitals Group     BP (!) 150/99     Pulse Rate 82     Resp 16     Temp 98 F (36.7 C)     Temp Source Oral     SpO2 95 %     Weight      Height      Head Circumference      Peak Flow      Pain Score 7     Pain Loc      Pain Edu?      Excl. in [place]?    No data found.  Updated Vital Signs BP (!) 150/99 (BP Location: Left Arm)   Pulse 82   Temp 98 F (36.7 C) (Oral)   Resp 16   SpO2 95%   Visual  Acuity Right Eye Distance:   Left Eye Distance:   Bilateral Distance:    Right Eye Near:   Left Eye Near:    Bilateral Near:     Physical Exam Vitals and nursing note reviewed.  Constitutional:      Appearance: Normal appearance. She is not ill-appearing.  HENT:     Head: Atraumatic.     Right Ear: Tympanic membrane normal.     Left Ear: Tympanic membrane normal.     Nose: Rhinorrhea present. No congestion.     Mouth/Throat:     Mouth: Mucous membranes are moist.     Pharynx: Posterior oropharyngeal erythema present. No oropharyngeal exudate.  Eyes:     Extraocular Movements: Extraocular movements intact.     Conjunctiva/sclera: Conjunctivae normal.  Cardiovascular:     Rate and Rhythm: Normal rate and regular rhythm.     Heart sounds: Normal heart sounds.  Pulmonary:     Effort: Pulmonary effort is normal. No respiratory distress.     Breath sounds: Wheezing present. No rales.     Comments: Moderate diffuse wheezes Abdominal:     General: Bowel sounds are normal. There is no distension.     Palpations: Abdomen is soft.     Tenderness: There is no abdominal tenderness. There is no guarding.  Musculoskeletal:        General: Normal range of motion.     Cervical back: Normal range of motion and neck supple.  Skin:    General: Skin is warm and dry.  Neurological:     Mental Status: She is alert and oriented to person, place, and time.  Psychiatric:        Mood and Affect: Mood normal.        Thought Content: Thought content normal.        Judgment: Judgment normal.      UC Treatments / Results  Labs (all labs ordered are listed, but only abnormal results are displayed) Labs Reviewed  NOVEL CORONAVIRUS, NAA    EKG   Radiology No results found.  Procedures Procedures (including critical care time)  Medications Ordered in UC Medications  dexamethasone (DECADRON) injection 10 mg (10 mg Intramuscular Given 12/19/20 0909)    Initial Impression / Assessment  and Plan / UC Course  I have reviewed the triage vital signs and the nursing notes.  Pertinent labs & imaging results that were available  during my care of the patient were reviewed by me and considered in my medical decision making (see chart for details).     Vital signs reassuring today, COVID PCR pending.  Suspect compounding symptoms from uncontrolled asthma/allergies and likely new viral illness with suspected COVID given exposure.  Isolation reviewed, work note given.  Will give IM Decadron in clinic and start Symbicort inhaler daily in addition to her allergy and as needed ProAir.  Phenergan DM given for cough.  Discussed over-the-counter supportive medications at home care.  Return for acutely worsening symptoms at any time.  Final Clinical Impressions(s) / UC Diagnoses   Final diagnoses:  Encounter for screening for COVID-19  Moderate persistent asthma with acute exacerbation  Exposure to COVID-19 virus   Discharge Instructions   None    ED Prescriptions    Medication Sig Dispense Auth. Provider   budesonide-formoterol (SYMBICORT) 160-4.5 MCG/ACT inhaler Inhale 2 puffs into the lungs 2 (two) times daily. 1 each Volney American, PA-C   promethazine-dextromethorphan (PROMETHAZINE-DM) 6.25-15 MG/5ML syrup Take 5 mLs by mouth 4 (four) times daily as needed for cough. 100 mL Volney American, [place]     PDMP not reviewed this encounter.   Merrie Roof [place], [place] 12/19/20 [phone]

## 2020-12-19 NOTE — ED Triage Notes (Signed)
Pt present covid exposure from family member. Pt developed symptoms yesterday. Pt states having issue with SOB, chills and bodyaches

## 2020-12-20 LAB — NOVEL CORONAVIRUS, NAA: SARS-CoV-2, NAA: NOT DETECTED

## 2020-12-20 LAB — SARS-COV-2, NAA 2 DAY TAT

## 2021-01-16 ENCOUNTER — Other Ambulatory Visit: Payer: Self-pay | Admitting: Nurse Practitioner

## 2021-01-30 ENCOUNTER — Telehealth: Payer: Medicaid Other | Admitting: Family

## 2021-01-30 ENCOUNTER — Encounter: Payer: Self-pay | Admitting: Family

## 2021-01-30 DIAGNOSIS — M545 Low back pain, unspecified: Secondary | ICD-10-CM

## 2021-01-30 MED ORDER — DICLOFENAC SODIUM 75 MG PO TBEC: 75.0000 mg | DELAYED_RELEASE_TABLET | Freq: Two times a day (BID) | ORAL | 0 refills | Status: DC

## 2021-01-30 MED ORDER — BACLOFEN 10 MG PO TABS: 10.0000 mg | ORAL_TABLET | Freq: Three times a day (TID) | ORAL | 0 refills | Status: DC

## 2021-01-30 NOTE — Progress Notes (Signed)
   Virtual Visit  Note Due to COVID-19 pandemic this visit was conducted virtually. This visit type was conducted due to national recommendations for restrictions regarding the COVID-19 Pandemic (e.g. social distancing, sheltering in place) in an effort to limit this patient's exposure and mitigate transmission in our community. All issues noted in this document were discussed and addressed.  A physical exam was not performed with this format.  I connected with Kimberly Armstrong on 01/30/21 at 3:37 pm by telephone and verified that I am speaking with the correct person using two identifiers. Kimberly Armstrong is currently located at home and no one is currently with her during visit. The provider, Evelina Dun, FNP is located in their office at time of visit.  I discussed the limitations, risks, security and privacy concerns of performing an evaluation and management service by telephone and the availability of in person appointments. I also discussed with the patient that there may be a patient responsible charge related to this service. The patient expressed understanding and agreed to proceed.   History and Present Illness:  Back Pain This is a new problem. The current episode started yesterday. The problem occurs intermittently. The problem has been waxing and waning since onset. The pain is present in the lumbar spine. The quality of the pain is described as aching. The pain is at a severity of 8/10. The pain is moderate. The symptoms are aggravated by bending and standing. Pertinent negatives include no numbness or perianal numbness.      Review of Systems  Musculoskeletal: Positive for back pain.  Neurological: Negative for numbness.  All other systems reviewed and are negative.    Observations/Objective: No SOB or distress noted  Assessment and Plan: 1. Acute left-sided low back pain without sciatica Rest ROM exercises  No other NSAID's while taking  diclofenac Sedation precautions  Follow up if symptoms worsen or do not improve  - diclofenac (VOLTAREN) 75 MG EC tablet; Take 1 tablet (75 mg total) by mouth 2 (two) times daily.  Dispense: 30 tablet; Refill: 0 - baclofen (LIORESAL) 10 MG tablet; Take 1 tablet (10 mg total) by mouth 3 (three) times daily.  Dispense: 30 each; Refill: 0      I discussed the assessment and treatment plan with the patient. The patient was provided an opportunity to ask questions and all were answered. The patient agreed with the plan and demonstrated an understanding of the instructions.   The patient was advised to call back or seek an in-person evaluation if the symptoms worsen or if the condition fails to improve as anticipated.  The above assessment and management plan was discussed with the patient. The patient verbalized understanding of and has agreed to the management plan. Patient is aware to call the clinic if symptoms persist or worsen. Patient is aware when to return to the clinic for a follow-up visit. Patient educated on when it is appropriate to go to the emergency department.   Time call ended:  3:54 pm   I provided 17 minutes of   face-to-face time during this encounter.    Evelina Dun, FNP

## 2021-02-03 ENCOUNTER — Other Ambulatory Visit: Payer: Self-pay | Admitting: Family

## 2021-02-03 ENCOUNTER — Ambulatory Visit: Payer: Medicaid Other | Admitting: Nurse Practitioner

## 2021-02-03 MED ORDER — NAPROXEN 500 MG PO TABS: 500.0000 mg | ORAL_TABLET | Freq: Two times a day (BID) | ORAL | 0 refills | Status: DC

## 2021-03-05 ENCOUNTER — Ambulatory Visit (INDEPENDENT_AMBULATORY_CARE_PROVIDER_SITE_OTHER): Payer: Managed Care, Other (non HMO) | Admitting: Nurse Practitioner

## 2021-03-05 ENCOUNTER — Encounter: Payer: Self-pay | Admitting: Nurse Practitioner

## 2021-03-05 ENCOUNTER — Other Ambulatory Visit: Payer: Self-pay

## 2021-03-05 ENCOUNTER — Other Ambulatory Visit (HOSPITAL_COMMUNITY)
Admission: RE | Admit: 2021-03-05 | Discharge: 2021-03-05 | Disposition: A | Discharge status: Home / Self Care | Payer: Medicaid Other | Source: Ambulatory Visit | Attending: Nurse Practitioner | Admitting: Nurse Practitioner

## 2021-03-05 ENCOUNTER — Encounter: Payer: Medicaid Other | Admitting: Nurse Practitioner

## 2021-03-05 VITALS — BP 139/90 | HR 78 | Temp 97.9°F | Ht 68.9 in | Wt 225.3 lb

## 2021-03-05 DIAGNOSIS — Z6833 Body mass index (BMI) 33.0-33.9, adult: Secondary | ICD-10-CM

## 2021-03-05 DIAGNOSIS — Z01419 Encounter for gynecological examination (general) (routine) without abnormal findings: Secondary | ICD-10-CM

## 2021-03-05 DIAGNOSIS — M62838 Other muscle spasm: Secondary | ICD-10-CM | POA: Diagnosis not present

## 2021-03-05 DIAGNOSIS — Z113 Encounter for screening for infections with a predominantly sexual mode of transmission: Secondary | ICD-10-CM | POA: Diagnosis present

## 2021-03-05 DIAGNOSIS — J4520 Mild intermittent asthma, uncomplicated: Secondary | ICD-10-CM | POA: Diagnosis not present

## 2021-03-05 DIAGNOSIS — Z23 Encounter for immunization: Secondary | ICD-10-CM | POA: Diagnosis not present

## 2021-03-05 NOTE — Progress Notes (Signed)
Established Patient Office Visit  Subjective:  Patient ID: Kimberly Armstrong, female    DOB: 30-Jan-1981  Age: 40 y.o. MRN: [id number]  CC:  Chief Complaint  Patient presents with   Annual Exam   Gynecologic Exam    HPI Kimberly Armstrong presents for annual health maintenance exam.  She is states she recently started a new job.  She is working at home.  Doing a lot of keyboarding.  Neck and shoulders become very tense by the end of the day.  Will take Flexeril at night on the weekends to help reduce muscle tightness.  Unable to take during the week as it causes her to be sleepy.  She will wear over-the-counter Salonpas pain patches.  She also uses a TENS unit to help pain.  She states this is relatively effective. She did have routine, fasting labs done in April 2022.  All of which were essentially normal. She states that she recently went to urgent care for evaluation of urinary tract infection.  Was told that she had trichomonas.  Was given prescription for antibiotics.  She states she has not taken as of yet.  She has been married for several years.  Has not had any other intimate partners with her husband.  Denies vaginal discharge or irritation.  She would like to have verification of this diagnosis before she takes antibiotics. She has no new concerns or complaints today.  She is a smoker. She denies chest pain, chest pressure, or shortness of breath. She denies headaches or visual disturbances. She denies abdominal pain, nausea, vomiting, or changes in bowel or bladder habits. She is scheduled to have screening mammogram April 01, 2019.  Past Medical History:  Diagnosis Date   Asthma     Past Surgical History:  Procedure Laterality Date   ABDOMINAL HYSTERECTOMY N/A    [facility] 11/30/2020   BACK SURGERY     carpel tunnel     CESAREAN SECTION     CESAREAN SECTION N/A    [facility]ia 11/30/2020   EYE SURGERY N/A    [facility]ia 11/30/2020   PARTIAL HYSTERECTOMY      SPINE SURGERY N/A    [facility]ia 11/30/2020   TUBAL LIGATION N/A    [facility]reesia 11/30/2020    Family History  Problem Relation Age of Onset   Breast cancer Mother    Heart attack Mother    Hypertension Mother    Diabetes Sister     Social History   Socioeconomic History   Marital status: Married    Spouse name: Amisadai Woodford   Number of children: 3   Years of education: Associates degree   Highest education level: Associate degree: occupational, Scientist, product/process development, or vocational program  Occupational History   Occupation: Clinical biochemist Rep    Employer: [employer]  Tobacco Use   Smoking status: Every Day    Packs/day: 1.00    Types: Cigarettes    Start date: 12/03/2000   Smokeless tobacco: Never  Substance and Sexual Activity   Alcohol use: Yes    Alcohol/week: 2.0 standard drinks    Types: 2 Glasses of wine per week   Drug use: Never   Sexual activity: Yes    Partners: Male    Birth control/protection: None  Other Topics Concern   Not on file  Social History Narrative   Not on file   Social Determinants of Health   Financial Resource Strain: Not on file  Food Insecurity: Not on file  Transportation Needs: Not  on file  Physical Activity: Not on file  Stress: Not on file  Social Connections: Not on file  Intimate Partner Violence: Not on file    Outpatient Medications Prior to Visit  Medication Sig Dispense Refill   albuterol (VENTOLIN HFA) 108 (90 Base) MCG/ACT inhaler Inhale 2 puffs into the lungs every 6 (six) hours as needed for wheezing or shortness of breath. 1 each 2   baclofen (LIORESAL) 10 MG tablet Take 1 tablet (10 mg total) by mouth 3 (three) times daily. 30 each 0   budesonide-formoterol (SYMBICORT) 160-4.5 MCG/ACT inhaler Inhale 2 puffs into the lungs 2 (two) times daily. 1 each 1   cetirizine (ZYRTEC) 10 MG tablet TAKE 1 TABLET (10 MG TOTAL) BY MOUTH DAILY. 30 tablet 0   fluticasone (FLONASE) 50 MCG/ACT nasal spray PLACE 2 SPRAYS INTO BOTH NOSTRILS DAILY.  16 g 0   montelukast (SINGULAIR) 10 MG tablet Take 1 tablet (10 mg total) by mouth at bedtime. 30 tablet 3   naproxen (NAPROSYN) 500 MG tablet Take 1 tablet (500 mg total) by mouth 2 (two) times daily with a meal. 60 tablet 0   promethazine-dextromethorphan (PROMETHAZINE-DM) 6.25-15 MG/5ML syrup Take 5 mLs by mouth 4 (four) times daily as needed for cough. 100 mL 0   No facility-administered medications prior to visit.    No Known Allergies  ROS Review of Systems  Constitutional:  Positive for fatigue. Negative for activity change, chills and fever.  HENT:  Negative for congestion, postnasal drip, rhinorrhea, sinus pain and sore throat.   Eyes: Negative.   Respiratory:  Positive for cough and wheezing.        The patient states that she does have history of intermittent asthma.   Cardiovascular:  Negative for chest pain and palpitations.  Gastrointestinal:  Negative for constipation, diarrhea, nausea and vomiting.  Endocrine: Negative for cold intolerance, heat intolerance, polydipsia and polyuria.  Genitourinary:  Negative for dysuria, flank pain, frequency, pelvic pain and urgency.       Recently seen at urgent care for evaluation of urinary tract infection.  States she was positive for trichomonas.  Would like to have verification of diagnosis prior to taking antibiotics for this and having discussion with her husband, as she has not had any other intimate partners and her husband in many years.  Musculoskeletal:  Positive for arthralgias, back pain, myalgias, neck pain and neck stiffness.  Skin:  Negative for rash.  Neurological:  Negative for dizziness, weakness and headaches.  Hematological:  Negative for adenopathy.  Psychiatric/Behavioral:  Negative for dysphoric mood and sleep disturbance. The patient is not nervous/anxious.      Objective:    Physical Exam Vitals and nursing note reviewed. Exam conducted with a chaperone present.  Constitutional:      Appearance: Normal  appearance. She is well-developed. She is obese.  HENT:     Head: Normocephalic and atraumatic.     Right Ear: Ear canal and external ear normal.     Left Ear: Ear canal and external ear normal.     Nose: Nose normal.     Mouth/Throat:     Mouth: Mucous membranes are moist.  Eyes:     Extraocular Movements: Extraocular movements intact.     Conjunctiva/sclera: Conjunctivae normal.     Pupils: Pupils are equal, round, and reactive to light.  Cardiovascular:     Rate and Rhythm: Normal rate and regular rhythm.     Pulses: Normal pulses.     Heart sounds:  Normal heart sounds.  Pulmonary:     Effort: Pulmonary effort is normal.     Breath sounds: Normal breath sounds.  Chest:  Breasts:    Right: Normal. No swelling, bleeding, inverted nipple, mass, nipple discharge, skin change, tenderness or axillary adenopathy.     Left: Normal. No swelling, bleeding, inverted nipple, mass, nipple discharge, skin change, tenderness or axillary adenopathy.  Abdominal:     General: Bowel sounds are normal.     Palpations: Abdomen is soft.     Tenderness: There is no abdominal tenderness.     Hernia: There is no hernia in the left inguinal area or right inguinal area.  Genitourinary:    Exam position: Supine.     Labia:        Right: No rash, tenderness or lesion.        Left: No rash, tenderness or lesion.      Vagina: Normal. No signs of injury. No vaginal discharge, erythema, tenderness or bleeding.     Cervix: No cervical motion tenderness, discharge, friability, lesion or erythema.     Uterus: Normal.      Adnexa: Right adnexa normal and left adnexa normal.     Comments: No tenderness, masses, or organomeglay present during bimanual exam .   Musculoskeletal:        General: Normal range of motion.     Cervical back: Normal range of motion and neck supple.  Lymphadenopathy:     Cervical: No cervical adenopathy.     Upper Body:     Right upper body: No axillary adenopathy.     Left upper  body: No axillary adenopathy.     Lower Body: No right inguinal adenopathy. No left inguinal adenopathy.  Skin:    General: Skin is warm and dry.     Capillary Refill: Capillary refill takes less than 2 seconds.  Neurological:     General: No focal deficit present.     Mental Status: She is alert and oriented to person, place, and time.  Psychiatric:        Mood and Affect: Mood normal.        Behavior: Behavior normal.        Thought Content: Thought content normal.        Judgment: Judgment normal.   Today's Vitals   03/05/21 0815  BP: 139/90  Pulse: 78  Temp: 97.9 F (36.6 C)  SpO2: 96%  Weight: 225 lb 4.8 oz (102.2 kg)  Height: 5' 8.9" (1.75 m)   Body mass index is 33.37 kg/m.   Wt Readings from Last 3 Encounters:  03/05/21 225 lb 4.8 oz (102.2 kg)  12/03/20 234 lb 1.6 oz (106.2 kg)  03/23/19 250 lb (113.4 kg)     Health Maintenance Due  Topic Date Due   COVID-19 Vaccine (1) Never done   Pneumococcal Vaccine 60-25 Years old (1 - PCV) Never done   HIV Screening  Never done   Hepatitis C Screening  Never done    There are no preventive care reminders to display for this patient.  Lab Results  Component Value Date   TSH 1.890 12/10/2020   Lab Results  Component Value Date   WBC 4.7 12/10/2020   HGB 16.6 (H) 12/10/2020   HCT 47.9 (H) 12/10/2020   MCV 91 12/10/2020   PLT 176 12/10/2020   Lab Results  Component Value Date   NA 138 12/10/2020   K 4.1 12/10/2020   CO2 20 12/10/2020  GLUCOSE 93 12/10/2020   BUN 8 12/10/2020   CREATININE 0.82 12/10/2020   BILITOT 0.5 12/10/2020   ALKPHOS 75 12/10/2020   AST 18 12/10/2020   ALT 24 12/10/2020   PROT 7.0 12/10/2020   ALBUMIN 4.3 12/10/2020   CALCIUM 9.3 12/10/2020   ANIONGAP 10 09/08/2019   EGFR 92 12/10/2020   Lab Results  Component Value Date   CHOL 130 12/10/2020   Lab Results  Component Value Date   HDL 33 (L) 12/10/2020   Lab Results  Component Value Date   LDLCALC 76 12/10/2020   Lab  Results  Component Value Date   TRIG 115 12/10/2020   Lab Results  Component Value Date   CHOLHDL 3.9 12/10/2020   Lab Results  Component Value Date   HGBA1C 5.6 12/10/2020      Assessment & Plan:  1. Encounter for well woman exam with routine gynecological exam Annual wellness visit and Pap smear today. - Cytology - PAP( [facility])  2. Neck muscle spasm Continue using over-the-counter pain patches and TENS units during the week.  May use Flexeril at night on weekends to reduce muscle pain and tightness.  3. Mild intermittent asthma without complication Use rescue inhaler as needed and as prescribed.  4. Body mass index (BMI) of 33.0-33.9 in adult Recommend she limit her diet to 1200 to 1500 cal/day.  She should gradually incorporate exercise into her daily routine.  5. Screening for STD (sexually transmitted disease) Simple collected to evaluate for gonorrhea, chlamydia, and trichomonas.  Will notify patient of results when they are available. - STI Profile, CT/NG/TV - Cytology - PAP( [place])  6. Need for Tdap vaccination Tetanus vaccine administered in the office today. - Tdap vaccine greater than or equal to 7yo IM   Problem List Items Addressed This Visit       Respiratory   Mild intermittent asthma without complication     Musculoskeletal and Integument   Neck muscle spasm     Other   Encounter for well woman exam with routine gynecological exam - Primary   Relevant Orders   Cytology - PAP( [place]) (Completed)   Body mass index (BMI) of 33.0-33.9 in adult   Screening for STD (sexually transmitted disease)   Relevant Orders   STI Profile, CT/NG/TV   Cytology - PAP( ) (Completed)   Need for Tdap vaccination   Relevant Orders   Tdap vaccine greater than or equal to 7yo IM (Completed)      Follow-up: Return in about 6 months (around 09/05/2021) for routine check up .    Ronnell Freshwater, NP

## 2021-03-09 LAB — CYTOLOGY - PAP
Adequacy: ABSENT
Comment: NEGATIVE
Diagnosis: NEGATIVE
High risk HPV: NEGATIVE

## 2021-03-10 ENCOUNTER — Encounter: Payer: Self-pay | Admitting: Nurse Practitioner

## 2021-03-10 NOTE — Progress Notes (Signed)
pap smear was negative as was HPV. We will repeat this in three years. Waiting on other results.

## 2021-03-15 DIAGNOSIS — Z01419 Encounter for gynecological examination (general) (routine) without abnormal findings: Secondary | ICD-10-CM | POA: Insufficient documentation

## 2021-03-15 DIAGNOSIS — Z113 Encounter for screening for infections with a predominantly sexual mode of transmission: Secondary | ICD-10-CM | POA: Insufficient documentation

## 2021-03-15 DIAGNOSIS — Z6833 Body mass index (BMI) 33.0-33.9, adult: Secondary | ICD-10-CM | POA: Insufficient documentation

## 2021-03-15 DIAGNOSIS — Z23 Encounter for immunization: Secondary | ICD-10-CM | POA: Insufficient documentation

## 2021-03-15 DIAGNOSIS — J4520 Mild intermittent asthma, uncomplicated: Secondary | ICD-10-CM | POA: Insufficient documentation

## 2021-03-15 DIAGNOSIS — M62838 Other muscle spasm: Secondary | ICD-10-CM | POA: Insufficient documentation

## 2021-03-15 NOTE — Patient Instructions (Signed)
Plan de alimentacin restringido en grasas y colesterol Fat and Cholesterol Restricted Eating Plan El exceso de grasas y colesterol en la dieta puede causar problemas de [place]. Elegir los alimentos adecuados ayuda a Theatre manager normales los niveles de grasasy colesterol. Esto puede evitarle contraer ciertas enfermedades. El mdico puede recomendarle un plan de alimentacin que incluya lo siguiente: Grasas totales: ______% o menos del total de caloras por da. Grasas saturadas: ______% o menos del total de caloras por da. Colesterol: menos de _________mg Darlin Coco. Fibra: ______g Darlin Coco. Consejos para seguir Science writer de las comidas En las comidas, [place] su plato en cuatro partes iguales: Llene la mitad del plato con verduras y ensaladas de hojas verdes. Llene un cuarto del plato con cereales integrales. Llene un cuarto del plato con alimentos con protenas con bajo contenido de grasas [employer]). Coma pescado con alto contenido de grasas omega3 al [employer] veces por semana. Esas grasas se encuentran en la caballa, el atn, las sardinas y el salmn. Coma alimentos con alto contenido de [place], como cereales [place], frijoles, [place], [place], [place], guisantes y [place]. Consejos generales  Si necesita adelgazar, consulte a su mdico. Evite lo siguiente: Alimentos con Holiday representative. Comidas fritas. Alimentos con aceites parcialmente hidrogenados. Limite el consumo de alcohol a no ms de 2medida por da si es mujer y no est [place], y a 60medidas por da si es hombre. Una medida equivale a 12oz de Chartered certified accountant, 5oz de vino o 1oz de bebidas alcohlicas de alta graduacin.  Lea las etiquetas de los alimentos Lea las etiquetas de los alimentos para Civil engineer, contracting lo siguiente: Si contienen grasas trans. Si contienen aceites parcialmente hidrogenados. La cantidad de grasas saturadas (g) que contiene cada porcin. La cantidad de colesterol (mg) que contiene cada  porcin. La cantidad de fibra (g) que contiene cada porcin. Elija alimentos con grasas saludables, tales como las siguientes: Grasas monoinsaturadas. Grasas poliinsaturadas. Grasas omega3. Elija productos de cereal que tengan cereales integrales. Busque la palabra "integral" en Equities trader de la lista de ingredientes. Al cocinar Emplee mtodos de coccin con poca cantidad de grasa. Por ejemplo, hornear, hervir, grillar y Holiday representative. Coma ms comidas caseras. Coma en restaurantes y bares con menos frecuencia. Evite cocinar usando grasas saturadas, como [place], crema, aceite de [place], aceite de palmiste y aceite de [place]. [place] recomendados  Frutas Frutas frescas, en conserva (en su jugo natural) o frutas congeladas. Verduras Verduras frescas o congeladas (crudas, al vapor, asadas o grilladas). Ensaladas de hojas verdes. Granos Cereales integrales, como panes, galletas, cereales y pastas de integrales o de trigo integral. Avena sin endulzar, trigo bulgur, cebada, quinua o arroz integral. Tortillas de harina de maz o trigo integral. Carnes y otros alimentos ricos en protenas Carne de res molida (al 85% o ms magra), carne de res de animales alimentados con pastos o carne de res sin la grasa. Pollo o pavo sin piel. Carne de pollo o de [place]. Cerdo sin la grasa. Todos los pescados y frutos de mar. Claras de huevo. Porotos, guisantes o lentejas secos. Frutos secos o semillas sin sal. Frijoles enlatados sin sal. Mantequillas de frutos secos sin azcar ni aceite agregados. Lcteos Productos lcteos descremados o semidescremados, como [employer] o al 1%, quesos reducidos en grasas o al 2%, queso cottage o ricota con bajo contenido de [place] o sin contenido de [place], o yogur natural descremado o semidescremado. Grasas y aceites Margarina untable que no contenga grasas trans. [place] y condimentos para ensaladas livianos o reducidos en  grasas. [place]. Aceites de oliva, canola,  ssamo o crtamo. Es posible que los productos que se enumeran ms [place] no constituyan una lista completa de los alimentos y las bebidas que puede tomar. Consulte a unnutricionista para obtener ms informacin. Alimentos a Ambulance person Fruta enlatada en almbar espeso. Frutas con salsa de crema o mantequilla. Frutas cocidas en aceite. Verduras Verduras cocinadas con salsas de queso, crema o mantequilla. Verduras fritas. Granos Pan blanco. Pastas blancas. Arroz blanco. Pan de maz. Bagels, pasteles y croissants. Galletas saladas y colaciones que contengan grasas trans y aceites hidrogenados. Carnes y otros alimentos ricos en protenas [place] de carne con alto contenido de [place]. Costillas, alas de pollo, tocineta, salchicha, mortadela, salame, chinchulines, tocino, perros calientes, salchichas alemanas y embutidos envasados. Hgado y otros rganos. Huevos enteros y yemas de [place]. Pollo y pavo con piel. Carne frita. Lcteos [place] entera o al 2%, crema, mezcla de [place] y crema, y queso crema. Quesos enteros. Yogur entero o endulzado. Quesos con toda su grasa. Cremas no lcteas y coberturas batidas. Quesos procesados, quesos para untar y [place]. Bebidas Alcohol. Bebidas endulzadas con azcar, como refrescos, limonada y bebidas frutales. Grasas y aceites [place], [place] en barra, [place] de [place], [place], [place] clarificada o grasa de tocino. Aceites de coco, de palmiste y de palma. Dulces y postres Jarabe de maz, azcares, miel y Control and instrumentation engineer. Caramelos. Mermeladas y [place]. Chrissie Noa. Cereales endulzados. Galletas, pasteles, bizcochuelos, donas, muffins y helado. Es posible que los productos que se enumeran ms [place] no constituyan una lista completa de los alimentos y las bebidas que Nurse, adult. Consulte a unnutricionista para obtener ms informacin. Resumen Elegir los alimentos adecuados ayuda a Theatre manager normales los niveles de grasas y [place]. Esto puede evitarle contraer  ciertas enfermedades. En las comidas, llene la mitad del plato con verduras y ensaladas de hojas verdes. Coma alimentos con alto contenido de [place], como cereales [place], frijoles, [place], zanahorias, guisantes y [place]. Limite los alimentos con azcar agregada y grasas saturadas, el alcohol y las comidas fritas. Esta informacin no tiene Marine scientist el consejo del mdico. Asegresede hacerle al mdico cualquier pregunta que tenga. Document Revised: 02/22/2020 Document Reviewed: 02/22/2020 Elsevier Patient Education  [place].

## 2021-03-31 ENCOUNTER — Other Ambulatory Visit: Payer: Self-pay

## 2021-03-31 ENCOUNTER — Ambulatory Visit
Admission: RE | Admit: 2021-03-31 | Discharge: 2021-03-31 | Disposition: A | Discharge status: Home / Self Care | Payer: Managed Care, Other (non HMO) | Source: Ambulatory Visit | Attending: Nurse Practitioner | Admitting: Nurse Practitioner

## 2021-03-31 ENCOUNTER — Ambulatory Visit: Payer: Medicaid Other

## 2021-03-31 DIAGNOSIS — Z1231 Encounter for screening mammogram for malignant neoplasm of breast: Secondary | ICD-10-CM

## 2021-03-31 IMAGING — MG DIGITAL SCREENING BILAT W/ CAD
5 series · 5 of 5 positions shown · non-contrast
Comparison: None.

CLINICAL DATA: Screening.

EXAM:
DIGITAL SCREENING BILATERAL MAMMOGRAM WITH CAD
TECHNIQUE: Bilateral screening digital craniocaudal and mediolateral oblique
mammograms were obtained. The images were evaluated with
computer-aided detection.

[L CC]
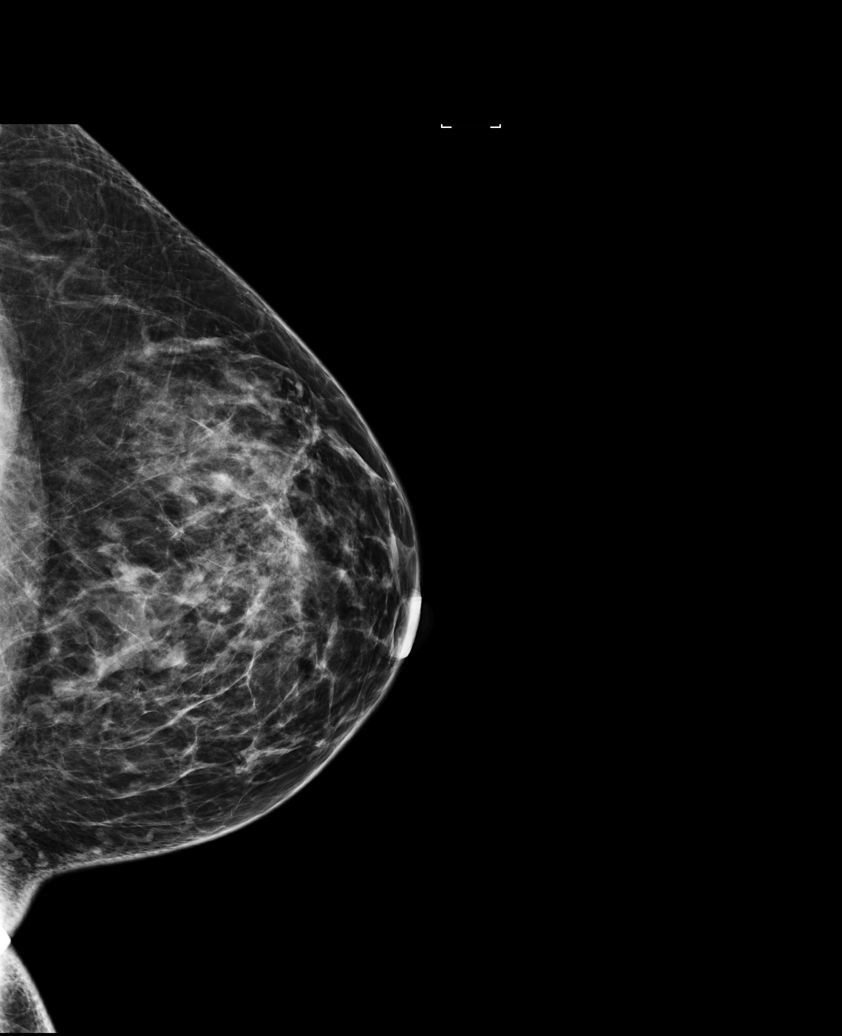

[L MLO (1 of 2)]
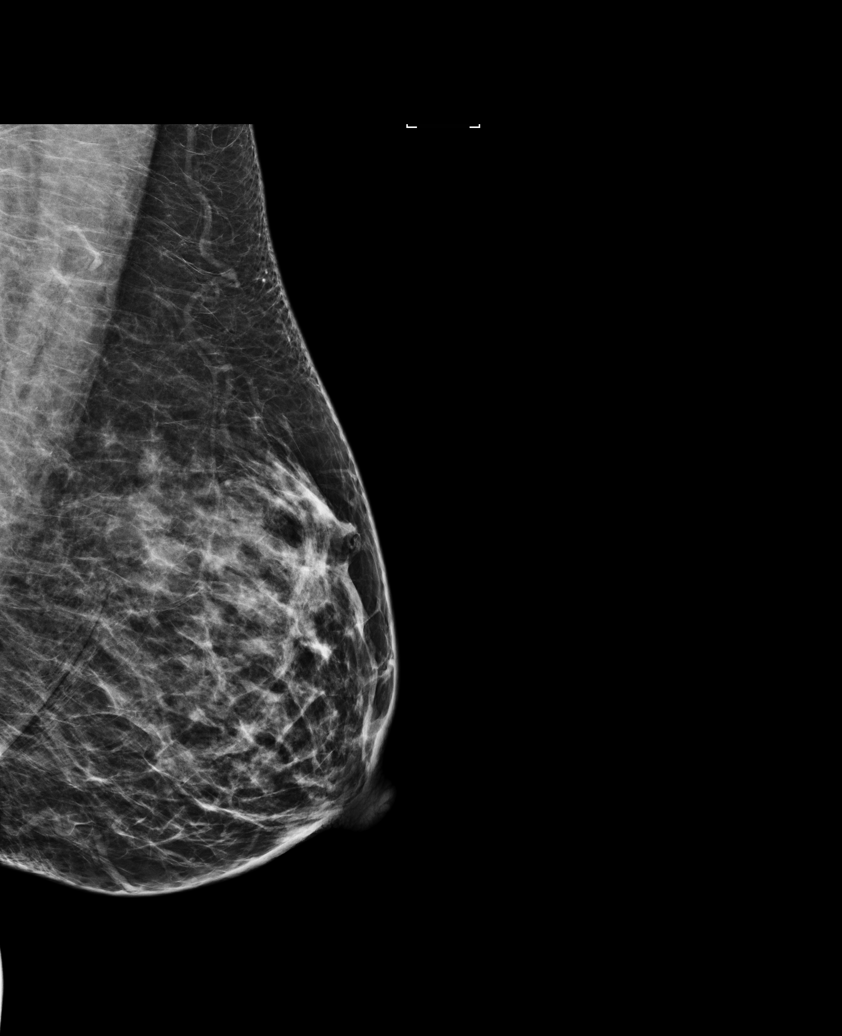

[L MLO (2 of 2)]
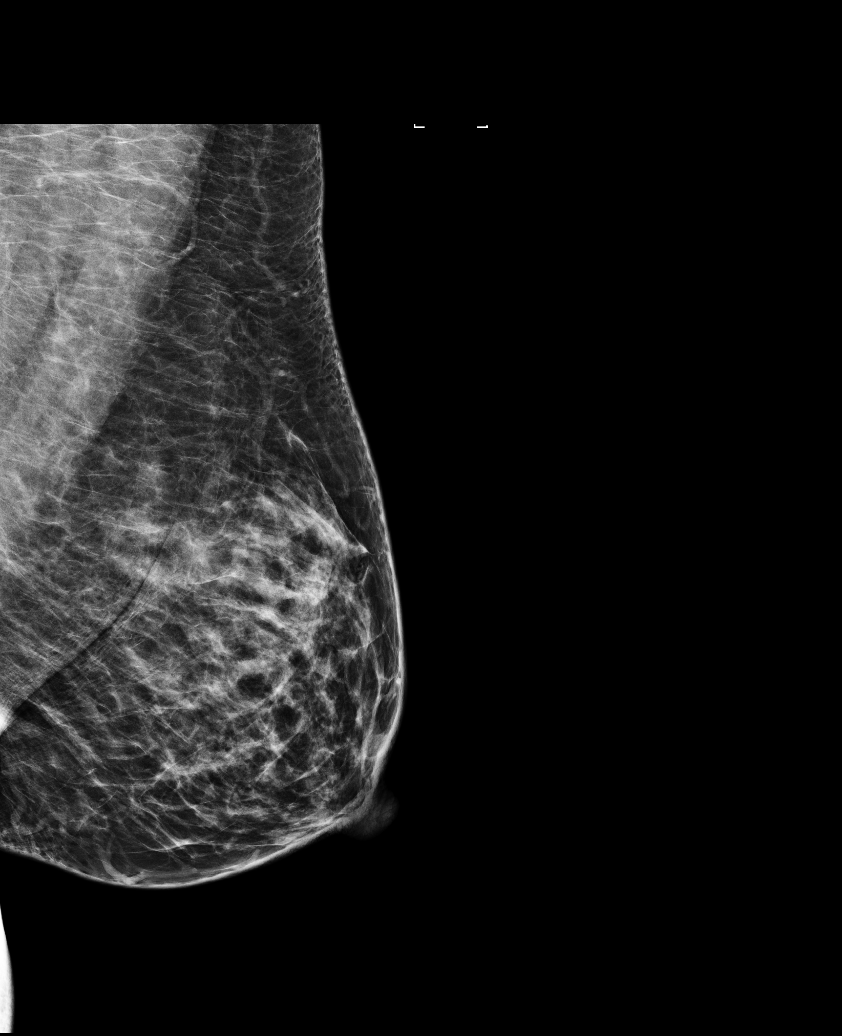

[R CC]
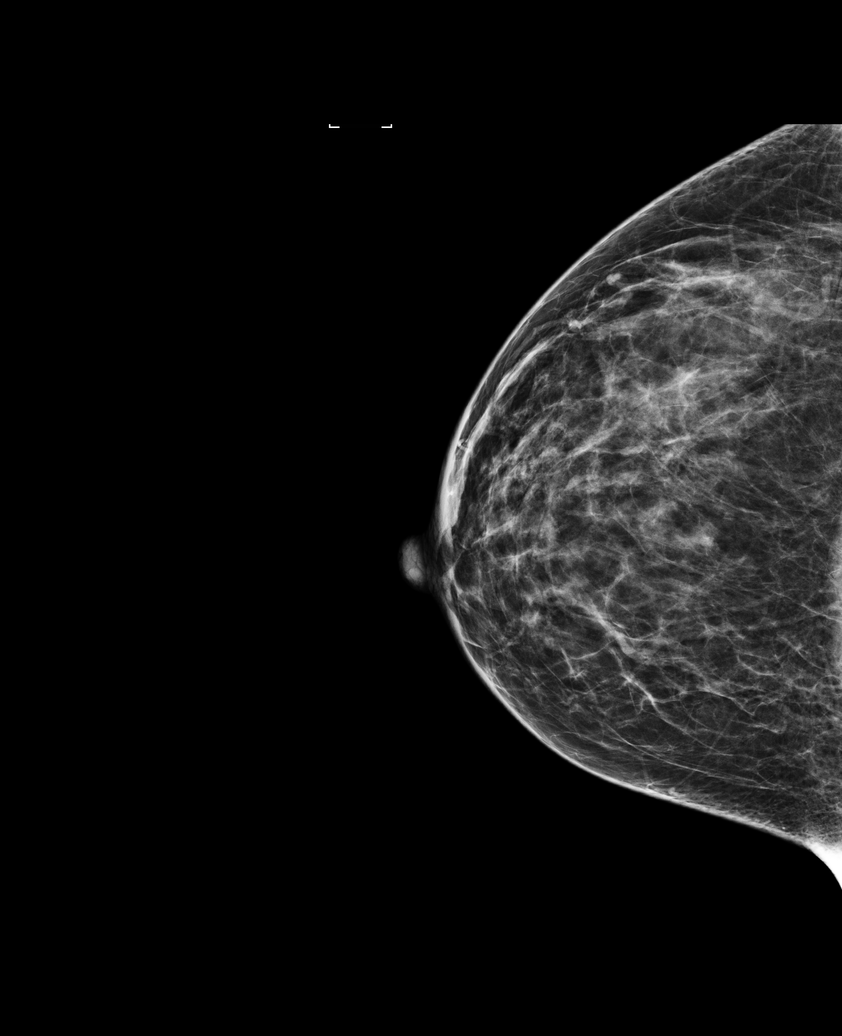

[R MLO]
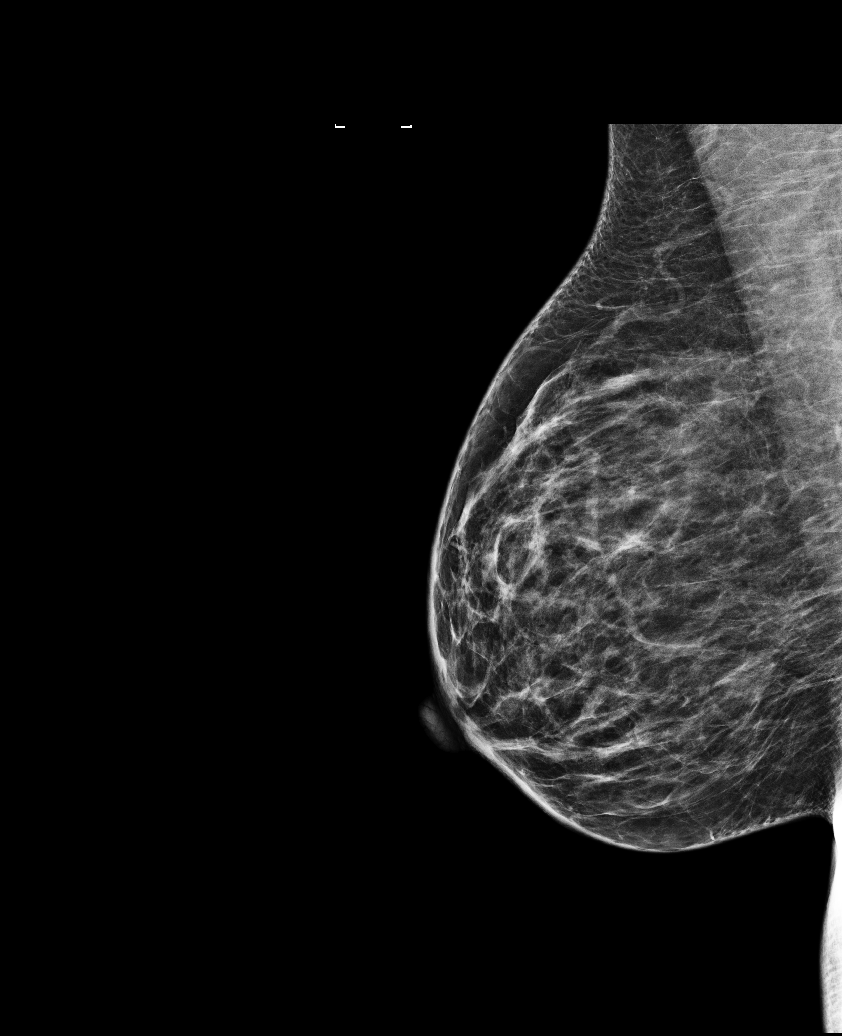

[5 of 5 positions shown; findings below may reference images not displayed]

ACR Breast Density Category c: The breast tissue is heterogeneously
dense, which may obscure small masses.
FINDINGS: In the left breast, a possible asymmetry warrants further
evaluation. In the right breast, no findings suspicious for
malignancy.
IMPRESSION: Further evaluation is suggested for possible asymmetry in the left
breast.

RECOMMENDATION:
Diagnostic mammogram and possibly ultrasound of the left breast.
(Code:[4L])

The patient will be contacted regarding the findings, and additional
imaging will be scheduled.

BI-RADS CATEGORY  0: Incomplete. Need additional imaging evaluation
and/or prior mammograms for comparison.

## 2021-04-03 ENCOUNTER — Encounter: Payer: Self-pay | Admitting: Nurse Practitioner

## 2021-04-03 ENCOUNTER — Other Ambulatory Visit: Payer: Self-pay | Admitting: Nurse Practitioner

## 2021-04-03 ENCOUNTER — Other Ambulatory Visit: Payer: Self-pay

## 2021-04-03 DIAGNOSIS — R928 Other abnormal and inconclusive findings on diagnostic imaging of breast: Secondary | ICD-10-CM

## 2021-04-04 NOTE — Progress Notes (Signed)
Additional imaging recommended. Will complete orders as requested

## 2021-04-06 NOTE — Telephone Encounter (Signed)
[facility] never resulted this order and they are saying they never got it. We can all see that it was ordered. We can have her come back in and do new urine sample for trichomoniasis only. Thanks. Can be [place] nurse visit.

## 2021-04-07 ENCOUNTER — Other Ambulatory Visit: Payer: Self-pay

## 2021-04-07 ENCOUNTER — Ambulatory Visit (INDEPENDENT_AMBULATORY_CARE_PROVIDER_SITE_OTHER): Payer: Managed Care, Other (non HMO) | Admitting: Nurse Practitioner

## 2021-04-07 VITALS — BP 135/88 | Ht 68.9 in | Wt 225.3 lb

## 2021-04-07 DIAGNOSIS — Z113 Encounter for screening for infections with a predominantly sexual mode of transmission: Secondary | ICD-10-CM

## 2021-04-08 ENCOUNTER — Encounter: Payer: Self-pay | Admitting: Physician Assistant

## 2021-04-08 ENCOUNTER — Telehealth: Payer: Managed Care, Other (non HMO) | Admitting: Physician Assistant

## 2021-04-08 DIAGNOSIS — J4541 Moderate persistent asthma with (acute) exacerbation: Secondary | ICD-10-CM | POA: Diagnosis not present

## 2021-04-08 DIAGNOSIS — B001 Herpesviral vesicular dermatitis: Secondary | ICD-10-CM | POA: Diagnosis not present

## 2021-04-08 MED ORDER — PREDNISONE 10 MG (21) PO TBPK: ORAL_TABLET | ORAL | 0 refills | Status: DC

## 2021-04-08 MED ORDER — VALTREX 500 MG PO TABS: ORAL_TABLET | ORAL | 0 refills | Status: DC

## 2021-04-08 MED ORDER — VALACYCLOVIR HCL 500 MG PO TABS: ORAL_TABLET | ORAL | 0 refills | Status: DC

## 2021-04-08 NOTE — Patient Instructions (Signed)
Kimberly Armstrong, thank you for joining Mar Daring, PA-C for today's virtual visit.  While this provider is not your primary care provider (PCP), if your PCP is located in our provider database this encounter information will be shared with them immediately following your visit.  Consent: (Patient) Kimberly Armstrong provided verbal consent for this virtual visit at the beginning of the encounter.  Current Medications:  Current Outpatient Medications:    predniSONE (STERAPRED UNI-PAK 21 TAB) 10 MG (21) TBPK tablet, 6 day taper; take as directed on package instructions, Disp: 21 tablet, Rfl: 0   VALTREX 500 MG tablet, Take 2 tablets at onset of cold sore, then 1 tablet BID until resolved; repeat as needed, Disp: 30 tablet, Rfl: 0   albuterol (VENTOLIN HFA) 108 (90 Base) MCG/ACT inhaler, Inhale 2 puffs into the lungs every 6 (six) hours as needed for wheezing or shortness of breath., Disp: 1 each, Rfl: 2   baclofen (LIORESAL) 10 MG tablet, Take 1 tablet (10 mg total) by mouth 3 (three) times daily., Disp: 30 each, Rfl: 0   budesonide-formoterol (SYMBICORT) 160-4.5 MCG/ACT inhaler, Inhale 2 puffs into the lungs 2 (two) times daily., Disp: 1 each, Rfl: 1   cetirizine (ZYRTEC) 10 MG tablet, TAKE 1 TABLET (10 MG TOTAL) BY MOUTH DAILY., Disp: 30 tablet, Rfl: 0   fluticasone (FLONASE) 50 MCG/ACT nasal spray, PLACE 2 SPRAYS INTO BOTH NOSTRILS DAILY., Disp: 16 g, Rfl: 0   montelukast (SINGULAIR) 10 MG tablet, Take 1 tablet (10 mg total) by mouth at bedtime., Disp: 30 tablet, Rfl: 3   naproxen (NAPROSYN) 500 MG tablet, Take 1 tablet (500 mg total) by mouth 2 (two) times daily with a meal., Disp: 60 tablet, Rfl: 0   Medications ordered in this encounter:  Meds ordered this encounter  Medications   predniSONE (STERAPRED UNI-PAK 21 TAB) 10 MG (21) TBPK tablet    Sig: 6 day taper; take as directed on package instructions    Dispense:  21 tablet    Refill:  0    Order Specific  Question:   Supervising Provider    Answer:   MILLER, BRIAN [id number]   VALTREX 500 MG tablet    Sig: Take 2 tablets at onset of cold sore, then 1 tablet BID until resolved; repeat as needed    Dispense:  30 tablet    Refill:  0    Order Specific Question:   Supervising Provider    Answer:   Noemi Chapel [[id number]]     *If you need refills on other medications prior to your next appointment, please contact your pharmacy*  Follow-Up: Call back or seek an in-person evaluation if the symptoms worsen or if the condition fails to improve as anticipated.  Other Instructions See below   If you have been instructed to have an in-person evaluation today at a local Urgent Care facility, please use the link below. It will take you to a list of all of our available [facility], including address, phone number and hours of operation. Please do not delay care.  [facility]  If you or a family member do not have a primary care provider, use the link below to schedule a visit and establish care. When you choose a [facility] primary care physician or advanced practice provider, you gain a long-term partner in health. Find a Primary Care Provider  Learn more about 's in-office and virtual care options: [facility]  Asthma Attack Prevention, Adult Although you may not be able to control the fact that you have asthma, you can take actions to prevent episodes of asthma (asthma attacks). How can this condition affect me? Asthma attacks (flare ups) can cause trouble breathing, wheezing, and coughing. They may keep you fromdoing activities you like to do. What can increase my risk? Coming into contact with things that cause asthma symptoms (asthma triggers) can put you at risk for an asthma attack. Common asthma triggers include: Things you are allergic to (allergens), such as: Dust mite and cockroach droppings. Pet dander. Mold. Pollen from trees and  grasses. Food allergies. This might be a specific food or added chemicals called sulfites. Irritants, such as: Weather changes including very cold, dry, or humid air. Smoke. This includes campfire smoke, air pollution, and tobacco smoke. Strong odors from aerosol sprays and fumes from perfume, candles, and household cleaners. Other triggers, such as: Certain medicines. This includes NSAIDs, such as ibuprofen and aspirin. Viral respiratory infections (colds), including runny nose (rhinitis) or infection in the sinuses (sinusitis). Activity including exercise, laughing, or crying. Not using inhaled medicines (corticosteroids) as told. What actions can I take to prevent an asthma attack? Stay healthy. Stay up to date on all immunizations as told by your health care provider, including the yearly flu (influenza) vaccine and pneumonia vaccine. Many asthma attacks can be prevented by carefully following your written asthma action plan. Follow your asthma action plan Work with your health care provider to create a written asthma action plan. This plan should include: A list of your asthma triggers and how to avoid or reduce them. A list of symptoms that you may have during an asthma attack. Information about which medicine to take, when to take the medicine, and how much of the medicine to take. Information to help you understand your peak flow measurements. Daily actions that you can take to prevent (control) your asthma symptoms. Contact information for your health care providers. If you have an asthma attack, act quickly. Follow the emergency steps on your written asthma action plan. This may prevent you from needing to go to the hospital. Monitor your asthma. To do this: Use your peak flow meter every morning and every evening for 2-3 weeks or as told by your health care provider. Record the results in a journal. A drop in your peak flow numbers on one or more days may mean that you are  starting to have an asthma attack, even if you are not having symptoms. When you have asthma symptoms, write them down in a journal. Write down in your journal how often you need to use your fast-acting rescue inhaler. If you are using your rescue inhaler more often, it may mean that your asthma is not under control. Talk with your health care provider about adjusting your asthma treatment plan to help you prevent future asthma attacks and gain better control of your condition.  Lifestyle Avoid or reduce contact with known outdoor allergens by staying indoors, keeping windows closed, and using air conditioning when pollen and mold counts are high. Do not use any products that contain nicotine or tobacco, such as cigarettes, e-cigarettes, and chewing tobacco. If you need help quitting, ask your health care provider. If you are overweight, consider a weight-loss plan. Find ways to cope with stress and your feelings, such as mindfulness, relaxation, or breathing exercises. Ask your health care provider if a breathing exercise program (pulmonary rehabilitation) may be helpful to control symptoms and  improve your quality of life. Medicines  Take over-the-counter and prescription medicines only as told by your health care provider. Do not stop taking your medicine and do not take less medicine even if you are doing well. Let your health care provider know: How often you use your rescue inhaler. How often you have symptoms when you are taking your regular medicines. If you wake up at night because of asthma symptoms. If you have more trouble with your breathing when you exercise.  Activity Do your normal activities as told by your health care provider. Ask your health care provider what activities are safe for you. Some people have asthma symptoms or more asthma symptoms when they exercise. This is called exercise-induced bronchoconstriction (EIB). If you have this problem, talk with your health care  provider about how to manage EIB. Some tips to follow include: Use your fast-acting inhaler before exercise. Exercise indoors if it is very cold, humid, or the pollen and mold counts are high. Warm up and cool down before and after exercise. Stop exercising right away if your asthma symptoms start or get worse. Where to find more information Asthma and Allergy Foundation of America: www.aafa.org Centers for Disease Control and Prevention: http://www.wolf.info/ American Lung Association: www.lung.org National Heart, Lung, and Blood Institute: https://wilson-eaton.com/ World Health Organization: RoleLink.com.br Get help right away if: You have followed your written asthma action plan and your symptoms are not improving. Summary Asthma attacks (flare ups) can cause trouble breathing, wheezing, and coughing. They may keep you from doing activities you normally like to do. Work with your health care provider to create a written asthma action plan. Do not stop taking your medicine and do not take less medicine even if you are doing well. Do not use any products that contain nicotine or tobacco, such as cigarettes, e-cigarettes, and chewing tobacco. If you need help quitting, ask your health care provider. This information is not intended to replace advice given to you by your health care provider. Make sure you discuss any questions you have with your healthcare provider. Document Revised: 08/14/2019 Document Reviewed: 08/14/2019 Elsevier Patient Education  2022 [employer].

## 2021-04-08 NOTE — Addendum Note (Signed)
Addended by: Mar Daring on: 04/08/2021 10:39 AM   Modules accepted: Orders

## 2021-04-08 NOTE — Progress Notes (Signed)
Virtual Visit Consent   Kimberly Armstrong, you are scheduled for a virtual visit with a Kimberly Armstrong provider today.     Just as with appointments in the office, your consent must be obtained to participate.  Your consent will be active for this visit and any virtual visit you may have with one of our providers in the next 365 days.     If you have a MyChart account, a copy of this consent can be sent to you electronically.  All virtual visits are billed to your insurance company just like a traditional visit in the office.    As this is a virtual visit, video technology does not allow for your provider to perform a traditional examination.  This may limit your provider's ability to fully assess your condition.  If your provider identifies any concerns that need to be evaluated in person or the need to arrange testing (such as labs, EKG, etc.), we will make arrangements to do so.     Although advances in technology are sophisticated, we cannot ensure that it will always work on either your end or our end.  If the connection with a video visit is poor, the visit may have to be switched to a telephone visit.  With either a video or telephone visit, we are not always able to ensure that we have a secure connection.     I need to obtain your verbal consent now.   Are you willing to proceed with your visit today?    Kimberly Armstrong has provided verbal consent on 04/08/2021 for a virtual visit (video or telephone).   Mar Daring, PA-C   Date: 04/08/2021 10:15 AM   Virtual Visit via Video Note   I, Mar Daring, connected with  Kimberly Armstrong  ([id number], May 22, 1981) on 04/08/21 at 10:15 AM EDT by a video-enabled telemedicine application and verified that I am speaking with the correct person using two identifiers.  Location: Patient: Virtual Visit Location Patient: Home Provider: Virtual Visit Location Provider: Home Office   I discussed the  limitations of evaluation and management by telemedicine and the availability of in person appointments. The patient expressed understanding and agreed to proceed.    History of Present Illness: Kimberly Armstrong is a 40 y.o. who identifies as a female who was assigned female at birth, and is being seen today for back pain and cold sore.  Patient does have h/o asthma and reports she is having increased chest tightness and a discomfort on the left lung posteriorly. She has not had an increase cough or wheezing. She denies any other URI symptoms. She has used her rescue inhaler and her nebulizer without relief in these symptoms.  She also notes having a cold sore on the upper lip. She does have a history of these but it has been a while. She reports when she lived in [place] she did have them more frequently, 3 a year, and had a standing order for Valtrex. She has not had as many recently so did not have any Valtrex left.    Problems:  Patient Active Problem List   Diagnosis Date Noted   Encounter for well woman exam with routine gynecological exam 03/15/2021   Neck muscle spasm 03/15/2021   Mild intermittent asthma without complication [id number]   Body mass index (BMI) of 33.0-33.9 in adult 03/15/2021   Screening for STD (sexually transmitted disease) 03/15/2021   Need for Tdap vaccination 03/15/2021   Encounter to  establish care 12/03/2020   Chronic midline low back pain with bilateral sciatica 12/03/2020   Moderate cigarette smoker 12/03/2020   Encounter for screening mammogram for breast cancer 12/03/2020   Asthma 12/13/1978    Allergies: No Known Allergies Medications:  Current Outpatient Medications:    predniSONE (STERAPRED UNI-PAK 21 TAB) 10 MG (21) TBPK tablet, 6 day taper; take as directed on package instructions, Disp: 21 tablet, Rfl: 0   VALTREX 500 MG tablet, Take 2 tablets at onset of cold sore, then 1 tablet BID until resolved; repeat as needed, Disp: 30 tablet, Rfl:  0   albuterol (VENTOLIN HFA) 108 (90 Base) MCG/ACT inhaler, Inhale 2 puffs into the lungs every 6 (six) hours as needed for wheezing or shortness of breath., Disp: 1 each, Rfl: 2   baclofen (LIORESAL) 10 MG tablet, Take 1 tablet (10 mg total) by mouth 3 (three) times daily., Disp: 30 each, Rfl: 0   budesonide-formoterol (SYMBICORT) 160-4.5 MCG/ACT inhaler, Inhale 2 puffs into the lungs 2 (two) times daily., Disp: 1 each, Rfl: 1   cetirizine (ZYRTEC) 10 MG tablet, TAKE 1 TABLET (10 MG TOTAL) BY MOUTH DAILY., Disp: 30 tablet, Rfl: 0   fluticasone (FLONASE) 50 MCG/ACT nasal spray, PLACE 2 SPRAYS INTO BOTH NOSTRILS DAILY., Disp: 16 g, Rfl: 0   montelukast (SINGULAIR) 10 MG tablet, Take 1 tablet (10 mg total) by mouth at bedtime., Disp: 30 tablet, Rfl: 3   naproxen (NAPROSYN) 500 MG tablet, Take 1 tablet (500 mg total) by mouth 2 (two) times daily with a meal., Disp: 60 tablet, Rfl: 0  Observations/Objective: Patient is well-developed, well-nourished in no acute distress.  Resting comfortably at home.  Head is normocephalic, atraumatic.  No labored breathing.  Speech is clear and coherent with logical content.  Patient is alert and oriented at baseline.  Vesicular lesion noted on the right upper lip near midline  Assessment and Plan: 1. Cold sore - VALTREX 500 MG tablet; Take 2 tablets at onset of cold sore, then 1 tablet BID until resolved; repeat as needed  Dispense: 30 tablet; Refill: 0  2. Moderate persistent asthma with exacerbation - predniSONE (STERAPRED UNI-PAK 21 TAB) 10 MG (21) TBPK tablet; 6 day taper; take as directed on package instructions  Dispense: 21 tablet; Refill: 0  - Prednisone for asthma exacerbation not responding to normal relief medications - Valtrex for cold sores. Can also use Abreva OTC - Continue rescue inhaler PRN - See in person evaluation if not improving or if symptoms worsen  Follow Up Instructions: I discussed the assessment and treatment plan with the  patient. The patient was provided an opportunity to ask questions and all were answered. The patient agreed with the plan and demonstrated an understanding of the instructions.  A copy of instructions were sent to the patient via MyChart.  The patient was advised to call back or seek an in-person evaluation if the symptoms worsen or if the condition fails to improve as anticipated.  Time:  I spent 14 minutes with the patient via telehealth technology discussing the above problems/concerns.    Mar Daring, PA-C

## 2021-04-09 LAB — TRICHOMONAS VAGINALIS, PROBE AMP: Trich vag by NAA: NEGATIVE

## 2021-04-09 NOTE — Progress Notes (Signed)
Please let the patient know that testing for trichomoniasis was also negative. Thank you.  -HB

## 2021-04-14 ENCOUNTER — Ambulatory Visit: Payer: Managed Care, Other (non HMO)

## 2021-04-14 ENCOUNTER — Other Ambulatory Visit: Payer: Self-pay

## 2021-04-14 ENCOUNTER — Ambulatory Visit
Admission: RE | Admit: 2021-04-14 | Discharge: 2021-04-14 | Disposition: A | Discharge status: Home / Self Care | Payer: Managed Care, Other (non HMO) | Source: Ambulatory Visit | Attending: Nurse Practitioner | Admitting: Nurse Practitioner

## 2021-04-14 DIAGNOSIS — R928 Other abnormal and inconclusive findings on diagnostic imaging of breast: Secondary | ICD-10-CM

## 2021-04-14 IMAGING — US US BREAST*L* LIMITED INC AXILLA
1 series · 4 of 4 positions shown · non-contrast
Comparison: Baseline screening mammogram [DATE]

CLINICAL DATA: Possible left breast asymmetry.

EXAM:
DIGITAL DIAGNOSTIC UNILATERAL LEFT MAMMOGRAM WITH TOMOSYNTHESIS AND
CAD; ULTRASOUND LEFT BREAST LIMITED
TECHNIQUE: Left digital diagnostic mammography and breast tomosynthesis was
performed. The images were evaluated with computer-aided detection.;
Targeted ultrasound examination of the left breast was performed.

[Series 1: us breast*left* limited inc axilla · 0.06mm/px · 4 of 4 slices shown]
[im 1/4]
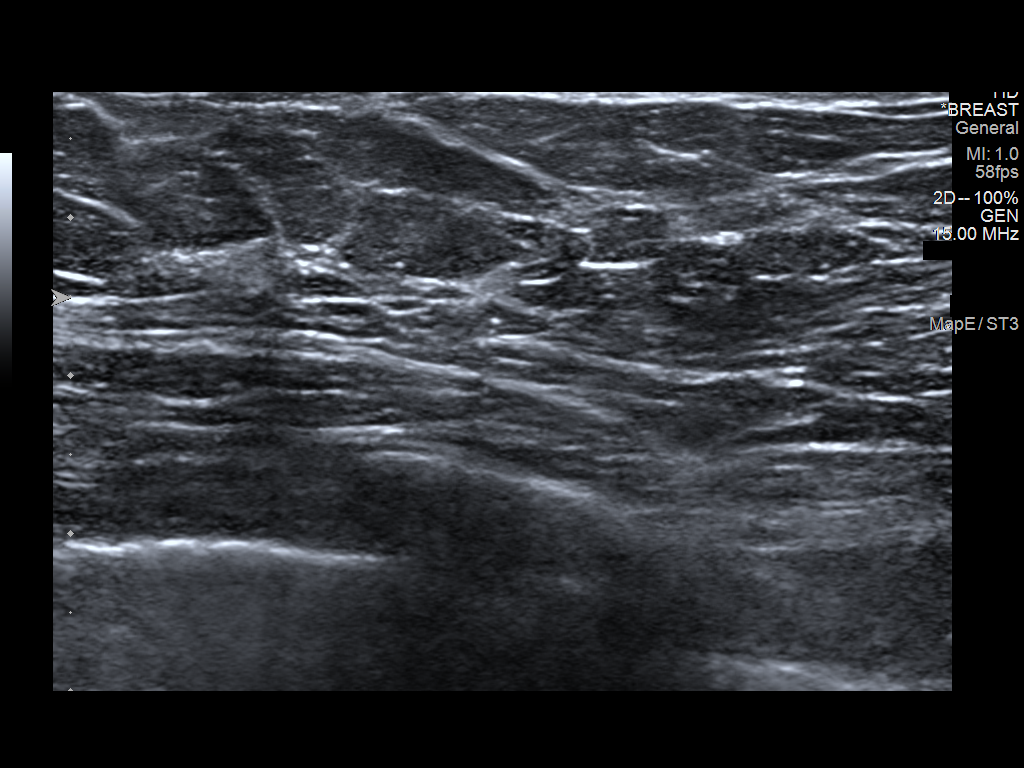
[im 2/4]
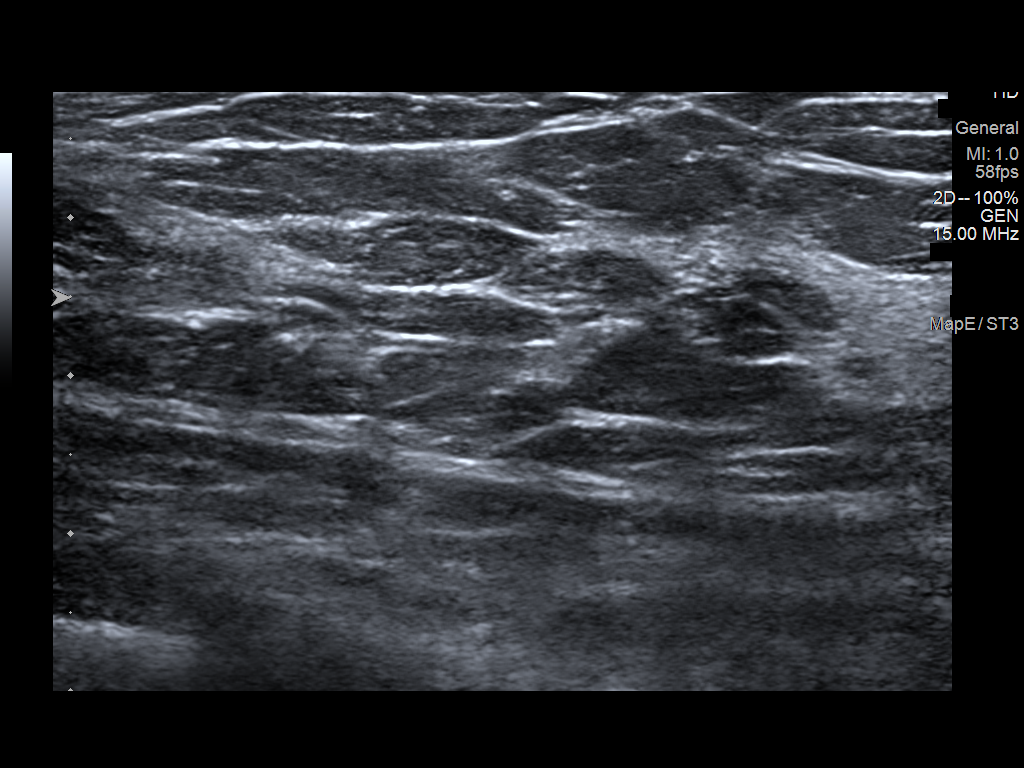
[im 3/4]
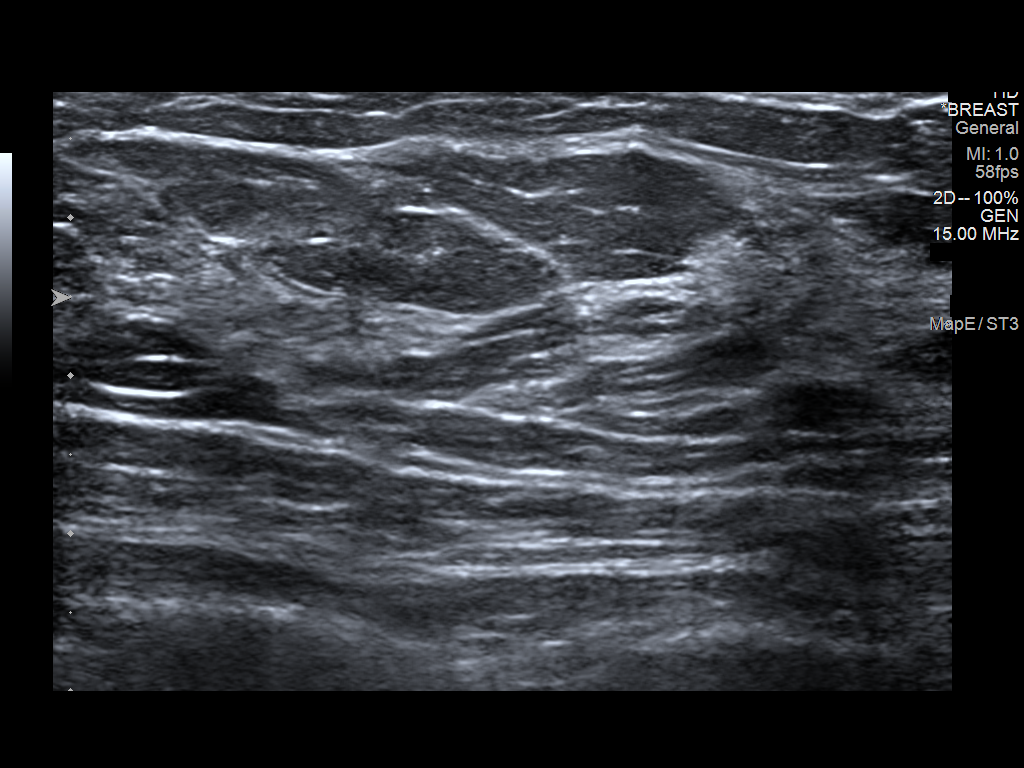
[im 4/4]
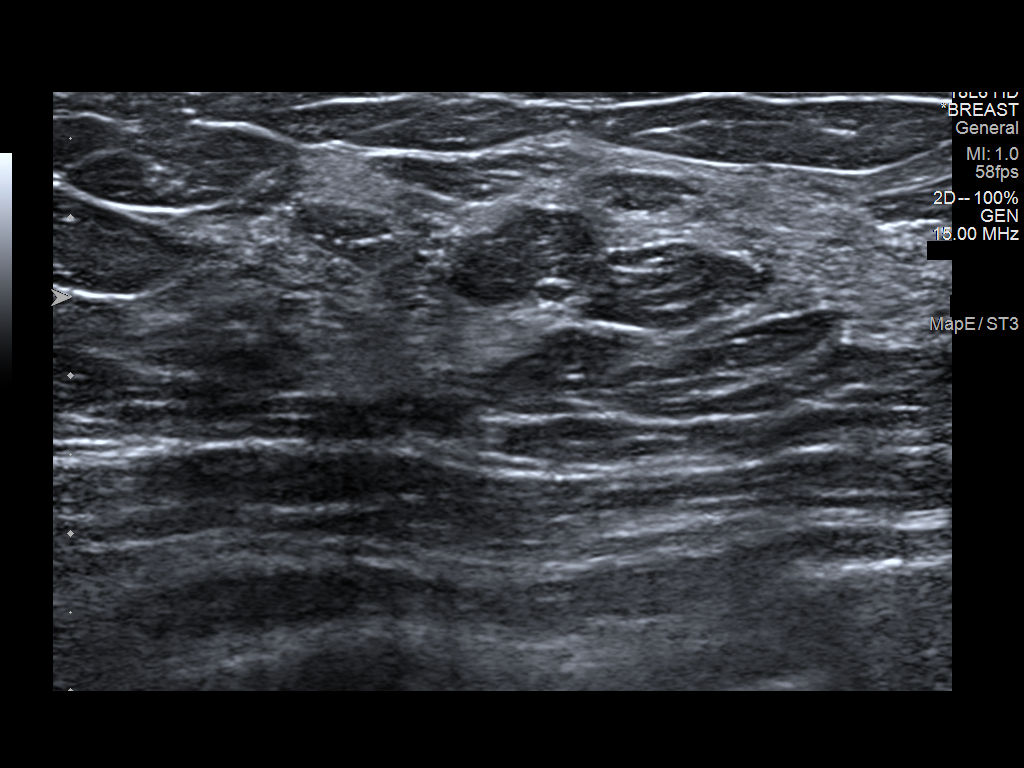

[4 of 4 positions shown; findings below may reference images not displayed]

ACR Breast Density Category c: The breast tissue is heterogeneously
dense, which may obscure small masses.
FINDINGS: Previously described possible asymmetry in the left breast
dissipates on additional views, consistent with overlapping
fibroglandular tissue.

At the time of clinical examination, the patient endorses
intermittent focal pain in the left upper inner breast, ongoing for
2 years. Given patient's reported focal symptoms, targeted
ultrasound of the left upper inner breast was performed. No
suspicious solid or cystic mass.
IMPRESSION: No mammographic or sonographic findings of malignancy.

RECOMMENDATION:
Annual screening mammogram.

I have discussed the findings and recommendations with the patient.
If applicable, a reminder letter will be sent to the patient
regarding the next appointment.

BI-RADS CATEGORY  1: Negative.

## 2021-04-14 IMAGING — MG MM DIGITAL DIAGNOSTIC UNILAT*L* W/ TOMO W/ CAD
6 of 12 series · 6 of 36 positions shown · non-contrast
Comparison: Baseline screening mammogram [DATE]

CLINICAL DATA: Possible left breast asymmetry.

EXAM:
DIGITAL DIAGNOSTIC UNILATERAL LEFT MAMMOGRAM WITH TOMOSYNTHESIS AND
CAD; ULTRASOUND LEFT BREAST LIMITED
TECHNIQUE: Left digital diagnostic mammography and breast tomosynthesis was
performed. The images were evaluated with computer-aided detection.;
Targeted ultrasound examination of the left breast was performed.

[L MLO synth-2D (1 of 2)]
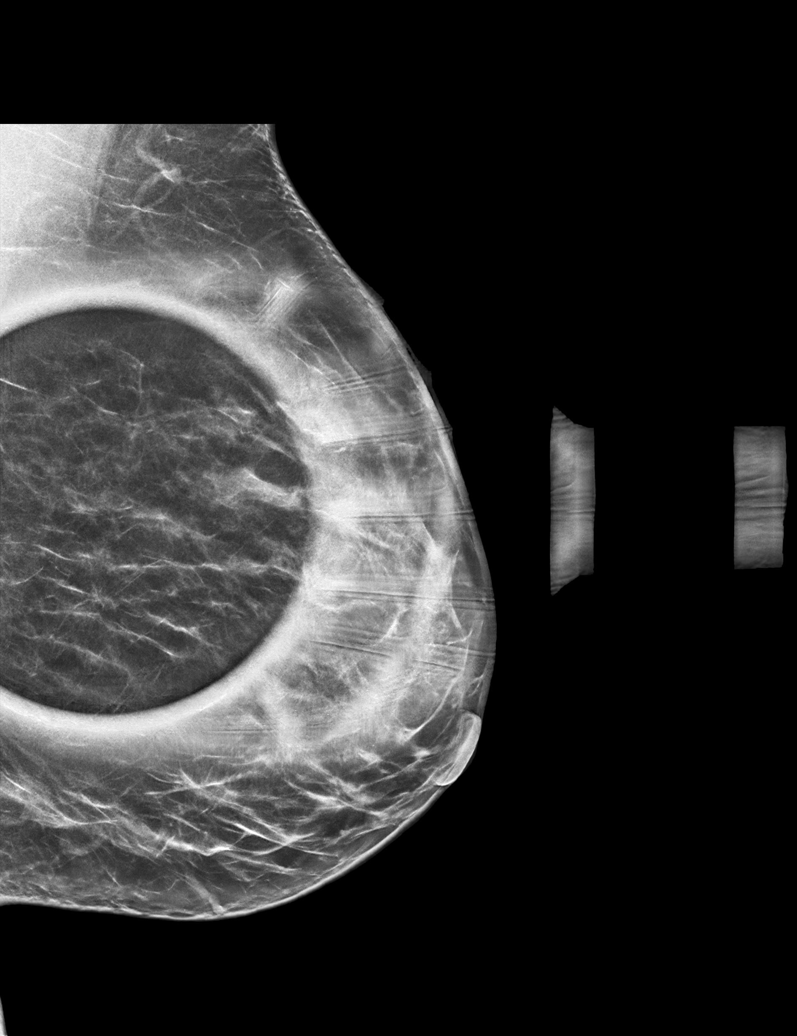

[L MLO synth-2D (2 of 2)]
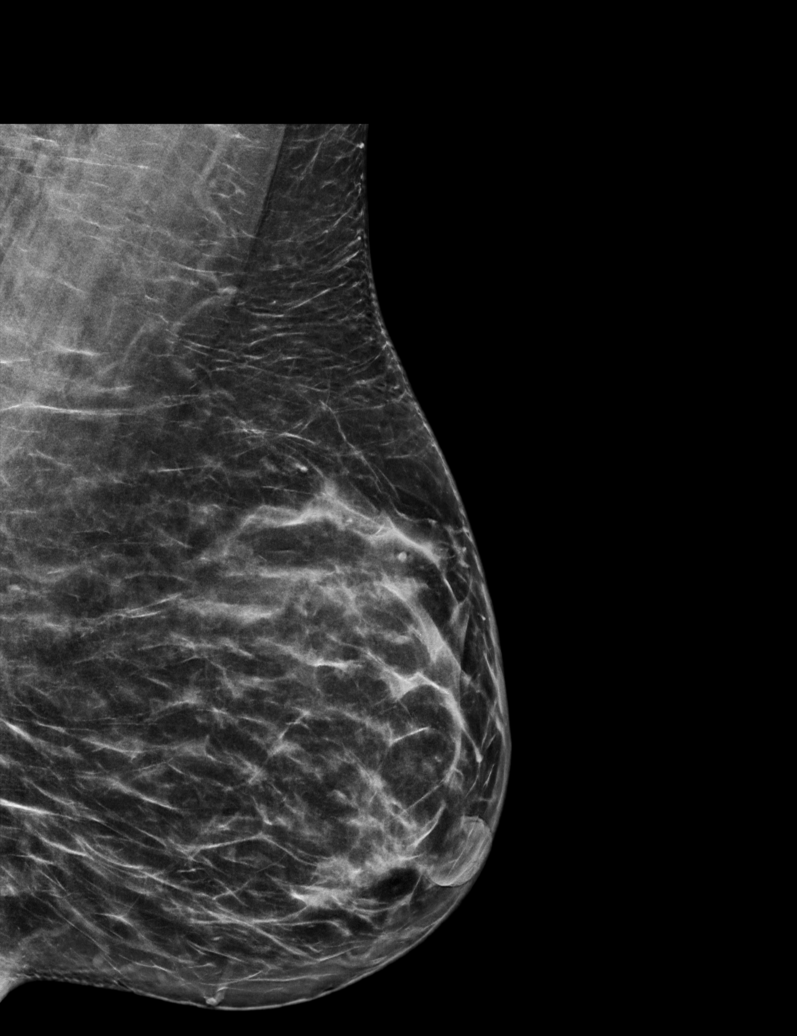

[L CC synth-2D (1 of 4)]
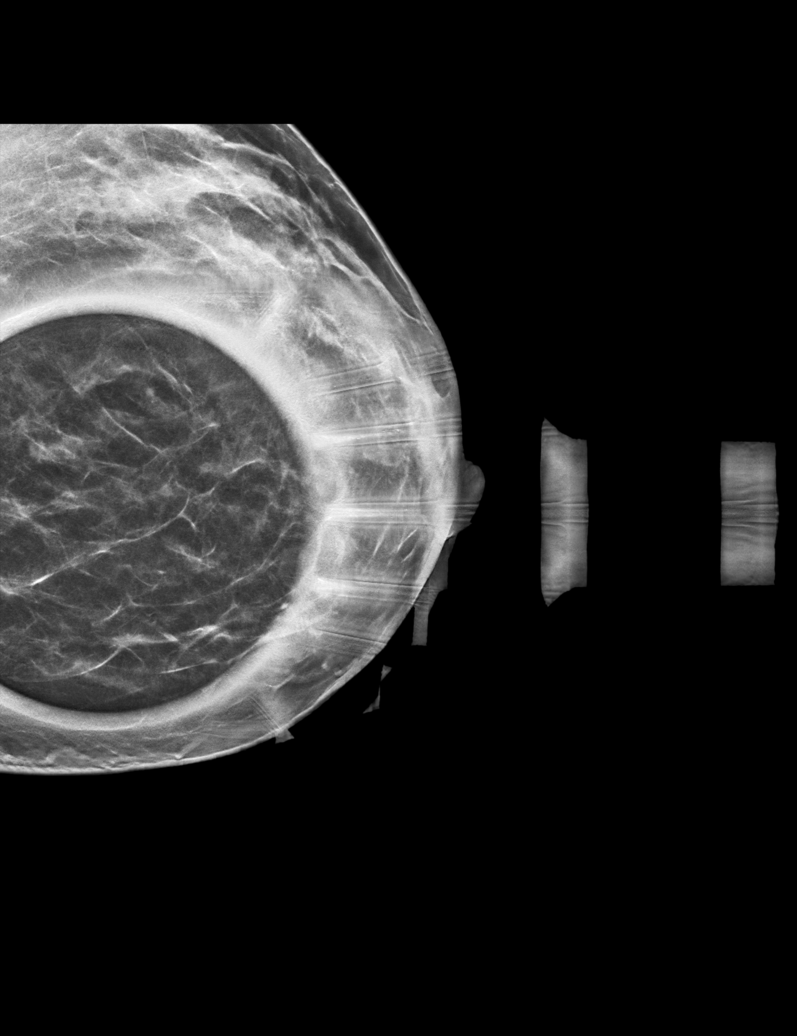

[L CC synth-2D (2 of 4)]
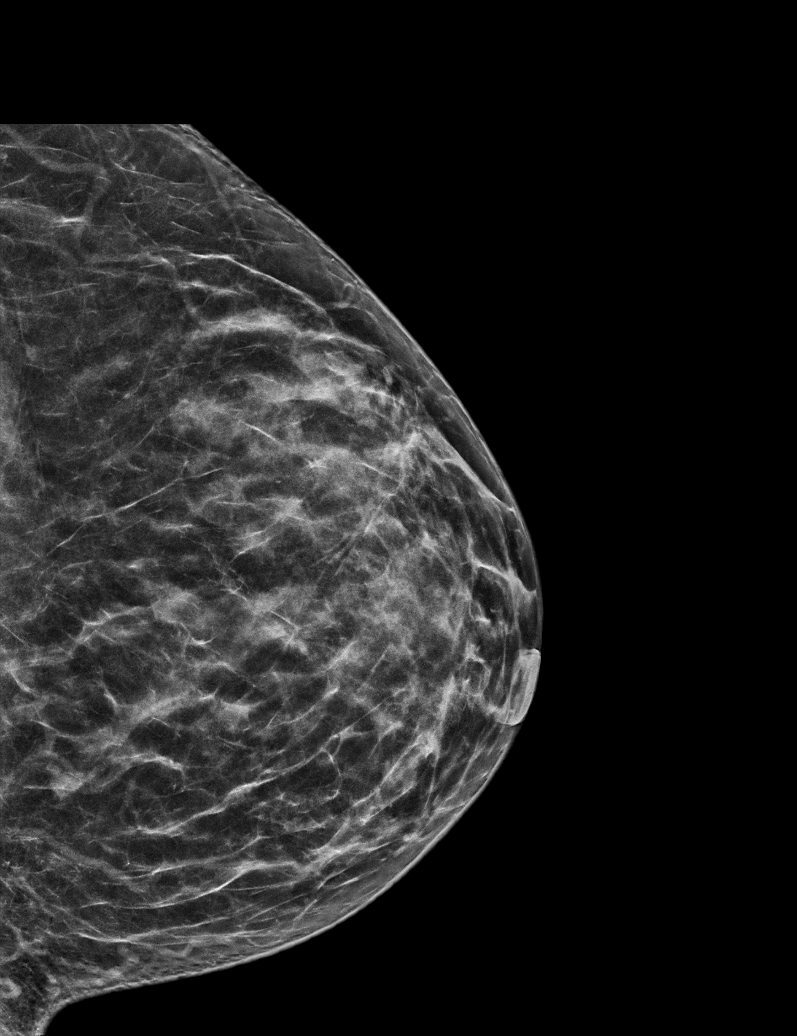

[L CC synth-2D (3 of 4)]
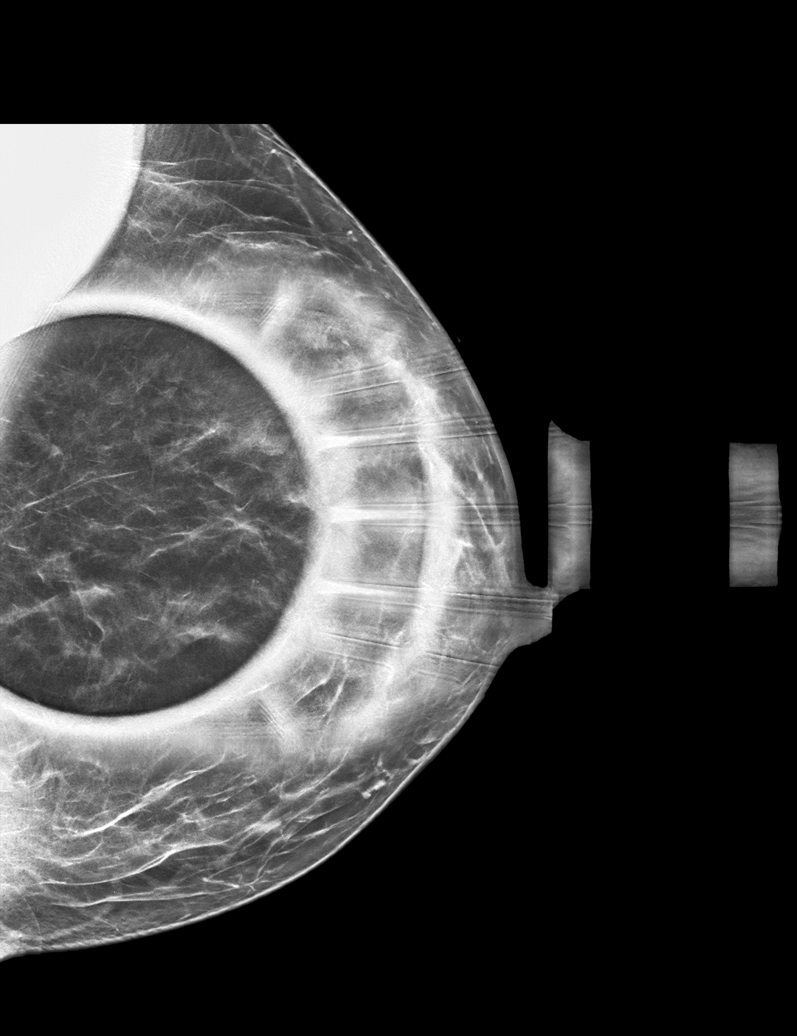

[L CC synth-2D (4 of 4)]
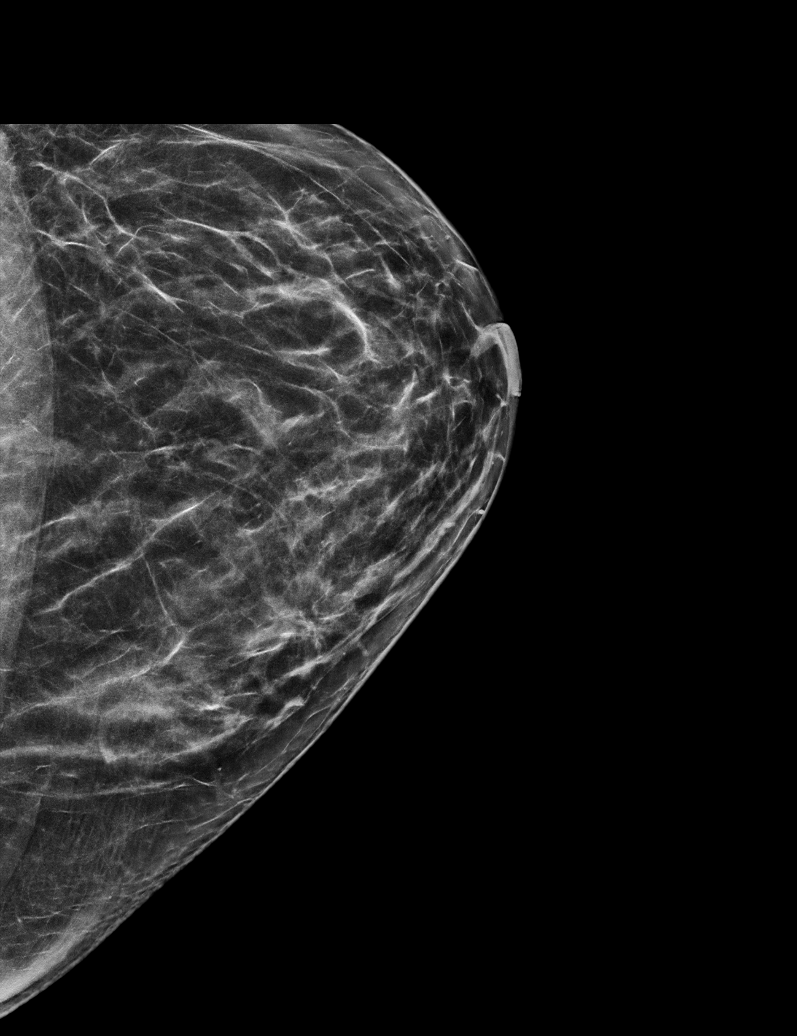

[6 of 36 positions shown; findings below may reference images not displayed]

ACR Breast Density Category c: The breast tissue is heterogeneously
dense, which may obscure small masses.
FINDINGS: Previously described possible asymmetry in the left breast
dissipates on additional views, consistent with overlapping
fibroglandular tissue.

At the time of clinical examination, the patient endorses
intermittent focal pain in the left upper inner breast, ongoing for
2 years. Given patient's reported focal symptoms, targeted
ultrasound of the left upper inner breast was performed. No
suspicious solid or cystic mass.
IMPRESSION: No mammographic or sonographic findings of malignancy.

RECOMMENDATION:
Annual screening mammogram.

I have discussed the findings and recommendations with the patient.
If applicable, a reminder letter will be sent to the patient
regarding the next appointment.

BI-RADS CATEGORY  1: Negative.

## 2021-04-14 NOTE — Progress Notes (Signed)
Negative diagnostic mammogram

## 2021-04-14 NOTE — Progress Notes (Signed)
Negative mammogram

## 2021-05-11 ENCOUNTER — Other Ambulatory Visit: Payer: Self-pay

## 2021-05-11 ENCOUNTER — Other Ambulatory Visit: Payer: Self-pay | Admitting: Family

## 2021-05-11 ENCOUNTER — Other Ambulatory Visit: Payer: Self-pay | Admitting: Nurse Practitioner

## 2021-05-11 ENCOUNTER — Telehealth: Payer: Self-pay | Admitting: Nurse Practitioner

## 2021-05-11 DIAGNOSIS — J302 Other seasonal allergic rhinitis: Secondary | ICD-10-CM

## 2021-05-11 DIAGNOSIS — M545 Low back pain, unspecified: Secondary | ICD-10-CM

## 2021-05-11 MED ORDER — FLUTICASONE PROPIONATE 50 MCG/ACT NA SUSP: 2.0000 | Freq: Every day | NASAL | 0 refills | Status: DC

## 2021-05-11 MED ORDER — CETIRIZINE HCL 10 MG PO TABS: 10.0000 mg | ORAL_TABLET | Freq: Every day | ORAL | 0 refills | Status: DC

## 2021-05-11 MED ORDER — DICLOFENAC SODIUM 75 MG PO TBEC: 75.0000 mg | DELAYED_RELEASE_TABLET | Freq: Two times a day (BID) | ORAL | 0 refills | Status: DC

## 2021-05-11 NOTE — Progress Notes (Signed)
Renewed diclofenac '75mg'$  bid prn pain/arthritis. She will have to stop naproxen or ibuprofen while taking diclofenac.

## 2021-05-11 NOTE — Telephone Encounter (Signed)
Renewed diclofenac '75mg'$  bid prn pain/arthritis. She will have to stop naproxen or ibuprofen while taking diclofenac. I discontiued the naproxen from her chart. Thanks

## 2021-05-11 NOTE — Telephone Encounter (Signed)
Patient is aware. AS, CMA

## 2021-05-11 NOTE — Telephone Encounter (Signed)
Patient calling office requesting refill of Diclofenac. This medication was originally prescribed by a different prescriber. Please advise.

## 2021-06-10 ENCOUNTER — Other Ambulatory Visit: Payer: Self-pay | Admitting: Nurse Practitioner

## 2021-06-10 DIAGNOSIS — J302 Other seasonal allergic rhinitis: Secondary | ICD-10-CM

## 2021-08-09 ENCOUNTER — Other Ambulatory Visit: Payer: Self-pay | Admitting: Nurse Practitioner

## 2021-08-09 DIAGNOSIS — J302 Other seasonal allergic rhinitis: Secondary | ICD-10-CM

## 2021-09-07 ENCOUNTER — Ambulatory Visit (INDEPENDENT_AMBULATORY_CARE_PROVIDER_SITE_OTHER): Payer: Managed Care, Other (non HMO) | Admitting: Nurse Practitioner

## 2021-09-07 ENCOUNTER — Other Ambulatory Visit: Payer: Self-pay

## 2021-09-07 ENCOUNTER — Encounter: Payer: Self-pay | Admitting: Nurse Practitioner

## 2021-09-07 VITALS — BP 134/83 | HR 78 | Temp 98.3°F | Ht 68.9 in | Wt 227.9 lb

## 2021-09-07 DIAGNOSIS — Z6833 Body mass index (BMI) 33.0-33.9, adult: Secondary | ICD-10-CM

## 2021-09-07 DIAGNOSIS — L723 Sebaceous cyst: Secondary | ICD-10-CM | POA: Diagnosis not present

## 2021-09-07 DIAGNOSIS — R10817 Generalized abdominal tenderness: Secondary | ICD-10-CM | POA: Insufficient documentation

## 2021-09-07 DIAGNOSIS — M545 Low back pain, unspecified: Secondary | ICD-10-CM | POA: Insufficient documentation

## 2021-09-07 DIAGNOSIS — J4520 Mild intermittent asthma, uncomplicated: Secondary | ICD-10-CM | POA: Diagnosis not present

## 2021-09-07 DIAGNOSIS — J302 Other seasonal allergic rhinitis: Secondary | ICD-10-CM | POA: Insufficient documentation

## 2021-09-07 DIAGNOSIS — Z23 Encounter for immunization: Secondary | ICD-10-CM

## 2021-09-07 DIAGNOSIS — B001 Herpesviral vesicular dermatitis: Secondary | ICD-10-CM | POA: Insufficient documentation

## 2021-09-07 MED ORDER — FLUTICASONE PROPIONATE 50 MCG/ACT NA SUSP: NASAL | 3 refills | Status: DC

## 2021-09-07 MED ORDER — VALACYCLOVIR HCL 500 MG PO TABS: ORAL_TABLET | ORAL | 1 refills | Status: DC

## 2021-09-07 MED ORDER — BACLOFEN 10 MG PO TABS: 10.0000 mg | ORAL_TABLET | Freq: Three times a day (TID) | ORAL | 1 refills | Status: DC

## 2021-09-07 MED ORDER — MONTELUKAST SODIUM 10 MG PO TABS: 10.0000 mg | ORAL_TABLET | Freq: Every day | ORAL | 3 refills | Status: DC

## 2021-09-07 MED ORDER — CETIRIZINE HCL 10 MG PO TABS: 10.0000 mg | ORAL_TABLET | Freq: Every day | ORAL | 1 refills | Status: DC

## 2021-09-07 MED ORDER — DICLOFENAC SODIUM 75 MG PO TBEC: 75.0000 mg | DELAYED_RELEASE_TABLET | Freq: Two times a day (BID) | ORAL | 1 refills | Status: DC

## 2021-09-07 MED ORDER — ALBUTEROL SULFATE HFA 108 (90 BASE) MCG/ACT IN AERS: 2.0000 | INHALATION_SPRAY | Freq: Four times a day (QID) | RESPIRATORY_TRACT | 2 refills | Status: DC | PRN

## 2021-09-07 MED ORDER — BUDESONIDE-FORMOTEROL FUMARATE 160-4.5 MCG/ACT IN AERO: 2.0000 | INHALATION_SPRAY | Freq: Two times a day (BID) | RESPIRATORY_TRACT | 1 refills | Status: DC

## 2021-09-07 NOTE — Progress Notes (Signed)
Established Patient Office Visit  Subjective:  Patient ID: Kimberly Armstrong, female    DOB: 13-May-1981  Age: 41 y.o. MRN: [id number]  CC:  Chief Complaint  Patient presents with   Follow-up    HPI Kimberly Armstrong presents for follow up visit. She states that she has a bump or lump on her back which has been present for a long time. She states that it started out as a small pimple, but developed into a larger and very sore lesion. She states that she went to [facility] ER about 2 years ago. It was drained. She was referred to general surgeon. By the time she saw the surgeon, the cyst had nearly resolved. Now, she states that she has noted that it is getting larger and more tender again. She states that she noted this around thanksgiving. She states that skin is intact. Lesion is very itchy. She states that it is not sore and is not draining any fluid.  Patient states that she has trouble with abdominal pain. States that when it is time to have a bowel movement, she gets very sore and tender in the left lower quadrant of the abdomen. She states that when she is full and has not yet had a bowel movement, she gets pain in the right lower quadrant of the abdomen. She denies burning or pain when urinating. She stats that she has not pain when it is time to have a bowel movement. She states this has been going on for several years. She states that this is happening about once every two weeks. She does not feel that this is associated with constipation. She denies blood in the stool and generally has regular bowel movements. She has not been able to determine specific foods which might make this worse. She states that laying down and applying pressure to the specific area of the abdomen. She states that exertion causes the pain to be excruciating.   Past Medical History:  Diagnosis Date   Asthma     Past Surgical History:  Procedure Laterality Date   ABDOMINAL HYSTERECTOMY N/A     [facility] 11/30/2020   BACK SURGERY     carpel tunnel     CESAREAN SECTION     CESAREAN SECTION N/A    [facility] 11/30/2020   EYE SURGERY N/A    [facility] 11/30/2020   PARTIAL HYSTERECTOMY     SPINE SURGERY N/A    [facility] 11/30/2020   TUBAL LIGATION N/A    [facility] 11/30/2020    Family History  Problem Relation Age of Onset   Breast cancer Mother    Heart attack Mother    Hypertension Mother    Diabetes Sister     Social History   Socioeconomic History   Marital status: Married    Spouse name: Bryahna Lesko   Number of children: 3   Years of education: Associates degree   Highest education level: Associate degree: occupational, Hotel manager, or vocational program  Occupational History   Occupation: Therapist, art Rep    Employer: [employer]  Tobacco Use   Smoking status: Every Day    Packs/day: 1.00    Types: Cigarettes    Start date: 12/03/2000   Smokeless tobacco: Never  Substance and Sexual Activity   Alcohol use: Yes    Alcohol/week: 2.0 standard drinks    Types: 2 Glasses of wine per week   Drug use: Never   Sexual activity: Yes    Partners: Male  Birth control/protection: None  Other Topics Concern   Not on file  Social History Narrative   Not on file   Social Determinants of Health   Financial Resource Strain: Not on file  Food Insecurity: Not on file  Transportation Needs: Not on file  Physical Activity: Not on file  Stress: Not on file  Social Connections: Not on file  Intimate Partner Violence: Not on file    Outpatient Medications Prior to Visit  Medication Sig Dispense Refill   albuterol (VENTOLIN HFA) 108 (90 Base) MCG/ACT inhaler Inhale 2 puffs into the lungs every 6 (six) hours as needed for wheezing or shortness of breath. 1 each 2   baclofen (LIORESAL) 10 MG tablet Take 1 tablet (10 mg total) by mouth 3 (three) times daily. 30 each 0   budesonide-formoterol (SYMBICORT) 160-4.5 MCG/ACT inhaler Inhale 2 puffs into the lungs 2 (two) times  daily. 1 each 1   cetirizine (ZYRTEC) 10 MG tablet TAKE 1 TABLET BY MOUTH EVERY DAY 90 tablet 0   diclofenac (VOLTAREN) 75 MG EC tablet Take 1 tablet (75 mg total) by mouth 2 (two) times daily. 30 tablet 0   fluticasone (FLONASE) 50 MCG/ACT nasal spray SPRAY 2 SPRAYS INTO EACH NOSTRIL EVERY DAY 16 mL 0   montelukast (SINGULAIR) 10 MG tablet Take 1 tablet (10 mg total) by mouth at bedtime. 30 tablet 3   valACYclovir (VALTREX) 500 MG tablet Take 2 tablets at onset of cold sore, then 1 tablet BID until resolved; repeat as needed 30 tablet 0   predniSONE (STERAPRED UNI-PAK 21 TAB) 10 MG (21) TBPK tablet 6 day taper; take as directed on package instructions 21 tablet 0   No facility-administered medications prior to visit.    No Known Allergies  ROS Review of Systems  Constitutional:  Negative for activity change, appetite change, chills, fatigue and fever.  HENT:  Negative for congestion, postnasal drip, rhinorrhea, sinus pressure, sinus pain, sneezing and sore throat.   Eyes: Negative.   Respiratory:  Negative for cough, chest tightness, shortness of breath and wheezing.   Cardiovascular:  Negative for chest pain and palpitations.  Gastrointestinal:  Positive for abdominal pain and constipation. Negative for diarrhea, nausea and vomiting.       Pain seems to be associated with needing to have a bowel movement. Occurs about once every 2 weeks.   Endocrine: Negative for cold intolerance, heat intolerance, polydipsia and polyuria.  Genitourinary:  Negative for dyspareunia, dysuria, flank pain, frequency and urgency.  Musculoskeletal:  Negative for arthralgias, back pain and myalgias.  Skin:  Negative for rash.       Cyst like lesion in middle of her back which is reported to be itchy. It is gradually getting larger. Non painful at this time.   Allergic/Immunologic: Positive for environmental allergies.  Neurological:  Negative for dizziness, weakness and headaches.  Hematological:  Negative  for adenopathy.  Psychiatric/Behavioral:  The patient is not nervous/anxious.      Objective:    Physical Exam Vitals and nursing note reviewed.  Constitutional:      Appearance: Normal appearance. She is well-developed.  HENT:     Head: Normocephalic and atraumatic.     Nose: Nose normal.     Mouth/Throat:     Mouth: Mucous membranes are moist.  Eyes:     Extraocular Movements: Extraocular movements intact.     Conjunctiva/sclera: Conjunctivae normal.     Pupils: Pupils are equal, round, and reactive to light.  Cardiovascular:  Rate and Rhythm: Normal rate and regular rhythm.     Pulses: Normal pulses.     Heart sounds: Normal heart sounds.  Pulmonary:     Effort: Pulmonary effort is normal.     Breath sounds: Normal breath sounds.  Abdominal:     General: Bowel sounds are normal. There is no distension.     Palpations: Abdomen is soft. There is no mass.     Tenderness: There is abdominal tenderness. There is no guarding or rebound.     Hernia: No hernia is present.     Comments: Mild tenderness with palpation of the right and left lower quadrants of the abodmen.   Musculoskeletal:        General: Normal range of motion.     Cervical back: Normal range of motion and neck supple.  Lymphadenopathy:     Cervical: No cervical adenopathy.  Skin:    General: Skin is warm and dry.     Capillary Refill: Capillary refill takes less than 2 seconds.     Comments: There is fluid filled cyst in the center of the back, adjacent to the spine. It is round and soft, and slightly tender to palpate. Skin intact with no drainage present at this time.   Neurological:     General: No focal deficit present.     Mental Status: She is alert and oriented to person, place, and time.  Psychiatric:        Mood and Affect: Mood normal.        Behavior: Behavior normal.        Thought Content: Thought content normal.        Judgment: Judgment normal.   Today's Vitals   09/07/21 0818  BP:  134/83  Pulse: 78  Temp: 98.3 F (36.8 C)  SpO2: 98%  Weight: 227 lb 14.4 oz (103.4 kg)  Height: 5' 8.9" (1.75 m)   Body mass index is 33.75 kg/m.   Wt Readings from Last 3 Encounters:  09/07/21 227 lb 14.4 oz (103.4 kg)  04/08/21 225 lb 4.8 oz (102.2 kg)  03/05/21 225 lb 4.8 oz (102.2 kg)     Health Maintenance Due  Topic Date Due   COVID-19 Vaccine (1) Never done    There are no preventive care reminders to display for this patient.  Lab Results  Component Value Date   TSH 1.890 12/10/2020   Lab Results  Component Value Date   WBC 4.7 12/10/2020   HGB 16.6 (H) 12/10/2020   HCT 47.9 (H) 12/10/2020   MCV 91 12/10/2020   PLT 176 12/10/2020   Lab Results  Component Value Date   NA 138 12/10/2020   K 4.1 12/10/2020   CO2 20 12/10/2020   GLUCOSE 93 12/10/2020   BUN 8 12/10/2020   CREATININE 0.82 12/10/2020   BILITOT 0.5 12/10/2020   ALKPHOS 75 12/10/2020   AST 18 12/10/2020   ALT 24 12/10/2020   PROT 7.0 12/10/2020   ALBUMIN 4.3 12/10/2020   CALCIUM 9.3 12/10/2020   ANIONGAP 10 09/08/2019   EGFR 92 12/10/2020   Lab Results  Component Value Date   CHOL 130 12/10/2020   Lab Results  Component Value Date   HDL 33 (L) 12/10/2020   Lab Results  Component Value Date   LDLCALC 76 12/10/2020   Lab Results  Component Value Date   TRIG 115 12/10/2020   Lab Results  Component Value Date   CHOLHDL 3.9 12/10/2020   Lab Results  Component Value Date   HGBA1C 5.6 12/10/2020      Assessment & Plan:  1. Sebaceous cyst Fluid filled cyst in the center of the back, adjacent to the spine. Refer to dermatology for further evaluation and treatment . - Ambulatory referral to Dermatology  2. Generalized abdominal tenderness without rebound tenderness Tenderness with palpation of the abdomen. Will get ultrasound for further evaluation.  - US Abdomen Complete; Future  3. Mild intermittent asthma without complication Stable. Continue inhalers and respiratory  medications as prescribed  - albuterol (VENTOLIN HFA) 108 (90 Base) MCG/ACT inhaler; Inhale 2 puffs into the lungs every 6 (six) hours as needed for wheezing or shortness of breath.  Dispense: 1 each; Refill: 2 - budesonide-formoterol (SYMBICORT) 160-4.5 MCG/ACT inhaler; Inhale 2 puffs into the lungs 2 (two) times daily.  Dispense: 3 each; Refill: 1  4. Seasonal allergies Continue allergy medications as previously prescribed.  - cetirizine (ZYRTEC) 10 MG tablet; Take 1 tablet (10 mg total) by mouth daily.  Dispense: 90 tablet; Refill: 1 - fluticasone (FLONASE) 50 MCG/ACT nasal spray; SPRAY 2 SPRAYS INTO EACH NOSTRIL EVERY DAY  Dispense: 16 mL; Refill: 3 - montelukast (SINGULAIR) 10 MG tablet; Take 1 tablet (10 mg total) by mouth at bedtime.  Dispense: 90 tablet; Refill: 3  5. Body mass index (BMI) of 33.0-33.9 in adult Discussed lowering calorie intake to 1500 calories per day and incorporating exercise into daily routine to help lose weight.   6. Acute left-sided low back pain without sciatica May take voltaren 55m up to twice daily if needed for pain and inflammation. Baclofen may ben take up to three times daily if needed for muscle pain and tightness. Refills sent to pharmacy today.  - diclofenac (VOLTAREN) 75 MG EC tablet; Take 1 tablet (75 mg total) by mouth 2 (two) times daily.  Dispense: 90 tablet; Refill: 1 - baclofen (LIORESAL) 10 MG tablet; Take 1 tablet (10 mg total) by mouth 3 (three) times daily.  Dispense: 90 each; Refill: 1  7. Cold sore Take valacyclovir daily to prevent outbreak of cold sores.  - valACYclovir (VALTREX) 500 MG tablet; Take 2 tablets at onset of cold sore, then 1 tablet BID until resolved; repeat as needed  Dispense: 90 tablet; Refill: 1  8. Need for influenza vaccination F,lu vaccine administered today.  - Flu Vaccine QUAD 6+ mos PF IM (Fluarix Quad PF)   Problem List Items Addressed This Visit       Respiratory   Mild intermittent asthma without  complication   Relevant Medications   albuterol (VENTOLIN HFA) 108 (90 Base) MCG/ACT inhaler   budesonide-formoterol (SYMBICORT) 160-4.5 MCG/ACT inhaler   montelukast (SINGULAIR) 10 MG tablet     Digestive   Cold sore   Relevant Medications   valACYclovir (VALTREX) 500 MG tablet     Other   Body mass index (BMI) of 33.0-33.9 in adult   Generalized abdominal tenderness without rebound tenderness   Relevant Orders   [place]dom[place] Complete   Seasonal allergies   Relevant Medications   cetirizine (ZYRTEC) 10 MG tablet   fluticasone (FLONASE) 50 MCG/ACT nasal spray   montelukast (SINGULAIR) 10 MG tablet   Acute left-sided low back pain without sciatica   Relevant Medications   diclofenac (VOLTAREN) 75 MG EC tablet   baclofen (LIORESAL) 10 MG tablet   Other Visit Diagnoses     Sebaceous cyst    -  Primary   Relevant Orders   Ambulatory referral to Dermatology   Need  for influenza vaccination       Relevant Orders   Flu Vaccine QUAD 6+ mos PF IM (Fluarix Quad PF) (Completed)       Meds ordered this encounter  Medications   albuterol (VENTOLIN HFA) 108 (90 Base) MCG/ACT inhaler    Sig: Inhale 2 puffs into the lungs every 6 (six) hours as needed for wheezing or shortness of breath.    Dispense:  1 each    Refill:  2    Order Specific Question:   Supervising Provider    Answer:   Beatrice Lecher D [[id number]]   budesonide-formoterol (SYMBICORT) 160-4.5 MCG/ACT inhaler    Sig: Inhale 2 puffs into the lungs 2 (two) times daily.    Dispense:  3 each    Refill:  1    Order Specific Question:   Supervising Provider    Answer:   Beatrice Lecher D [[id number]]   cetirizine (ZYRTEC) 10 MG tablet    Sig: Take 1 tablet (10 mg total) by mouth daily.    Dispense:  90 tablet    Refill:  1    Order Specific Question:   Supervising Provider    Answer:   Beatrice Lecher D [[id number]]   fluticasone (FLONASE) 50 MCG/ACT nasal spray    Sig: SPRAY 2 SPRAYS INTO EACH NOSTRIL EVERY DAY     Dispense:  16 mL    Refill:  3    Order Specific Question:   Supervising Provider    Answer:   Beatrice Lecher D [[id number]]   montelukast (SINGULAIR) 10 MG tablet    Sig: Take 1 tablet (10 mg total) by mouth at bedtime.    Dispense:  90 tablet    Refill:  3    Order Specific Question:   Supervising Provider    Answer:   Hali Marry [[id number]]   diclofenac (VOLTAREN) 75 MG EC tablet    Sig: Take 1 tablet (75 mg total) by mouth 2 (two) times daily.    Dispense:  90 tablet    Refill:  1    Order Specific Question:   Supervising Provider    Answer:   Beatrice Lecher D [[id number]]   valACYclovir (VALTREX) 500 MG tablet    Sig: Take 2 tablets at onset of cold sore, then 1 tablet BID until resolved; repeat as needed    Dispense:  90 tablet    Refill:  1    Order Specific Question:   Supervising Provider    Answer:   Beatrice Lecher D [[id number]]   baclofen (LIORESAL) 10 MG tablet    Sig: Take 1 tablet (10 mg total) by mouth 3 (three) times daily.    Dispense:  90 each    Refill:  1    Order Specific Question:   Supervising Provider    Answer:   Beatrice Lecher D [[id number]]    Follow-up: Return in about 4 weeks (around 10/05/2021) for abdominal pain .    Ronnell Freshwater, NP

## 2021-09-07 NOTE — Patient Instructions (Signed)
Fat and Cholesterol Restricted Eating Plan Getting too much fat and cholesterol in your diet may cause health problems. Choosing the right foods helps keep your fat and cholesterol at normal levels. This can keep you from getting certain diseases. Your doctor may recommend an eating plan that includes: Total fat: ______% or less of total calories a day. This is ______g of fat a day. Saturated fat: ______% or less of total calories a day. This is ______g of saturated fat a day. Cholesterol: less than _________mg a day. Fiber: ______g a day. What are tips for following this plan? General tips Work with your doctor to lose weight if you need to. Avoid: Foods with added sugar. Fried foods. Foods with trans fat or partially hydrogenated oils. This includes some margarines and baked goods. If you drink alcohol: Limit how much you have to: 0-1 drink a day for women who are not pregnant. 0-2 drinks a day for men. Know how much alcohol is in a drink. In the [place] one drink equals one 12 oz bottle of beer (355 mL), one 5 oz glass of wine (148 mL), or one 1 oz glass of hard liquor (44 mL). Reading food labels Check food labels for: Trans fats. Partially hydrogenated oils. Saturated fat (g) in each serving. Cholesterol (mg) in each serving. Fiber (g) in each serving. Choose foods with healthy fats, such as: Monounsaturated fats and polyunsaturated fats. These include olive and canola oil, flaxseeds, walnuts, almonds, and seeds. Omega-3 fats. These are found in certain fish, flaxseed oil, and ground flaxseeds. Choose grain products that have whole grains. Look for the word "whole" as the first word in the ingredient list. Cooking Cook foods using low-fat methods. These include baking, boiling, grilling, and broiling. Eat more home-cooked foods. Eat at restaurants and buffets less often. Eat less fast food. Avoid cooking using saturated fats, such as butter, cream, palm oil, palm kernel oil, and  coconut oil. Meal planning  At meals, divide your plate into four equal parts: Fill one-half of your plate with vegetables, green salads, and fruit. Fill one-fourth of your plate with whole grains. Fill one-fourth of your plate with low-fat (lean) protein foods. Eat fish that is high in omega-3 fats at least two times a week. This includes mackerel, tuna, sardines, and salmon. Eat foods that are high in fiber, such as whole grains, beans, apples, pears, berries, broccoli, carrots, peas, and barley. What foods should I eat? Fruits All fresh, canned (in natural juice), or frozen fruits. Vegetables Fresh or frozen vegetables (raw, steamed, roasted, or grilled). Green salads. Grains Whole grains, such as whole wheat or whole grain breads, crackers, cereals, and pasta. Unsweetened oatmeal, bulgur, barley, quinoa, or brown rice. Corn or whole wheat flour tortillas. Meats and other protein foods Ground beef (85% or leaner), grass-fed beef, or beef trimmed of fat. Skinless chicken or turkey. Ground chicken or turkey. Pork trimmed of fat. All fish and seafood. Egg whites. Dried beans, peas, or lentils. Unsalted nuts or seeds. Unsalted canned beans. Nut butters without added sugar or oil. Dairy Low-fat or nonfat dairy products, such as skim or 1% milk, 2% or reduced-fat cheeses, low-fat and fat-free ricotta or cottage cheese, or plain low-fat and nonfat yogurt. Fats and oils Tub margarine without trans fats. Light or reduced-fat mayonnaise and salad dressings. Avocado. Olive, canola, sesame, or safflower oils. The items listed above may not be a complete list of foods and beverages you can eat. Contact a dietitian for more information. What foods   should I avoid? Fruits Canned fruit in heavy syrup. Fruit in cream or butter sauce. Fried fruit. Vegetables Vegetables cooked in cheese, cream, or butter sauce. Fried vegetables. Grains White bread. White pasta. White rice. Cornbread. Bagels, pastries,  and croissants. Crackers and snack foods that contain trans fat and hydrogenated oils. Meats and other protein foods Fatty cuts of meat. Ribs, chicken wings, bacon, sausage, bologna, salami, chitterlings, fatback, hot dogs, bratwurst, and packaged lunch meats. Liver and organ meats. Whole eggs and egg yolks. Chicken and turkey with skin. Fried meat. Dairy Whole or 2% milk, cream, half-and-half, and cream cheese. Whole milk cheeses. Whole-fat or sweetened yogurt. Full-fat cheeses. Nondairy creamers and whipped toppings. Processed cheese, cheese spreads, and cheese curds. Fats and oils Butter, stick margarine, lard, shortening, ghee, or bacon fat. Coconut, palm kernel, and palm oils. Beverages Alcohol. Sugar-sweetened drinks such as sodas, lemonade, and fruit drinks. Sweets and desserts Corn syrup, sugars, honey, and molasses. Candy. Jam and jelly. Syrup. Sweetened cereals. Cookies, pies, cakes, donuts, muffins, and ice cream. The items listed above may not be a complete list of foods and beverages you should avoid. Contact a dietitian for more information. Summary Choosing the right foods helps keep your fat and cholesterol at normal levels. This can keep you from getting certain diseases. At meals, fill one-half of your plate with vegetables, green salads, and fruits. Eat high fiber foods, like whole grains, beans, apples, pears, berries, carrots, peas, and barley. Limit added sugar, saturated fats, alcohol, and fried foods. This information is not intended to replace advice given to you by your health care provider. Make sure you discuss any questions you have with your health care provider. Document Revised: 12/26/2020 Document Reviewed: 12/26/2020 Elsevier Patient Education  2022 [employer]  

## 2021-09-08 ENCOUNTER — Other Ambulatory Visit: Payer: Self-pay | Admitting: Nurse Practitioner

## 2021-09-08 DIAGNOSIS — R1084 Generalized abdominal pain: Secondary | ICD-10-CM

## 2021-09-10 ENCOUNTER — Ambulatory Visit
Admission: RE | Admit: 2021-09-10 | Discharge: 2021-09-10 | Disposition: A | Discharge status: Home / Self Care | Payer: Managed Care, Other (non HMO) | Source: Ambulatory Visit | Attending: Nurse Practitioner | Admitting: Nurse Practitioner

## 2021-09-10 ENCOUNTER — Other Ambulatory Visit: Payer: Self-pay | Admitting: Nurse Practitioner

## 2021-09-10 ENCOUNTER — Other Ambulatory Visit: Payer: Self-pay

## 2021-09-10 DIAGNOSIS — N838 Other noninflammatory disorders of ovary, fallopian tube and broad ligament: Secondary | ICD-10-CM

## 2021-09-10 DIAGNOSIS — R1084 Generalized abdominal pain: Secondary | ICD-10-CM

## 2021-09-10 DIAGNOSIS — R10817 Generalized abdominal tenderness: Secondary | ICD-10-CM

## 2021-09-10 DIAGNOSIS — N2889 Other specified disorders of kidney and ureter: Secondary | ICD-10-CM

## 2021-09-10 DIAGNOSIS — D487 Neoplasm of uncertain behavior of other specified sites: Secondary | ICD-10-CM

## 2021-09-10 IMAGING — US US ABDOMEN COMPLETE
1 series · 13 of 25 positions shown · non-contrast
Comparison: None.

CLINICAL DATA: Generalized abdominal pain

EXAM:
ABDOMEN ULTRASOUND COMPLETE

[Series 1: us abdomen complete · 0.21mm/px · 13 of 107 slices shown]
[im 1/107]
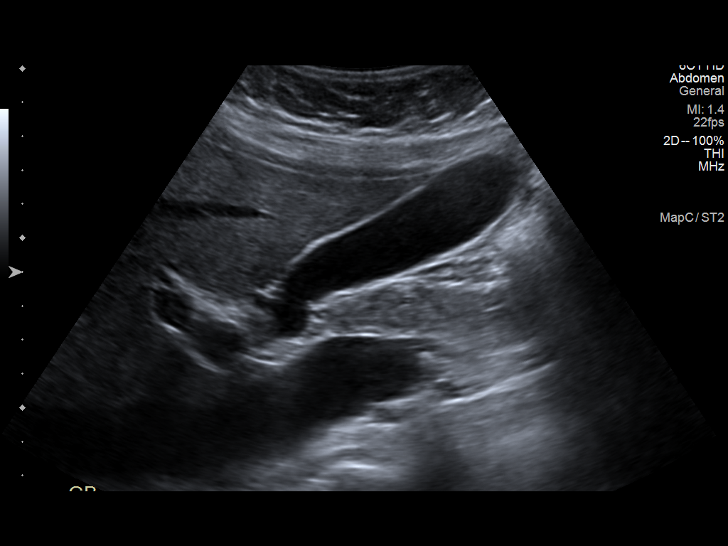
[im 9/107]
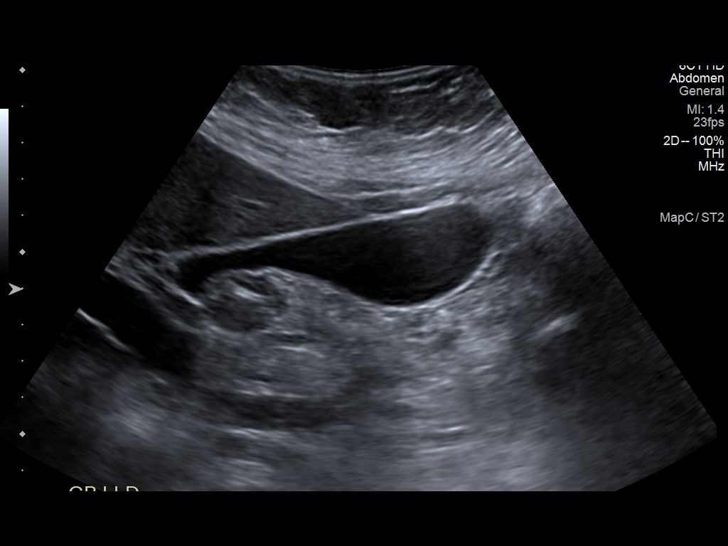
[im 18/107]
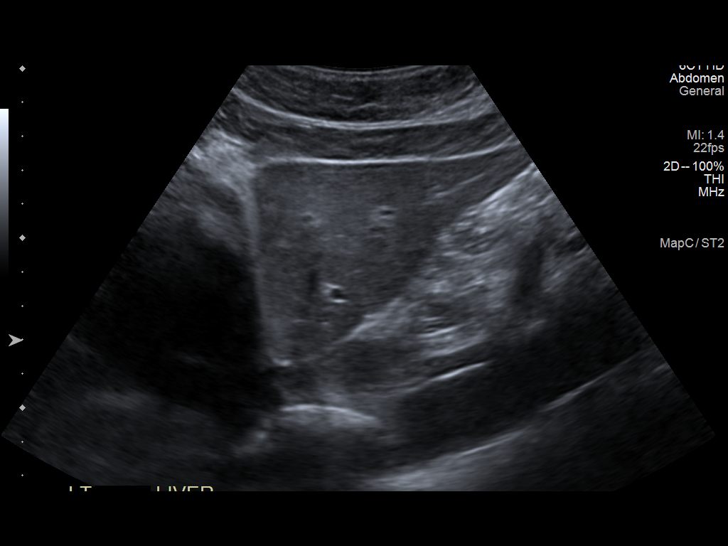
[im 27/107]
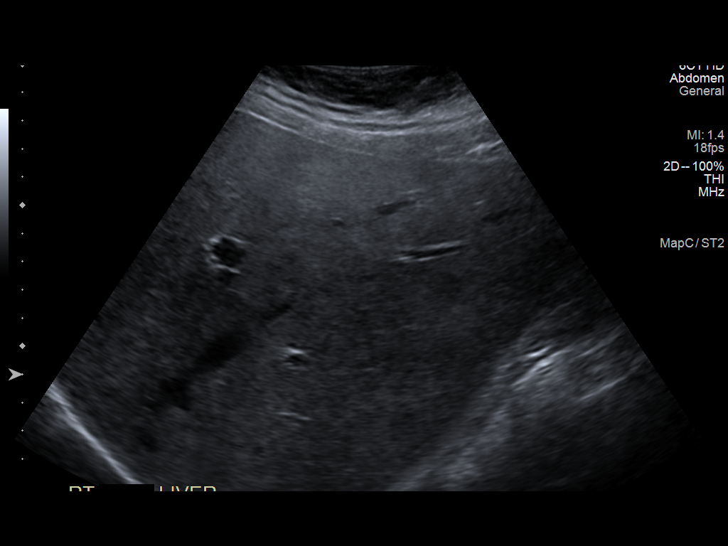
[im 36/107]
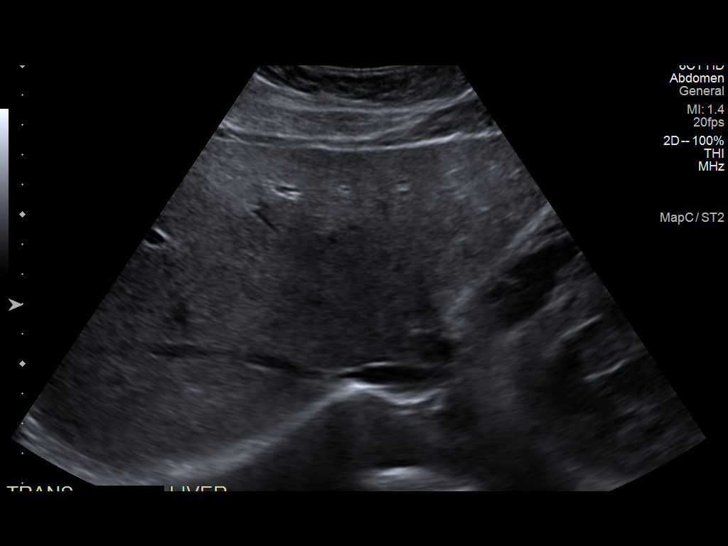
[im 45/107]
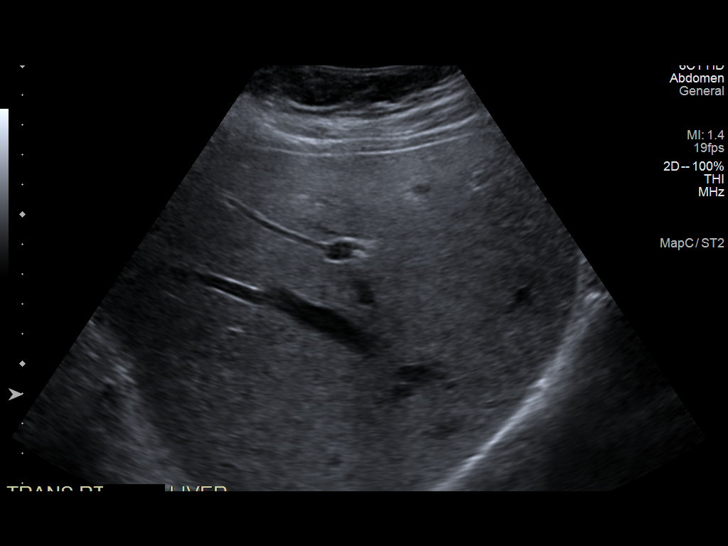
[im 54/107]
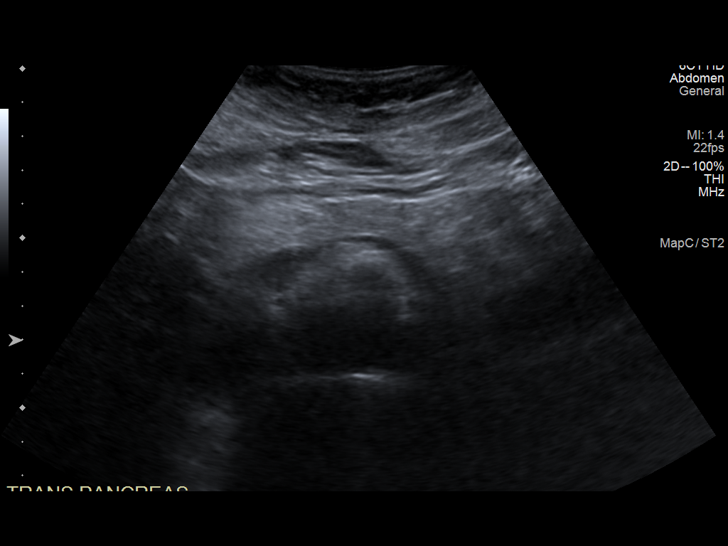
[im 62/107]
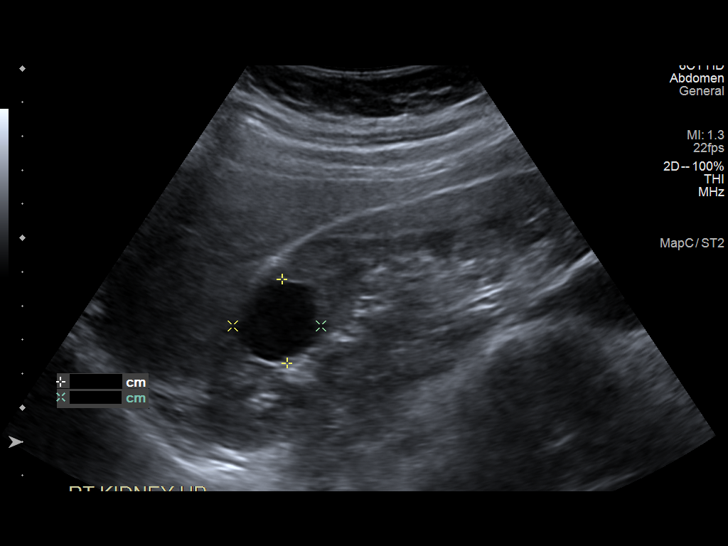
[im 71/107]
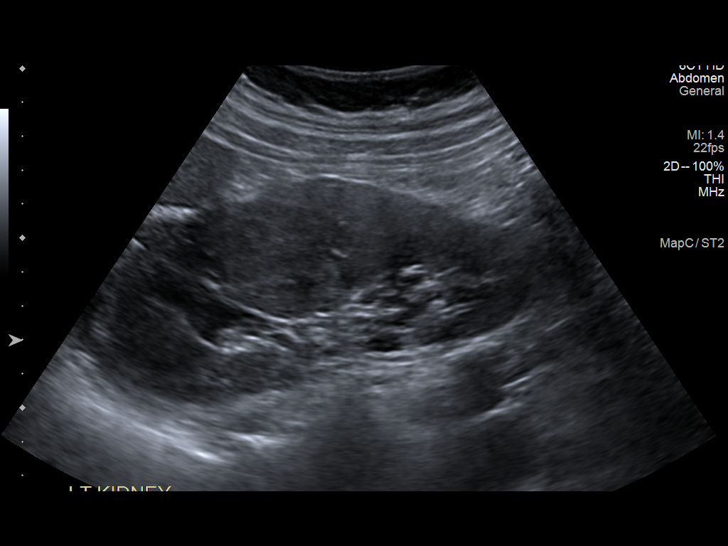
[im 80/107]
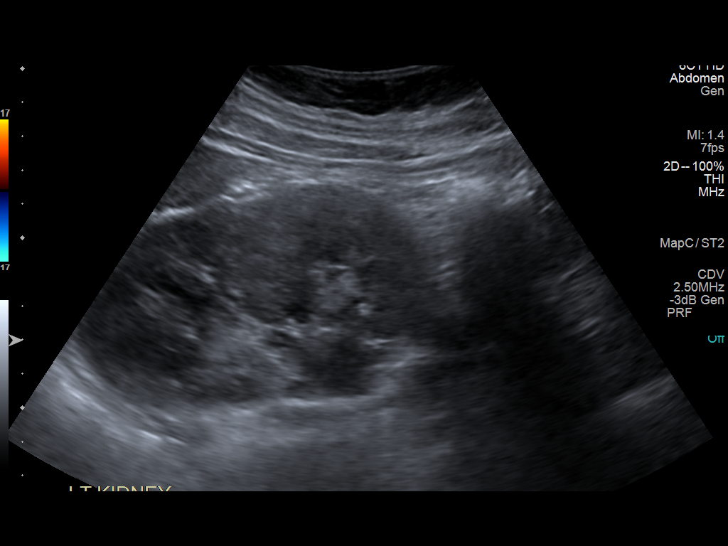
[im 89/107]
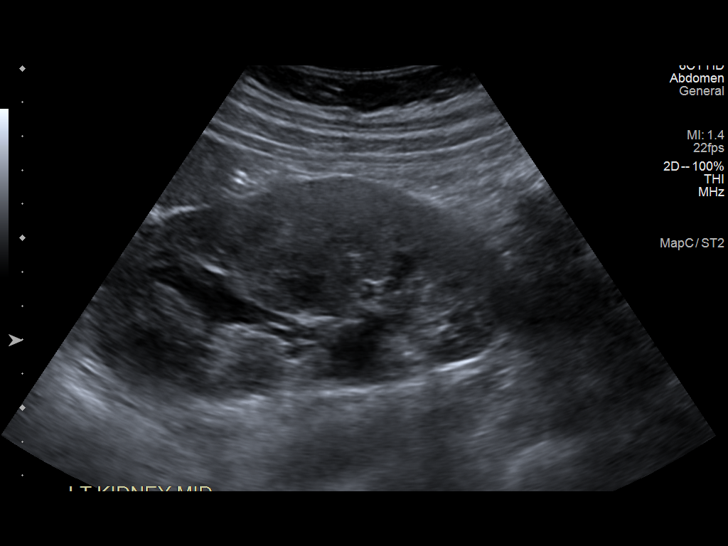
[im 98/107]
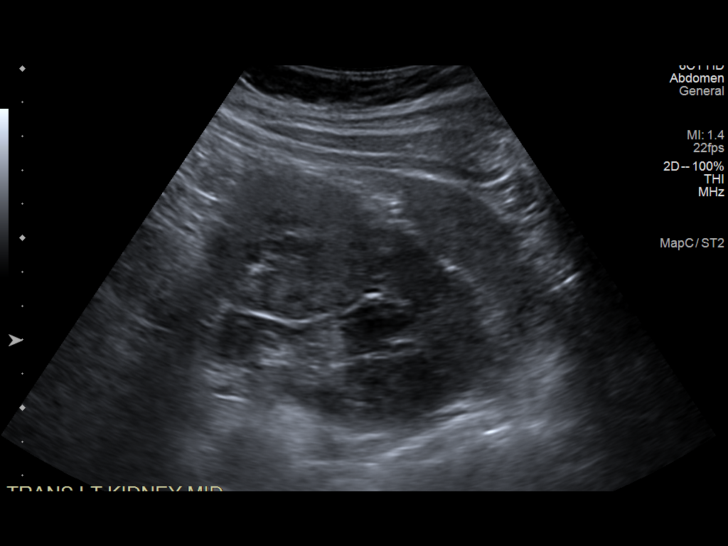
[im 107/107]
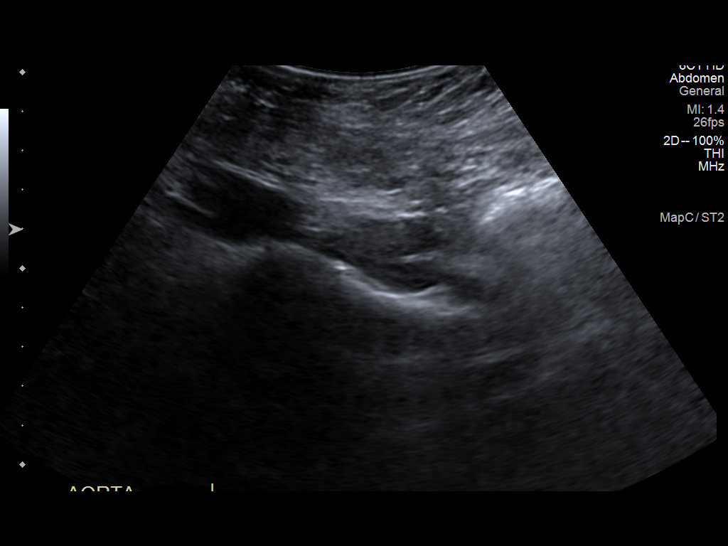

[13 of 25 positions shown; findings below may reference images not displayed]

FINDINGS: Gallbladder: No gallstones or wall thickening visualized. No
sonographic Murphy sign noted by sonographer.

Common bile duct: Diameter: 0.4 cm

Liver: No focal lesion identified. Within normal limits in
parenchymal echogenicity. Portal vein is patent on color Doppler
imaging with normal direction of blood flow towards the liver.

IVC: No abnormality visualized.

Pancreas: Visualized portion unremarkable.

Spleen: Size and appearance within normal limits.

Right Kidney: Length: 13.1 cm. Normal echogenicity. No
hydronephrosis. There is a 2.7 cm anechoic lesion in the upper pole
compatible with a simple renal cyst.

Left Kidney: Length: 13.1 cm. In the mid left kidney, there is a
x 5.1 x 4.2 cm mass that is isoechoic to slightly hyperechoic
relative to the renal cortex.

Abdominal aorta: No aneurysm visualized.

Other findings: None.
IMPRESSION: In the left kidney there is an interpolar 5.7 cm indeterminate mass.
Further characterization with CT or MRI renal protocol is
recommended.

These results will be called to the ordering clinician or
representative by the Radiologist Assistant, and communication
documented in the PACS or [REDACTED].

## 2021-09-10 IMAGING — US US PELVIS COMPLETE
1 series · 13 of 25 positions shown · non-contrast
Comparison: None.

CLINICAL DATA: Pelvic pain. Bilateral lower quadrant abdominal
tenderness. History of hysterectomy.

EXAM:
TRANSABDOMINAL ULTRASOUND OF PELVIS
TECHNIQUE: Transabdominal ultrasound examination of the pelvis was performed
including evaluation of the uterus, ovaries, adnexal regions, and
pelvic cul-de-sac.

[Series 1: us pelvis complete · 0.24mm/px · 13 of 29 slices shown]
[im 1/29]
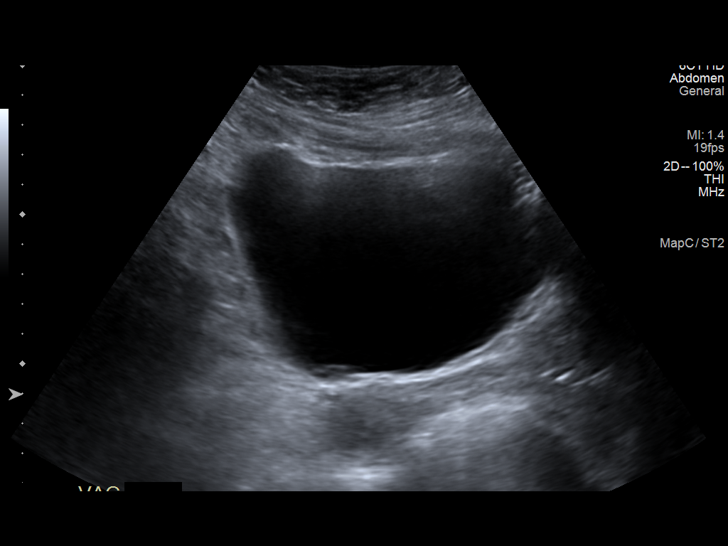
[im 3/29]
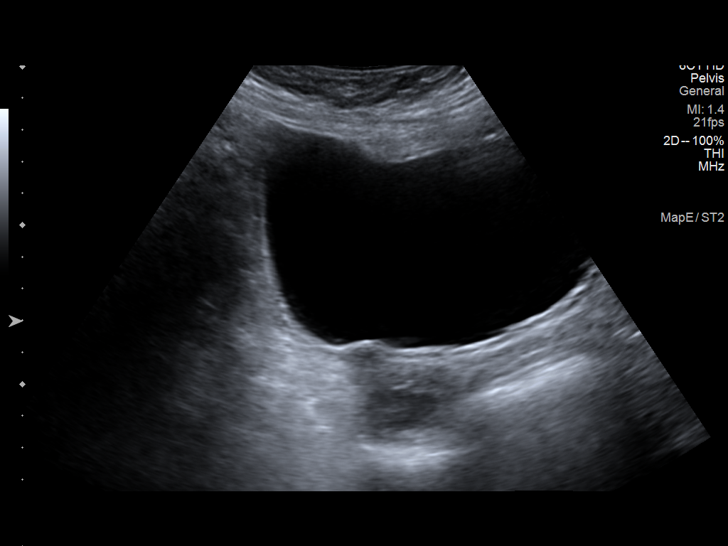
[im 5/29]
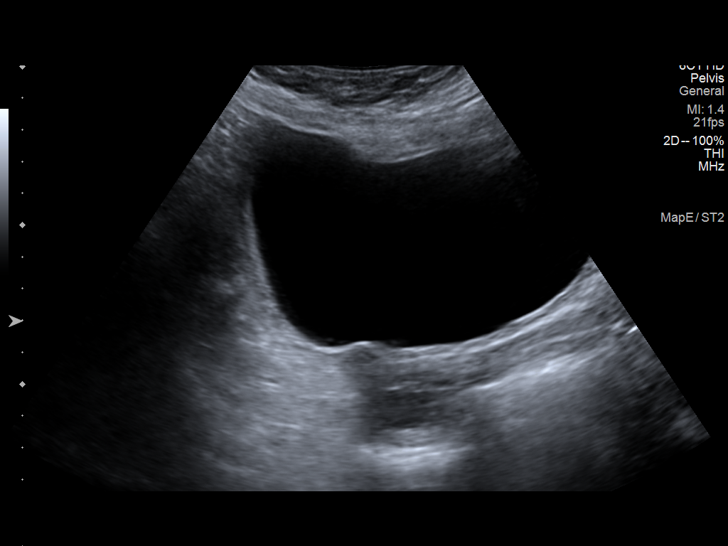
[im 8/29]
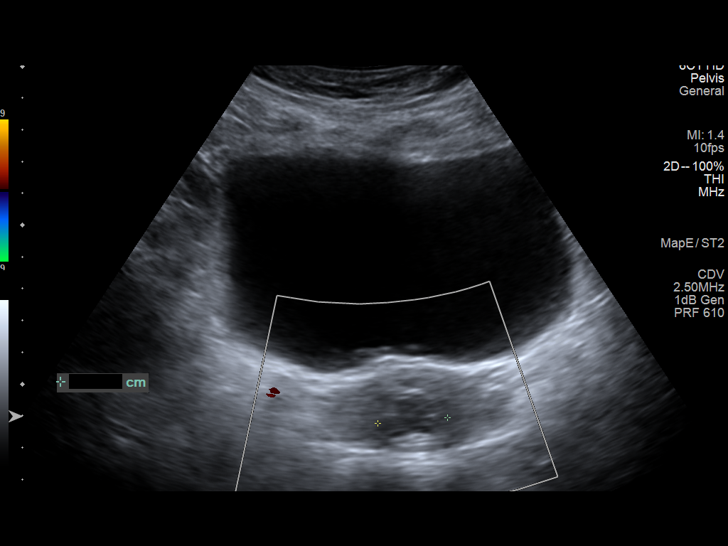
[im 10/29]
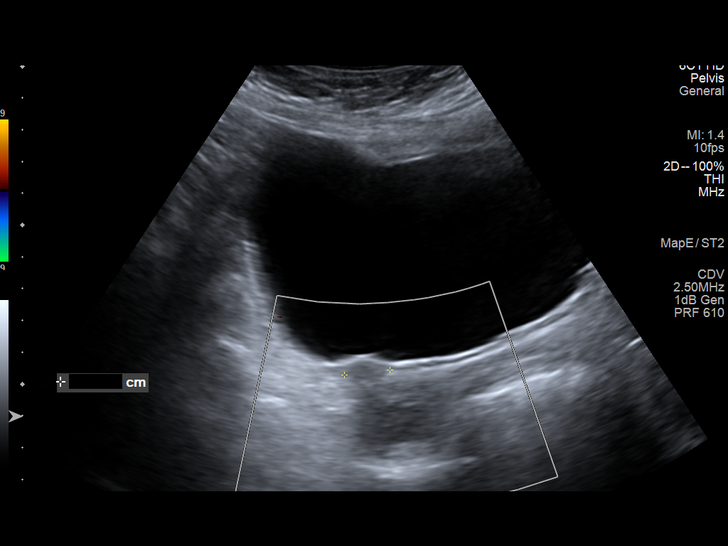
[im 12/29]
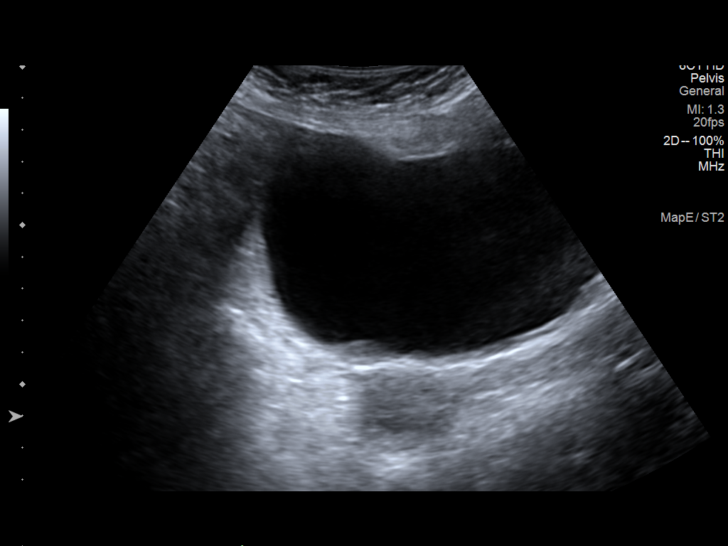
[im 15/29]
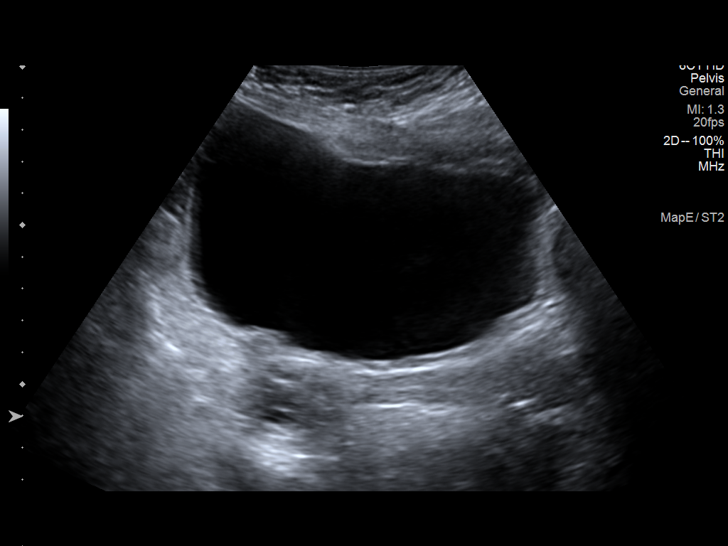
[im 17/29]
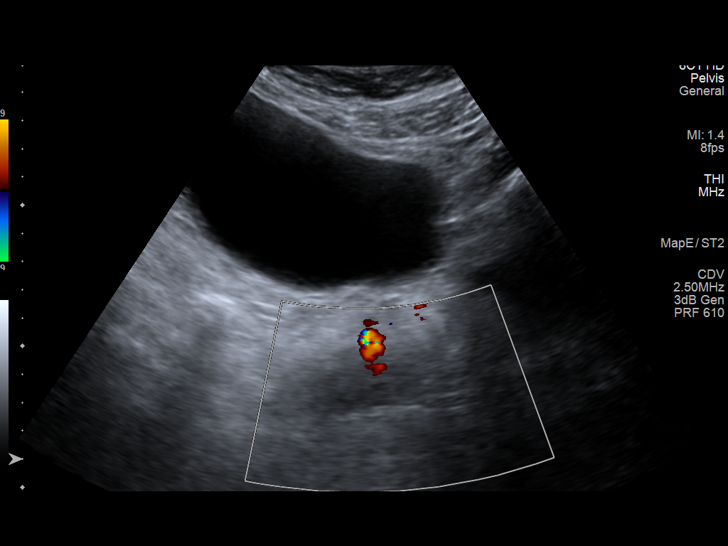
[im 19/29]
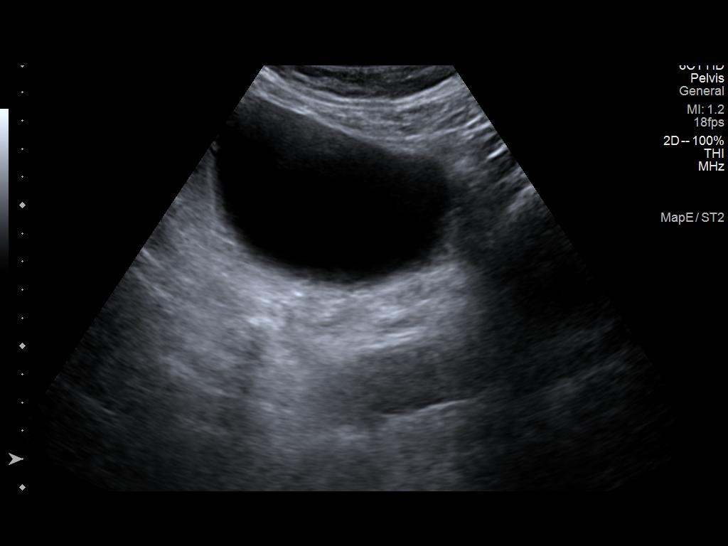
[im 22/29]
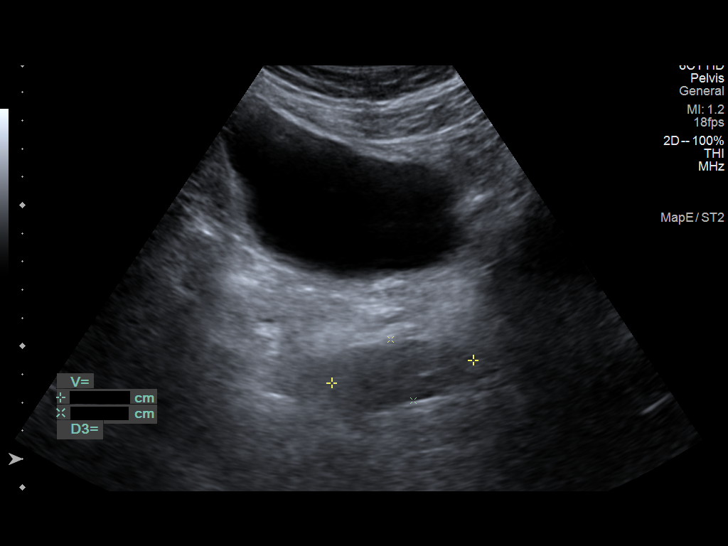
[im 24/29]
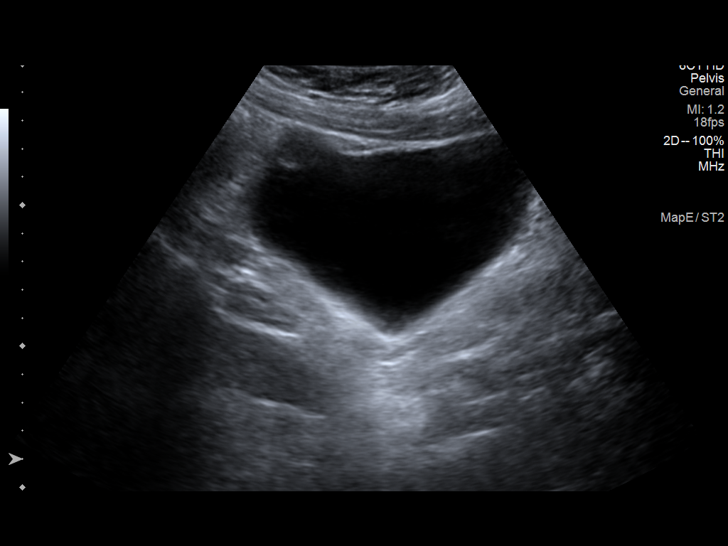
[im 26/29]
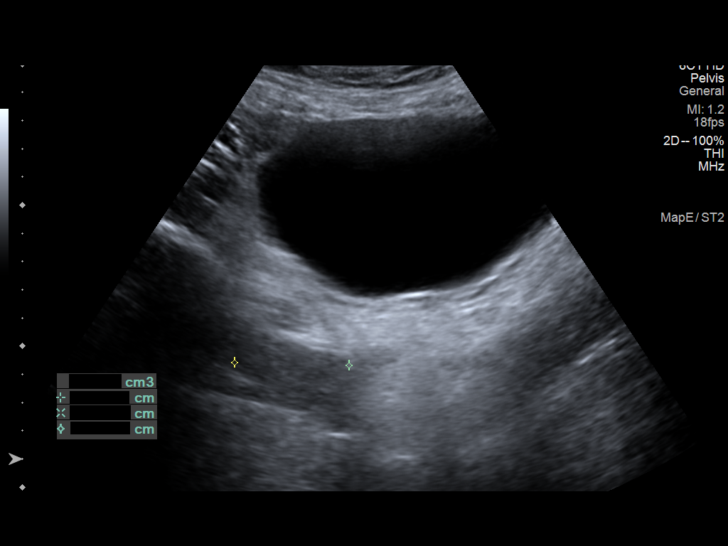
[im 29/29]
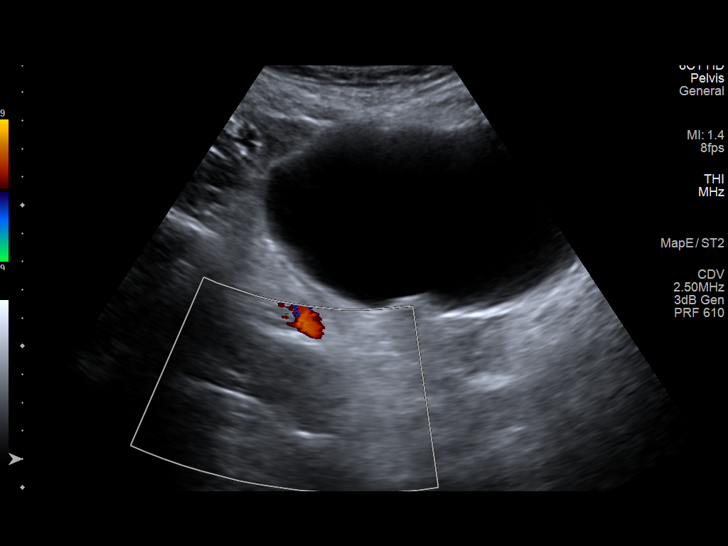

[13 of 25 positions shown; findings below may reference images not displayed]

FINDINGS: Uterus

Surgically absent. In the posterior aspect of the vaginal cuff is a
poorly defined hypoechoic nodule measuring 2.2 x 1.5 x 1.7 cm. This
is incompletely characterized on this transabdominal exam. There is
also a slightly more hyperechoic nodule in the superior aspect of
the vaginal cuff measuring 1.5 x 1.1 x 1.4 cm that causes mild mass
effect on the bladder base.

Endometrium

Surgically absent.

Right ovary

Measurements: 4.7 x 2.4 x 4.1 cm = volume: 24 mL. No dominant cyst
or solid lesion, although the ovaries are difficult to delineate on
this transabdominal exam. Ovarian blood flow is seen. No extra
ovarian adnexal mass.

Left ovary

Measurements: 6.1 x 2.6 x 4.5 cm = volume: 33 mL. No dominant cyst
or solid lesion, although the ovaries are difficult to delineate on
transabdominal exam. Ovarian blood flow is seen. No extra ovarian
adnexal mass.

Other findings:  No abnormal free fluid.
IMPRESSION: 1. Post hysterectomy. Two nodules are present in the vaginal cuff,
larger measuring 2.2 cm. This is incompletely characterized on this
transabdominal exam. Consider pelvic MRI for characterization.
2. Both ovaries are prominent in size, although no definite cystic
or solid lesion is demonstrated. The ovaries are poorly visualized
on this transabdominal exam, could also be further assessed with
MRI.

## 2021-09-10 NOTE — Progress Notes (Signed)
Need to call patient to discuss these results and recommendation for further evaluation.

## 2021-09-10 NOTE — Progress Notes (Signed)
Called and spoke with patent on the phone and notified her of ultrasound results. Ad Abdominal MRI has been ordered for further evaluation of renal mass. Patient is aware

## 2021-09-10 NOTE — Progress Notes (Signed)
Spoke with patient over the phone regarding results. Two nodular densities and prominent ovaries present. Will get Pelvic MRI ordered for further evaluation. Patient is in agreement with this plan.

## 2021-09-10 NOTE — Progress Notes (Signed)
Abdominal MRI ordered due to interpolar 5.7 cm indeterminate mass in the left kidney.  Pelvic MRI ordered because there are two nodules present in the vaginal cuff,  larger measuring 2.2 cm. This is incompletely characterized on this  transabdominal exam.  2. Both ovaries are prominent in size, although no definite cystic  or solid lesion is demonstrated. The ovaries are poorly visualized  on this transabdominal exam, could also be further assessed with  MRI.  I spoke with patient on the telephone to inform her of ultrasound results and to advise her regarding the orders for MRIs to be ordered of the abdomen and pelvis. She voiced understanding and agreement with the upcoming plan.

## 2021-09-15 ENCOUNTER — Other Ambulatory Visit: Payer: Self-pay | Admitting: Nurse Practitioner

## 2021-09-15 DIAGNOSIS — N2889 Other specified disorders of kidney and ureter: Secondary | ICD-10-CM

## 2021-09-17 ENCOUNTER — Ambulatory Visit
Admission: RE | Admit: 2021-09-17 | Discharge: 2021-09-17 | Disposition: A | Discharge status: Home / Self Care | Payer: Managed Care, Other (non HMO) | Source: Ambulatory Visit | Attending: Nurse Practitioner | Admitting: Nurse Practitioner

## 2021-09-17 ENCOUNTER — Other Ambulatory Visit: Payer: Managed Care, Other (non HMO)

## 2021-09-17 ENCOUNTER — Other Ambulatory Visit: Payer: Self-pay

## 2021-09-17 DIAGNOSIS — D487 Neoplasm of uncertain behavior of other specified sites: Secondary | ICD-10-CM

## 2021-09-17 DIAGNOSIS — N838 Other noninflammatory disorders of ovary, fallopian tube and broad ligament: Secondary | ICD-10-CM

## 2021-09-17 IMAGING — MR MR PELVIS WO/W CM
19 of 20 series · 43 of 48 positions shown · IV contrast (multihance)
Comparison: Ultrasound exam [DATE]

CLINICAL DATA: Nodules identified in the vaginal cuff on recent
ultrasound.

EXAM:
MRI PELVIS WITHOUT AND WITH CONTRAST
TECHNIQUE: Multiplanar multisequence MR imaging of the pelvis was performed
both before and after administration of intravenous contrast.
CONTRAST:  20mL MULTIHANCE GADOBENATE DIMEGLUMINE 529 MG/ML IV SOLN

[Series 2: T2 · coronal · 5.0mm · 1.25mm/px · 1 of 30 slices shown (1 of 4)]
[im 1/30]
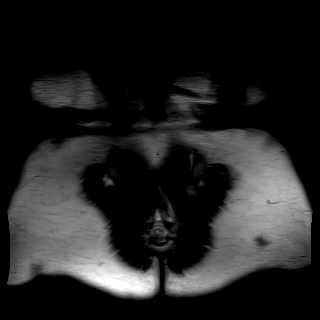

[Series 3: T2 · axial · 5.0mm · 0.41mm/px · 1 of 30 slices shown (2 of 4)]
[im 1/30]
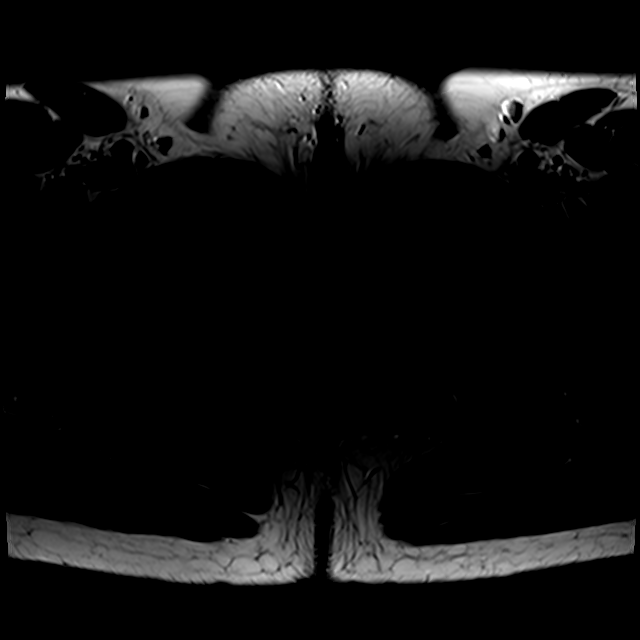

[Series 4: T2 fat-sat · axial · 5.0mm · 0.41mm/px · 1 of 30 slices shown]
[im 1/30]
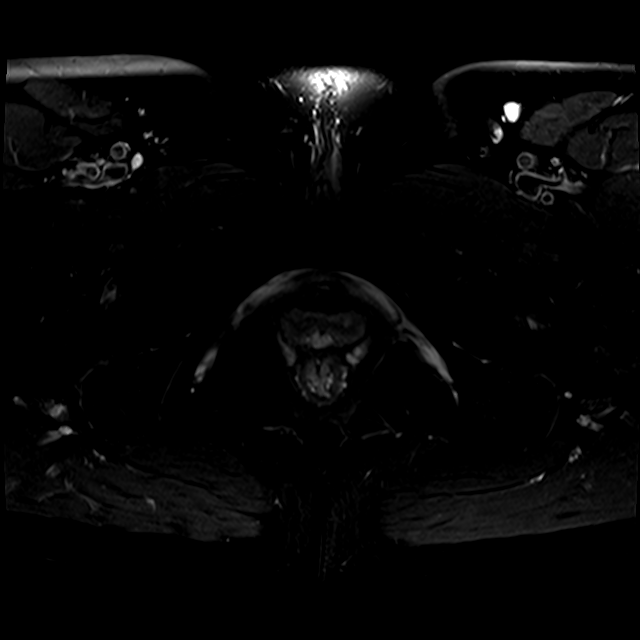

[Series 5: T2 · sagittal · 5.0mm · 0.78mm/px · 1 of 33 slices shown (3 of 4)]
[im 1/33]
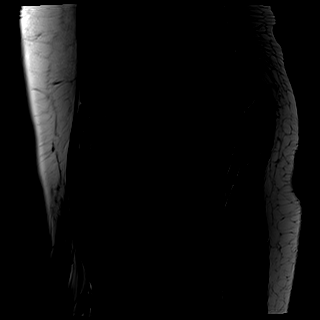

[Series 6: ax in out · axial · 3.5mm · 0.74mm/px · z∈[-136,+65]mm · 2 of 102 slices shown]
[im 1/102]
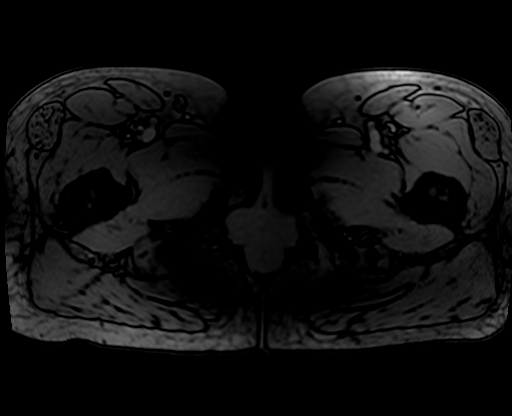
[im 102/102]
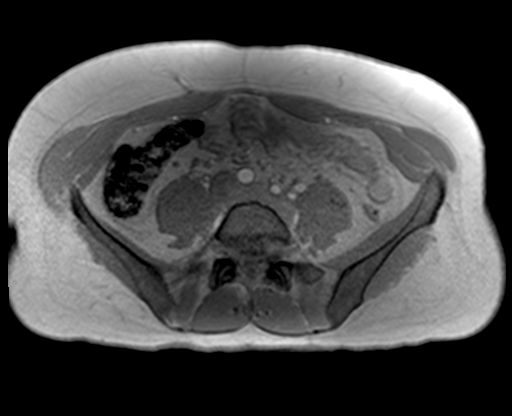

[Series 7: T2 · coronal · 5.0mm · 0.81mm/px · 1 of 30 slices shown (4 of 4)]
[im 1/30]
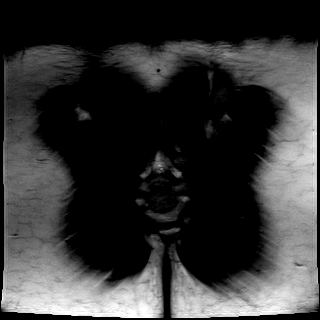

[Series 8: ax dwi_tracew_dfc · axial · 5.0mm · 1.19mm/px · z∈[-140,+70]mm · 3 of 108 slices shown]
[im 1/108]
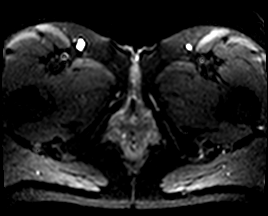
[im 54/108]
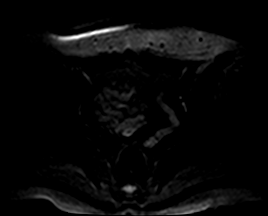
[im 108/108]
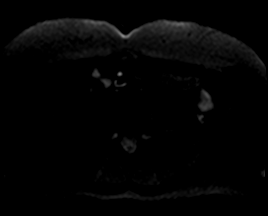

[Series 9: ax dwi_adc_dfc · axial · 5.0mm · 1.19mm/px · 1 of 36 slices shown]
[im 1/36]
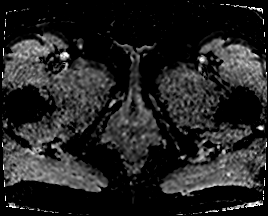

[Series 10: T1 dynamic · axial · non-contrast · 3.0mm · 1.25mm/px · z∈[-149,+88]mm · 2 of 80 slices shown]
[im 1/80]
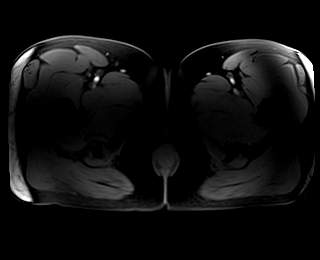
[im 80/80]
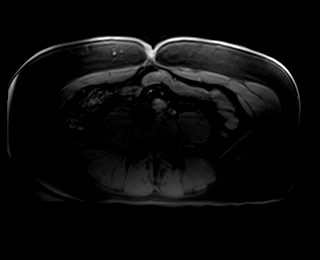

[Series 11: T1 dynamic post-contrast · axial · 3.0mm · 1.25mm/px · z∈[-149,+88]mm · 3 of 80 slices shown (1 of 8)]
[im 1/80]
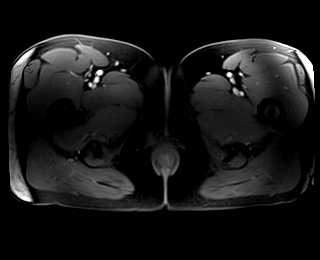
[im 40/80]
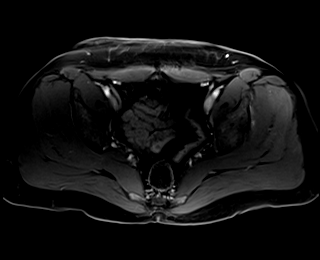
[im 80/80]
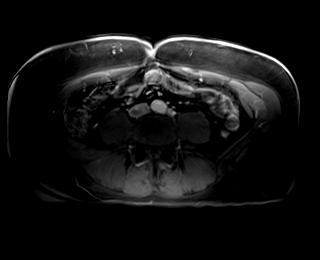

[Series 12: T1 dynamic post-contrast · axial · 3.0mm · 1.25mm/px · z∈[-149,+88]mm · 3 of 80 slices shown (2 of 8)]
[im 1/80]
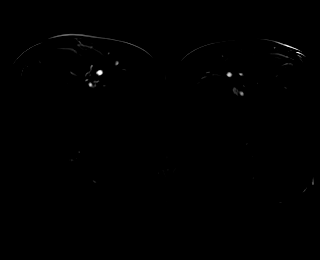
[im 40/80]
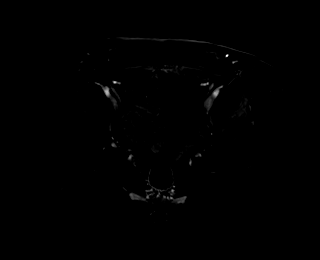
[im 80/80]
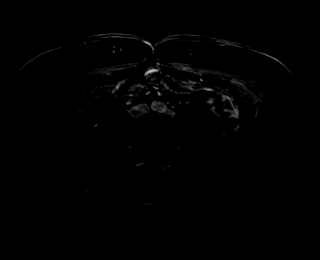

[Series 13: T1 dynamic post-contrast · axial · 3.0mm · 1.25mm/px · z∈[-149,+88]mm · 3 of 80 slices shown (3 of 8)]
[im 1/80]
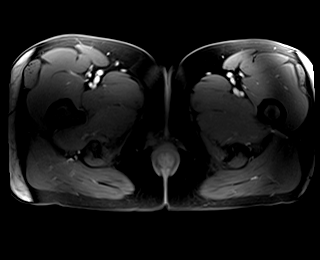
[im 40/80]
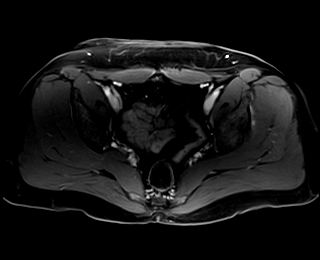
[im 80/80]
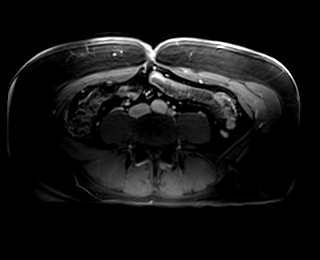

[Series 14: T1 dynamic post-contrast · axial · 3.0mm · 1.25mm/px · z∈[-149,+88]mm · 3 of 80 slices shown (4 of 8)]
[im 1/80]
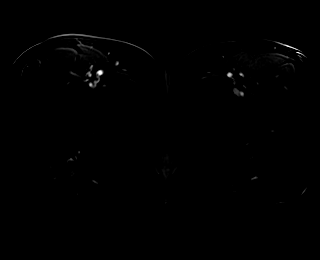
[im 40/80]
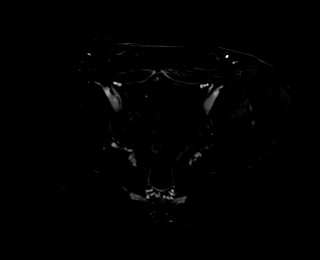
[im 80/80]
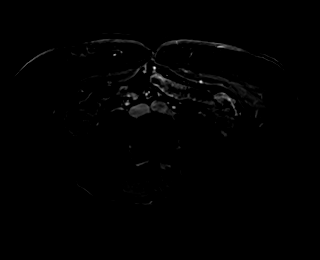

[Series 15: T1 dynamic post-contrast · axial · 3.0mm · 1.25mm/px · z∈[-149,+88]mm · 3 of 80 slices shown (5 of 8)]
[im 1/80]
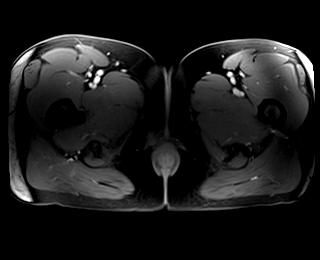
[im 40/80]
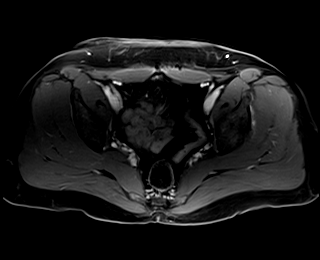
[im 80/80]
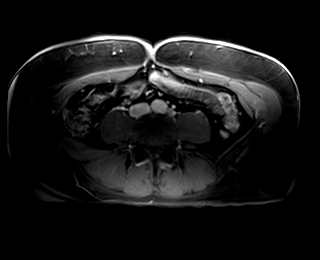

[Series 16: T1 dynamic post-contrast · axial · 3.0mm · 1.25mm/px · z∈[-149,+88]mm · 3 of 80 slices shown (6 of 8)]
[im 1/80]
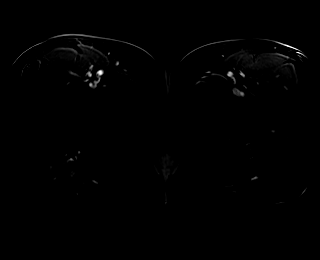
[im 40/80]
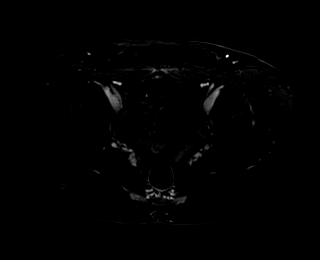
[im 80/80]
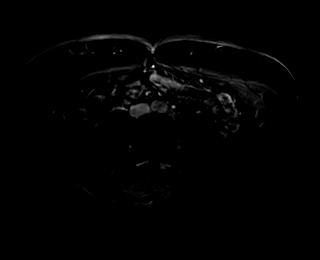

[Series 17: T1 fat-sat post-contrast · axial · 1.2mm · 0.75mm/px · z∈[-141,+50]mm · 5 of 160 slices shown]
[im 1/160]
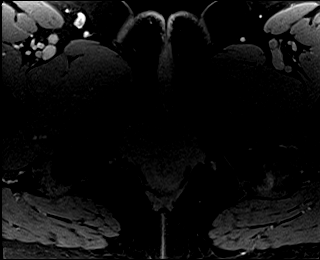
[im 40/160]
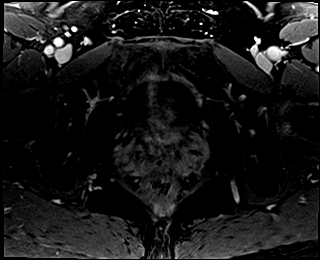
[im 80/160]
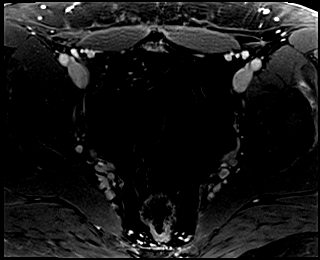
[im 120/160]
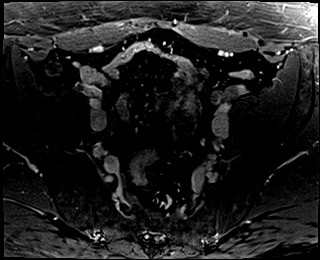
[im 160/160]
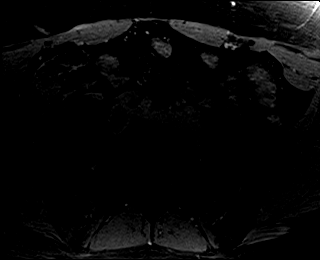

[Series 18: T1 dynamic post-contrast · axial · 3.0mm · 1.25mm/px · z∈[-149,+88]mm · 3 of 80 slices shown (7 of 8)]
[im 1/80]
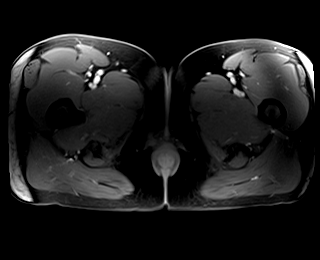
[im 40/80]
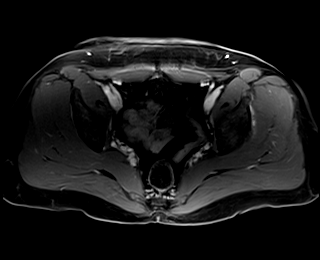
[im 80/80]
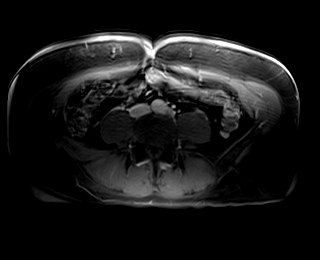

[Series 19: T1 dynamic post-contrast · axial · 3.0mm · 1.25mm/px · z∈[-149,+88]mm · 3 of 80 slices shown (8 of 8)]
[im 1/80]
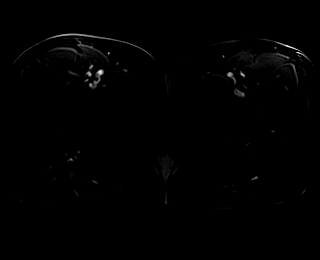
[im 40/80]
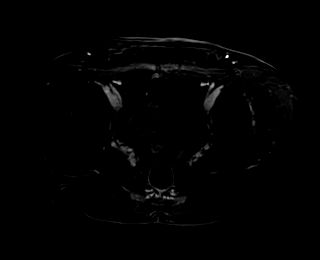
[im 80/80]
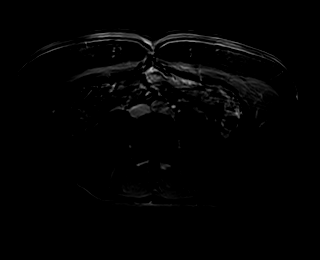

[Series 20: T1 fat-sat · sagittal · 1.2mm · 0.88mm/px · 1 of 160 slices shown]
[im 1/160]
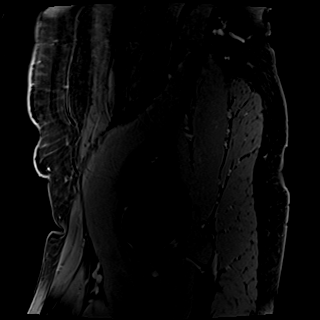

[43 of 48 positions shown; findings below may reference images not displayed]

FINDINGS: Urinary Tract: The urinary bladder appears normal for the degree of
distention. No evidence for urethral diverticulum.

Bowel:  Unremarkable visualized pelvic bowel loops.

Vascular/Lymphatic: No pathologically enlarged lymph nodes. No
significant vascular abnormality seen.

Reproductive: Uterus is surgically absent in what appears to be a
supracervical hysterectomy. There is a 1.9 x 1.7 x 1.8 cm complex
cystic mass in the cervix. Cervical canal is not clearly delineated
by MR imaging although this lesion does have a central location
within the apparent cervical stroma. After IV contrast
administration enhancing internal septa or thin mural components
from adjacent cystic lesions are evident.

A second small nodular midline component is seen along the anterior
cervix with some indentation of the posterior bladder wall this is
immediately anterior to the above described multicystic lesion and
measures approximately 1.3 x 1.1 x 0.7 cm (see axial T2 image 20 of
series 8).

Right ovary measures 2.0 x 1.2 x 3.3 cm, unremarkable.

Left ovary measures 2.8 x 1.3 x 2.3 cm, unremarkable.

Other:  No intraperitoneal free fluid.

Musculoskeletal: No focal suspicious marrow enhancement within the
visualized bony anatomy.
IMPRESSION: 1. Patient is presumed to be status post supracervical hysterectomy.
Close correlation with surgical records recommended. 1.9 x 1.7 x
cm complex cystic mass is identified in the central cervical stroma.
Cervical canal not well demonstrated in this lesion may be related
to the canal or in the central stroma. The lesion may represent a
tight cluster of multiple cysts or a single multicystic lesion.
Large nabothian cyst would be a consideration. Cystic cervicitis
would also be a consideration. Adenoma malignum/neoplasm cannot be
excluded by imaging.
2. A second small nodular component along the anterior wall of the
cervix is immediately anterior to the above described lesion and
measures up to 1.3 cm in maximum dimension. This lesion does appear
to generates some mass-effect on the posterior wall of the urinary
bladder. While imaging features are nonspecific, this does not
appear overtly aggressive and may simply represent scar.
3. Unremarkable MR appearance of the ovaries.

## 2021-09-17 MED ORDER — GADOBENATE DIMEGLUMINE 529 MG/ML IV SOLN
20.0000 mL | Freq: Once | INTRAVENOUS | Status: AC | PRN
  Administered 2021-09-17: 20 mL via INTRAVENOUS

## 2021-09-17 NOTE — Progress Notes (Signed)
Discuss with patient in office at visit 10/05/2021. Will need referral to GYN.

## 2021-09-21 ENCOUNTER — Encounter: Payer: Self-pay | Admitting: Nurse Practitioner

## 2021-09-21 DIAGNOSIS — Z01419 Encounter for gynecological examination (general) (routine) without abnormal findings: Secondary | ICD-10-CM

## 2021-10-01 ENCOUNTER — Ambulatory Visit
Admission: RE | Admit: 2021-10-01 | Discharge: 2021-10-01 | Disposition: A | Discharge status: Home / Self Care | Payer: Managed Care, Other (non HMO) | Source: Ambulatory Visit | Attending: Nurse Practitioner | Admitting: Nurse Practitioner

## 2021-10-01 DIAGNOSIS — N2889 Other specified disorders of kidney and ureter: Secondary | ICD-10-CM

## 2021-10-01 IMAGING — MR MR ABDOMEN WO/W CM
13 series · 46 of 48 positions shown · IV contrast (EOVIST)
Comparison: Abdominal ultrasound [DATE]

CLINICAL DATA: Indeterminate left renal mass on ultrasound.
Previous hysterectomy.

EXAM:
MRI ABDOMEN WITHOUT AND WITH CONTRAST
TECHNIQUE: Multiplanar multisequence MR imaging of the abdomen was performed
both before and after the administration of intravenous contrast.
CONTRAST:  20mL MULTIHANCE GADOBENATE DIMEGLUMINE 529 MG/ML IV SOLN

[Series 3: T2 · coronal · 5.0mm · 1.56mm/px · 3 of 36 slices shown (1 of 3)]
[im 1/36]
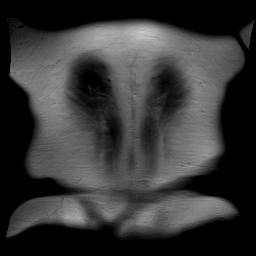
[im 18/36]
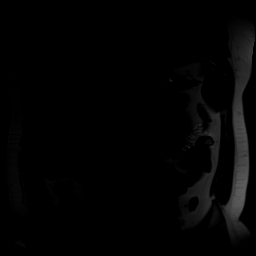
[im 36/36]
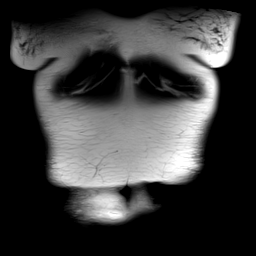

[Series 4: ax in out · axial · 3.5mm · 0.78mm/px · z∈[-102,+140]mm · 7 of 122 slices shown]
[im 1/122]
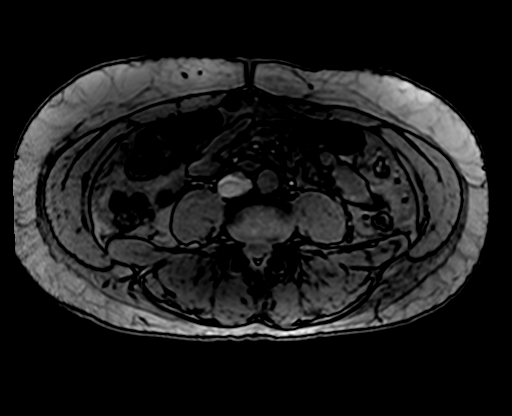
[im 21/122]
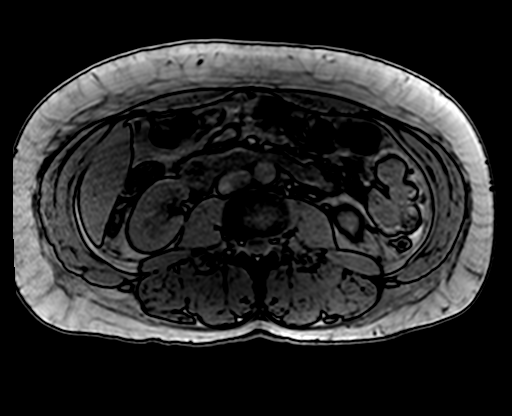
[im 41/122]
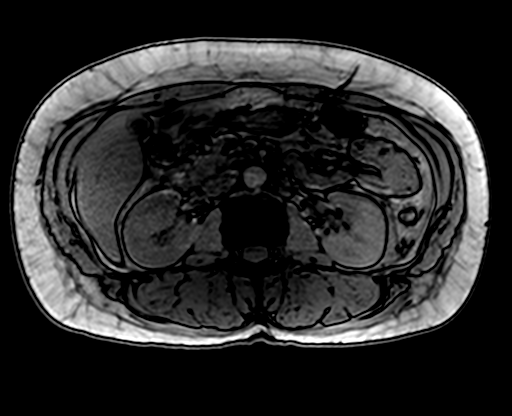
[im 61/122]
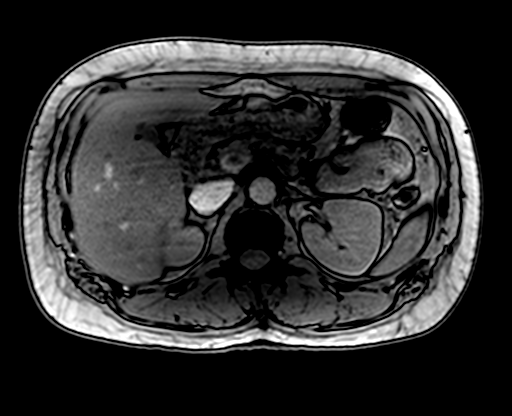
[im 81/122]
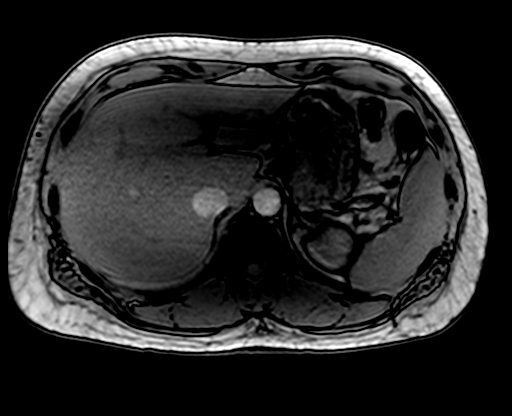
[im 101/122]
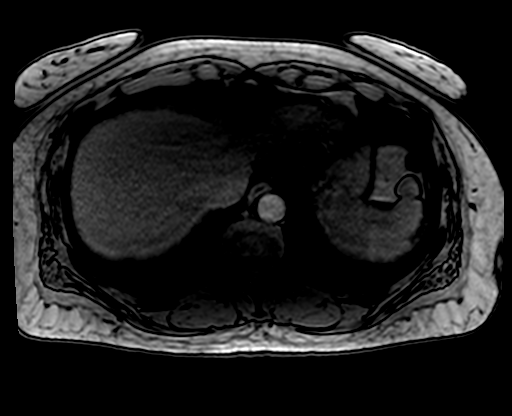
[im 122/122]
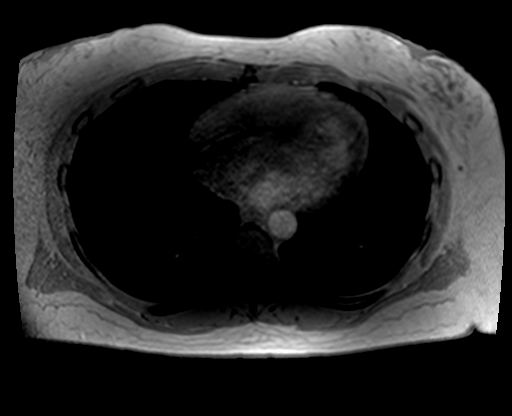

[Series 5: T2 · axial · 5.0mm · 1.48mm/px · z∈[-109,+143]mm · 2 of 43 slices shown (2 of 3)]
[im 1/43]
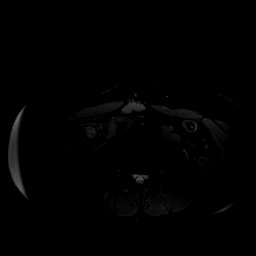
[im 43/43]
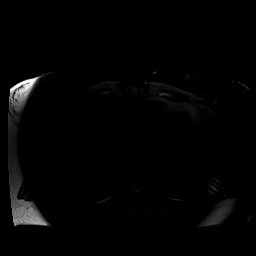

[Series 6: DWI · axial · 5.0mm · 1.42mm/px · z∈[-110,+142]mm · 6 of 129 slices shown (1 of 2)]
[im 1/129]
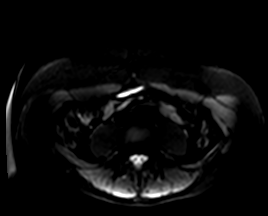
[im 26/129]
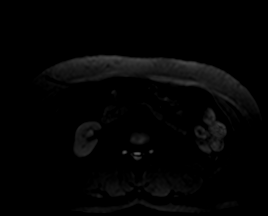
[im 52/129]
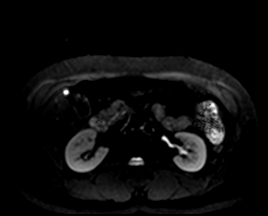
[im 77/129]
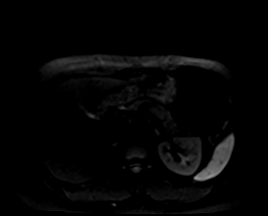
[im 103/129]
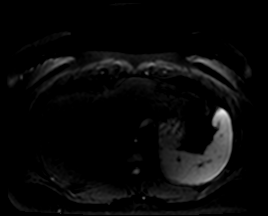
[im 129/129]
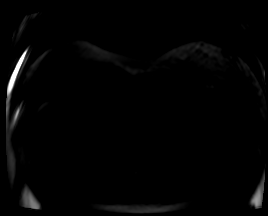

[Series 7: DWI · axial · 5.0mm · 1.42mm/px · z∈[-110,+142]mm · 2 of 43 slices shown (2 of 2)]
[im 1/43]
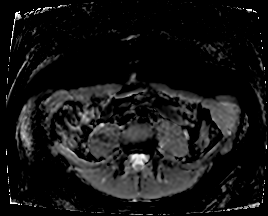
[im 43/43]
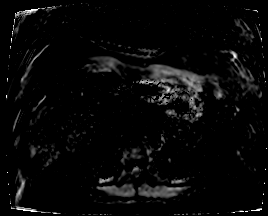

[Series 8: T2 · axial · 6.0mm · 1.25mm/px · z∈[-114,+146]mm · 2 of 37 slices shown (3 of 3)]
[im 1/37]
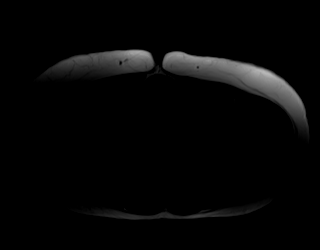
[im 37/37]
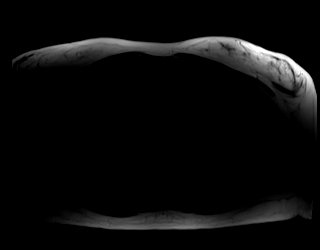

[Series 9: bSSFP · axial · 5.0mm · 1.25mm/px · z∈[-110,+142]mm · 2 of 43 slices shown]
[im 1/43]
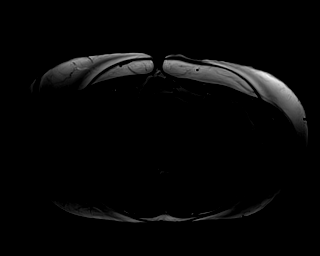
[im 43/43]
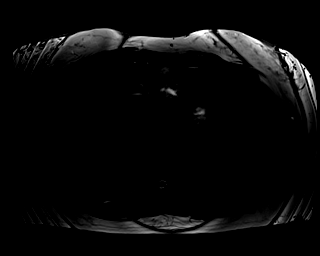

[Series 10: T1 dynamic · axial · non-contrast · 3.0mm · 1.25mm/px · z∈[-114,+147]mm · 4 of 88 slices shown]
[im 1/88]
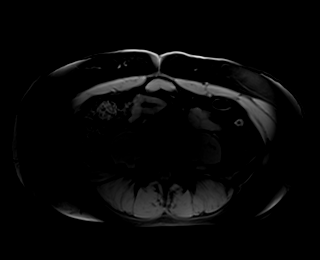
[im 30/88]
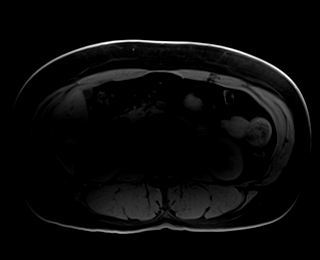
[im 59/88]
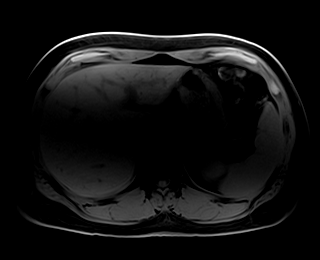
[im 88/88]
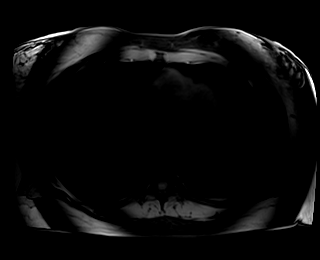

[Series 18: T1 dynamic post-contrast · axial · 3.0mm · 1.25mm/px · z∈[-114,+147]mm · 4 of 88 slices shown (1 of 5)]
[im 1/88]
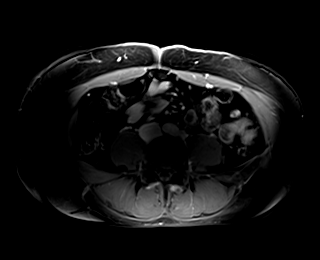
[im 30/88]
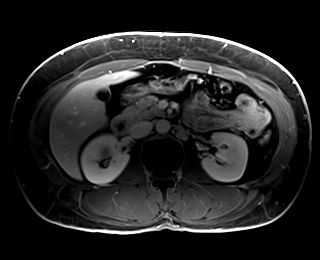
[im 59/88]
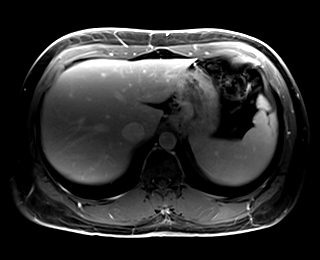
[im 88/88]
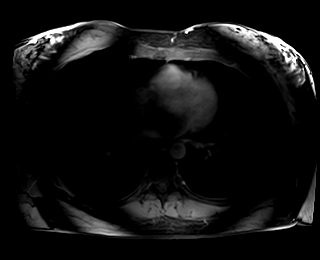

[Series 19: T1 dynamic post-contrast · axial · 3.0mm · 1.25mm/px · z∈[-114,+147]mm · 4 of 88 slices shown (2 of 5)]
[im 1/88]
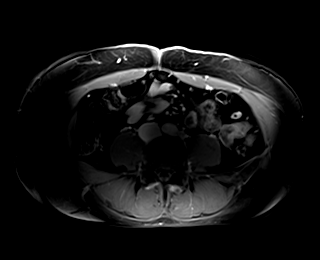
[im 30/88]
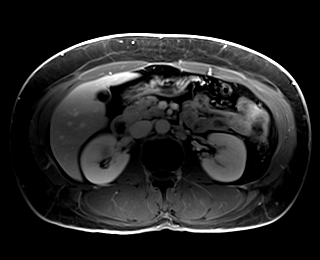
[im 59/88]
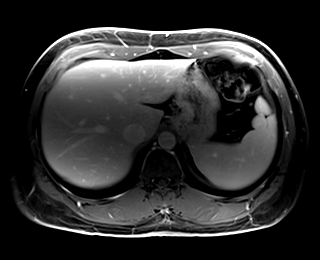
[im 88/88]
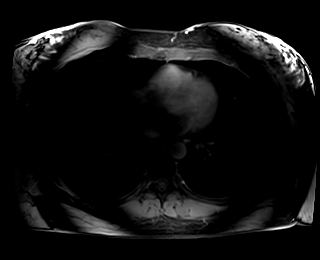

[Series 20: T1 dynamic post-contrast · axial · 3.0mm · 1.25mm/px · z∈[-114,+147]mm · 4 of 88 slices shown (3 of 5)]
[im 1/88]
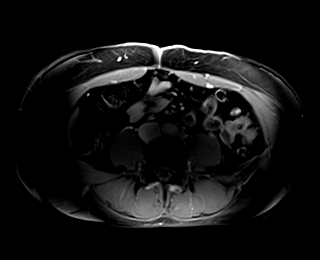
[im 30/88]
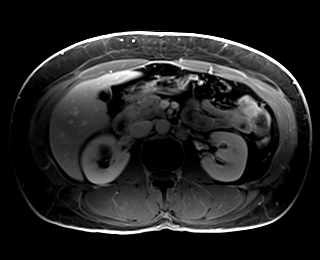
[im 59/88]
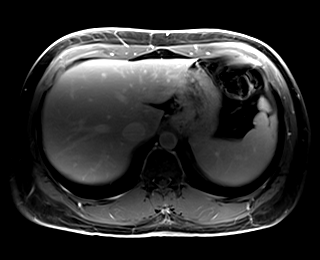
[im 88/88]
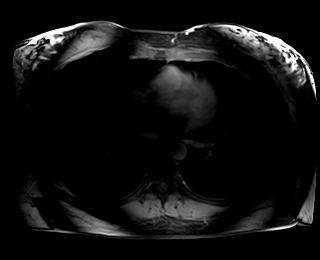

[Series 21: T1 dynamic post-contrast · coronal · 3.0mm · 1.25mm/px · 4 of 72 slices shown (4 of 5)]
[im 1/72]
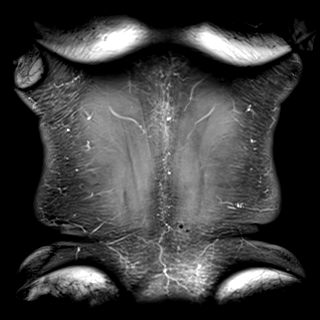
[im 24/72]
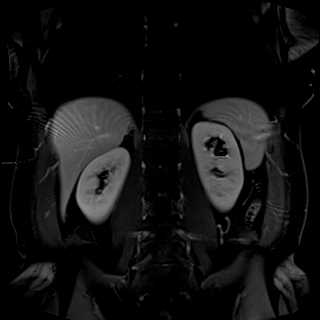
[im 48/72]
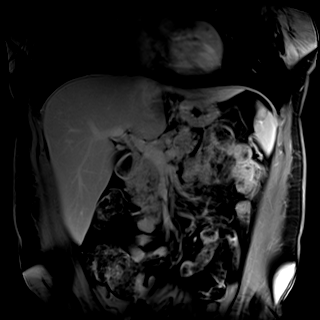
[im 72/72]
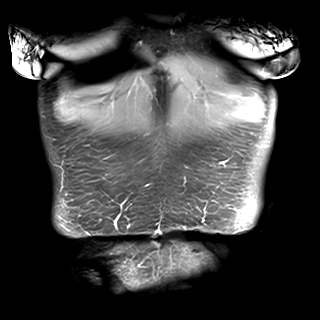

[Series 22: T1 dynamic post-contrast · axial · 3.0mm · 1.25mm/px · z∈[-114,-27]mm · 2 of 88 slices shown (5 of 5)]
[im 1/88]
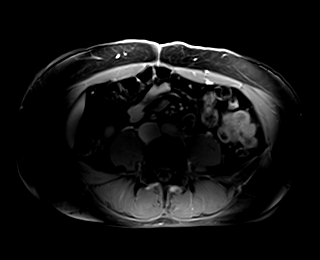
[im 30/88]
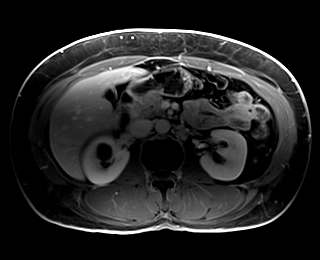

[46 of 48 positions shown; findings below may reference images not displayed]

FINDINGS: Lower chest:  The visualized lower chest appears unremarkable.

Hepatobiliary: The liver appears normal, without focal abnormality
or abnormal enhancement. No evidence of gallstones, gallbladder wall
thickening or biliary dilatation.

Pancreas: Unremarkable. No pancreatic ductal dilatation or
surrounding inflammatory changes.

Spleen: Normal in size without focal abnormality.

Adrenals/Urinary Tract: Both adrenal glands appear normal. There is
a simple cyst in the upper pole of the right kidney, measuring 2.5 x
2.3 cm. The left kidney appears normal on all pulse sequences,
demonstrating no restricted diffusion or abnormal enhancement. The
suggested left renal mass on prior ultrasound appears to be
secondary to pseudotumor. No hydronephrosis or definite collecting
system duplication.

Stomach/Bowel: The stomach appears unremarkable for its degree of
distension. No evidence of bowel wall thickening, distention or
surrounding inflammatory change.

Vascular/Lymphatic: There are no enlarged abdominal lymph nodes. No
significant vascular findings.

Other: No evidence of abdominal wall hernia or ascites.

Musculoskeletal: No acute or significant osseous findings. Mild
lumbar facet arthropathy.
IMPRESSION: 1. The left kidney appears normal, without evidence of mass lesion.
2. Simple cyst in the upper pole the right kidney.
3. Otherwise unremarkable examination.

## 2021-10-01 MED ORDER — GADOBENATE DIMEGLUMINE 529 MG/ML IV SOLN
15.0000 mL | Freq: Once | INTRAVENOUS | Status: AC | PRN
  Administered 2021-10-01: 20 mL via INTRAVENOUS

## 2021-10-04 NOTE — Progress Notes (Signed)
Review with patient at visit 10/05/2021

## 2021-10-05 ENCOUNTER — Encounter: Payer: Self-pay | Admitting: Nurse Practitioner

## 2021-10-05 ENCOUNTER — Ambulatory Visit (INDEPENDENT_AMBULATORY_CARE_PROVIDER_SITE_OTHER): Payer: Managed Care, Other (non HMO) | Admitting: Nurse Practitioner

## 2021-10-05 ENCOUNTER — Other Ambulatory Visit: Payer: Self-pay

## 2021-10-05 VITALS — BP 130/80 | HR 79 | Temp 98.0°F | Ht 68.9 in | Wt 229.0 lb

## 2021-10-05 DIAGNOSIS — D487 Neoplasm of uncertain behavior of other specified sites: Secondary | ICD-10-CM

## 2021-10-05 DIAGNOSIS — J4520 Mild intermittent asthma, uncomplicated: Secondary | ICD-10-CM

## 2021-10-05 DIAGNOSIS — F17209 Nicotine dependence, unspecified, with unspecified nicotine-induced disorders: Secondary | ICD-10-CM | POA: Insufficient documentation

## 2021-10-05 DIAGNOSIS — N2889 Other specified disorders of kidney and ureter: Secondary | ICD-10-CM | POA: Insufficient documentation

## 2021-10-05 DIAGNOSIS — I7300 Raynaud's syndrome without gangrene: Secondary | ICD-10-CM | POA: Insufficient documentation

## 2021-10-05 DIAGNOSIS — Z6833 Body mass index (BMI) 33.0-33.9, adult: Secondary | ICD-10-CM

## 2021-10-05 NOTE — Progress Notes (Signed)
Referral to nephrology made for further evaluation and treatment.

## 2021-10-05 NOTE — Progress Notes (Signed)
Established patient visit   Patient: Kimberly Armstrong   DOB: 07/05/1981   41 y.o. Female  MRN: [id number] Visit Date: 10/05/2021   Chief Complaint  Patient presents with   Follow-up   Abdominal Pain   Subjective    Abdominal Pain Associated symptoms include dysuria and frequency. Pertinent negatives include no arthralgias, constipation, diarrhea, fever, headaches, myalgias, nausea or vomiting.   The patient presents for follow up visit. She continues to have discomfort and pressure in the left pelvis and abdominal areas. She did have MRI of both the abdomen and pelvis. The MRI of the pelvis was significant for 1. Patient is presumed to be status post supracervical hysterectomy. Close correlation with surgical records recommended. 1.9 x 1.7 x 1.8 cm complex cystic mass is identified in the central cervical stroma. Cervical canal not well demonstrated in this lesion may be related to the canal or in the central stroma. The lesion may represent a tight cluster of multiple cysts or a single multicystic lesion. Large nabothian cyst would be a consideration. Cystic cervicitis would also be a consideration. Adenoma malignum/neoplasm cannot be excluded by imaging. 2. A second small nodular component along the anterior wall of  cervix is immediately anterior to the above described lesion and measures up to 1.3 cm in maximum dimension. This lesion does appear to generates some mass-effect on the posterior wall of the urinary bladder. While imaging features are nonspecific, this does not appear overtly aggressive and may simply represent scar. 3. Unremarkable MR appearance of the ovaries A referral to GYN provider has already been made. The patient states that she has not heard from provider to schedule appointment.  MRI of the abdomen was significant for 1.The left kidney appears normal, without evidence of mass lesion. 2. Simple cyst in the upper pole the right kidney. 3. Otherwise unremarkable  examination.  She does report cold sensation of the hands and feet. This has been going on for about two months. She is a smoker. She has had increased stress levels due to positive MRI results.    Medications: Outpatient Medications Prior to Visit  Medication Sig   albuterol (VENTOLIN HFA) 108 (90 Base) MCG/ACT inhaler Inhale 2 puffs into the lungs every 6 (six) hours as needed for wheezing or shortness of breath.   baclofen (LIORESAL) 10 MG tablet Take 1 tablet (10 mg total) by mouth 3 (three) times daily.   budesonide-formoterol (SYMBICORT) 160-4.5 MCG/ACT inhaler Inhale 2 puffs into the lungs 2 (two) times daily.   cetirizine (ZYRTEC) 10 MG tablet Take 1 tablet (10 mg total) by mouth daily.   diclofenac (VOLTAREN) 75 MG EC tablet Take 1 tablet (75 mg total) by mouth 2 (two) times daily.   fluticasone (FLONASE) 50 MCG/ACT nasal spray SPRAY 2 SPRAYS INTO EACH NOSTRIL EVERY DAY   montelukast (SINGULAIR) 10 MG tablet Take 1 tablet (10 mg total) by mouth at bedtime.   valACYclovir (VALTREX) 500 MG tablet Take 2 tablets at onset of cold sore, then 1 tablet BID until resolved; repeat as needed   No facility-administered medications prior to visit.    Review of Systems  Constitutional:  Positive for fatigue. Negative for activity change, appetite change, chills and fever.  HENT:  Negative for congestion, postnasal drip, rhinorrhea, sinus pressure, sinus pain, sneezing and sore throat.   Eyes: Negative.   Respiratory:  Negative for cough, chest tightness, shortness of breath and wheezing.   Cardiovascular:  Negative for chest pain and palpitations.  Cold sensation to hands and feet.   Gastrointestinal:  Positive for abdominal pain. Negative for constipation, diarrhea, nausea and vomiting.  Endocrine: Negative for cold intolerance, heat intolerance, polydipsia and polyuria.  Genitourinary:  Positive for dyspareunia, dysuria, frequency and pelvic pain. Negative for flank pain and urgency.   Musculoskeletal:  Negative for arthralgias, back pain and myalgias.  Skin:  Negative for rash.  Allergic/Immunologic: Negative for environmental allergies.  Neurological:  Negative for dizziness, weakness and headaches.  Hematological:  Negative for adenopathy.  Psychiatric/Behavioral:  The patient is nervous/anxious.       Objective     Today's Vitals   10/05/21 0809  BP: 130/80  Pulse: 79  Temp: 98 F (36.7 C)  SpO2: 99%  Weight: 229 lb (103.9 kg)  Height: 5' 8.9" (1.75 m)   Body mass index is 33.92 kg/m.   BP Readings from Last 3 Encounters:  10/05/21 130/80  09/07/21 134/83  04/08/21 135/88    Wt Readings from Last 3 Encounters:  10/05/21 229 lb (103.9 kg)  09/07/21 227 lb 14.4 oz (103.4 kg)  04/08/21 225 lb 4.8 oz (102.2 kg)    Physical Exam Vitals and nursing note reviewed.  Constitutional:      Appearance: Normal appearance. She is well-developed.  HENT:     Head: Normocephalic and atraumatic.  Eyes:     Extraocular Movements: Extraocular movements intact.     Pupils: Pupils are equal, round, and reactive to light.  Cardiovascular:     Rate and Rhythm: Normal rate and regular rhythm.     Pulses: Normal pulses.     Heart sounds: Normal heart sounds.  Pulmonary:     Effort: Pulmonary effort is normal.     Breath sounds: Normal breath sounds.  Abdominal:     General: Bowel sounds are normal. There is no distension.     Palpations: Abdomen is soft. There is no mass.     Tenderness: There is abdominal tenderness. There is no guarding or rebound.     Hernia: No hernia is present.  Musculoskeletal:        General: Normal range of motion.     Cervical back: Normal range of motion and neck supple.  Lymphadenopathy:     Cervical: No cervical adenopathy.  Skin:    General: Skin is warm and dry.     Capillary Refill: Capillary refill takes less than 2 seconds.  Neurological:     General: No focal deficit present.     Mental Status: She is alert and  oriented to person, place, and time.  Psychiatric:        Mood and Affect: Mood normal.        Behavior: Behavior normal.        Thought Content: Thought content normal.        Judgment: Judgment normal.      Assessment & Plan    1. Renal mass, left MRI of the abdomen showing complex cystic mass in right kidney along with a second, smaller cystic mass in lower aspect of the kidney. Refer to nephrology for further evaluation and treatment.  - Ambulatory referral to Nephrology  2. Neoplasm of uncertain behavior of pelvis Referral to GYN for further evaluation has already been made. Will check status of this referral.   3. Body mass index (BMI) of 33.0-33.9 in adult Discussed lowering calorie intake to 1500 calories per day and incorporating exercise into daily routine to help lose weight.  4. Mild intermittent asthma without  complication Stable. Continue medications as prescribed. Refills to be provided as needed   5. Tobacco use disorder, continuous Discussed smoking cessation with the patient. Will improve asthma and possible raynaud's phenomenon.   6. Raynaud's phenomenon without gangrene Patient reporting cold sensation of the hands and feet. Discussed symptom management such as using heating pads and hand/foot warmers to help improve cold sensation of the hands and feet. Recommend she cut back and quit smoking as this was likely contributing to the symptoms. She voiced understanding.     Problem List Items Addressed This Visit       Respiratory   Mild intermittent asthma without complication     Other   Body mass index (BMI) of 33.0-33.9 in adult   Renal mass, left - Primary   Relevant Orders   Ambulatory referral to Nephrology   Neoplasm of uncertain behavior of pelvis   Tobacco use disorder, continuous   Raynaud's phenomenon without gangrene     Return in about 6 months (around 04/04/2022) for asthma .         Ronnell Freshwater, NP  [facility] at  [facility] [phone] (phone) [phone] (fax)  [place]

## 2021-11-13 ENCOUNTER — Telehealth: Payer: Managed Care, Other (non HMO) | Admitting: Family Medicine

## 2021-11-13 DIAGNOSIS — R102 Pelvic and perineal pain: Secondary | ICD-10-CM | POA: Diagnosis not present

## 2021-11-13 MED ORDER — IBUPROFEN 600 MG PO TABS: 600.0000 mg | ORAL_TABLET | Freq: Three times a day (TID) | ORAL | 0 refills | Status: DC | PRN

## 2021-11-13 NOTE — Patient Instructions (Signed)
?  [name], thank you for joining [name], FNP for today's virtual visit.  While this provider is not your primary care provider (PCP), if your PCP is located in our provider database this encounter information will be shared with them immediately following your visit. ? ?Consent: ?(Patient) Kimberly Armstrong provided verbal consent for this virtual visit at the beginning of the encounter. ? ?Current Medications: ? ?Current Outpatient Medications:  ?  ibuprofen (ADVIL) 600 MG tablet, Take 1 tablet (600 mg total) by mouth every 8 (eight) hours as needed., Disp: 30 tablet, Rfl: 0 ?  albuterol (VENTOLIN HFA) 108 (90 Base) MCG/ACT inhaler, Inhale 2 puffs into the lungs every 6 (six) hours as needed for wheezing or shortness of breath., Disp: 1 each, Rfl: 2 ?  baclofen (LIORESAL) 10 MG tablet, Take 1 tablet (10 mg total) by mouth 3 (three) times daily., Disp: 90 each, Rfl: 1 ?  budesonide-formoterol (SYMBICORT) 160-4.5 MCG/ACT inhaler, Inhale 2 puffs into the lungs 2 (two) times daily., Disp: 3 each, Rfl: 1 ?  cetirizine (ZYRTEC) 10 MG tablet, Take 1 tablet (10 mg total) by mouth daily., Disp: 90 tablet, Rfl: 1 ?  diclofenac (VOLTAREN) 75 MG EC tablet, Take 1 tablet (75 mg total) by mouth 2 (two) times daily., Disp: 90 tablet, Rfl: 1 ?  fluticasone (FLONASE) 50 MCG/ACT nasal spray, SPRAY 2 SPRAYS INTO EACH NOSTRIL EVERY DAY, Disp: 16 mL, Rfl: 3 ?  montelukast (SINGULAIR) 10 MG tablet, Take 1 tablet (10 mg total) by mouth at bedtime., Disp: 90 tablet, Rfl: 3 ?  valACYclovir (VALTREX) 500 MG tablet, Take 2 tablets at onset of cold sore, then 1 tablet BID until resolved; repeat as needed, Disp: 90 tablet, Rfl: 1  ? ?Medications ordered in this encounter:  ?Meds ordered this encounter  ?Medications  ? ibuprofen (ADVIL) 600 MG tablet  ?  Sig: Take 1 tablet (600 mg total) by mouth every 8 (eight) hours as needed.  ?  Dispense:  30 tablet  ?  Refill:  0  ?  Order Specific Question:   Supervising  Provider  ?  Answer:   Noemi Chapel [[id number]]  ?  ? ?*If you need refills on other medications prior to your next appointment, please contact your pharmacy* ? ?Follow-Up: ?Call back or seek an in-person evaluation if the symptoms worsen or if the condition fails to improve as anticipated. ? ?Other Instructions ? ?Keep scheduled apptmt with GYN Tuesday.  ? ? ?If you have been instructed to have an in-person evaluation today at a local Urgent Care facility, please use the link below. It will take you to a list of all of our available [facility], including address, phone number and hours of operation. Please do not delay care.  ?[facility] ? ?If you or a family member do not have a primary care provider, use the link below to schedule a visit and establish care. When you choose a [facility] primary care physician or advanced practice provider, you gain a long-term partner in health. ?Find a Primary Care Provider ? ?Learn more about [facility] in-office and virtual care options: ?[facility]  ? ?

## 2021-11-13 NOTE — Progress Notes (Signed)
?Virtual Visit Consent  ? ?Kimberly Armstrong, you are scheduled for a virtual visit with a [facility] provider today.   ?  ?Just as with appointments in the office, your consent must be obtained to participate.  Your consent will be active for this visit and any virtual visit you may have with one of our providers in the next 365 days.   ?  ?If you have a MyChart account, a copy of this consent can be sent to you electronically.  All virtual visits are billed to your insurance company just like a traditional visit in the office.   ? ?As this is a virtual visit, video technology does not allow for your provider to perform a traditional examination.  This may limit your provider's ability to fully assess your condition.  If your provider identifies any concerns that need to be evaluated in person or the need to arrange testing (such as labs, EKG, etc.), we will make arrangements to do so.   ?  ?Although advances in technology are sophisticated, we cannot ensure that it will always work on either your end or our end.  If the connection with a video visit is poor, the visit may have to be switched to a telephone visit.  With either a video or telephone visit, we are not always able to ensure that we have a secure connection.    ? ?I need to obtain your verbal consent now.   Are you willing to proceed with your visit today?  ?  ?Kimberly Armstrong has provided verbal consent on 11/13/2021 for a virtual visit (video or telephone). ?  ?Kimberly Nims, FNP  ? ?Date: 11/13/2021 12:38 PM ? ? ?Virtual Visit via Video Note  ? ?IDellia Armstrong, connected with  Kimberly Armstrong  ([id number], 43/27/1982) on 11/13/21 at 12:00 PM EDT by a video-enabled telemedicine application and verified that I am speaking with the correct person using two identifiers. ? ?Location: ?Patient: Virtual Visit Location Patient: Mobile ?Provider: Virtual Visit Location Provider: Home Office ?  ?I discussed the limitations of  evaluation and management by telemedicine and the availability of in person appointments. The patient expressed understanding and agreed to proceed.   ? ?History of Present Illness: ?Kimberly Armstrong is a 41 y.o. who identifies as a female who was assigned female at birth, and is being seen today for pelvic pain. She has had a MRI and [place] and GYN apptmt is pending Tuesday. She does not feel the diclofenac is helping and she is taking too many BC powders. She would like ibuprofen. She has been using heat with salon pas and a rice bag. She has not fever and is at work in no acute distress. Denies nausea, vomiting and diarrhea. Reports normal bowel movements.  ? ?HPI: HPI  ?Problems:  ?Patient Active Problem List  ? Diagnosis Date Noted  ? Renal mass, left 10/05/2021  ? 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 Tobacco use disorder, continuous 10/05/2021  ? Raynaud's phenomenon without gangrene 10/05/2021  ? Generalized abdominal tenderness without rebound tenderness 09/07/2021  ? Seasonal allergies 09/07/2021  ? Acute left-sided low back pain without sciatica 09/07/2021  ? Cold sore 09/07/2021  ? Encounter for well woman exam with routine gynecological exam 03/15/2021  ? Neck muscle spasm 03/15/2021  ? Mild intermittent asthma without complication 00/17/4944  ? Body mass index (BMI) of 33.0-33.9 in adult 03/15/2021  ? Screening for STD (sexually transmitted disease) 03/15/2021  ? Need  for Tdap vaccination 03/15/2021  ? Encounter to establish care 12/03/2020  ? 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 Moderate cigarette smoker 12/03/2020  ? Encounter for screening mammogram for breast cancer 12/03/2020  ? Asthma [birth year] 1978  ?  ?Allergies: No Known Allergies ?Medications:  ?Current Outpatient Medications:  ?  ibuprofen (ADVIL) 600 MG tablet, Take 1 tablet (600 mg total) by mouth every 8 (eight) hours as needed., Disp: 30 tablet, Rfl: 0 ?  albuterol (VENTOLIN HFA) 108 (90  Base) MCG/ACT inhaler, Inhale 2 puffs into the lungs every 6 (six) hours as needed for wheezing or shortness of breath., Disp: 1 each, Rfl: 2 ?  baclofen (LIORESAL) 10 MG tablet, Take 1 tablet (10 mg total) by mouth 3 (three) times daily., Disp: 90 each, Rfl: 1 ?  budesonide-formoterol (SYMBICORT) 160-4.5 MCG/ACT inhaler, Inhale 2 puffs into the lungs 2 (two) times daily., Disp: 3 each, Rfl: 1 ?  cetirizine (ZYRTEC) 10 MG tablet, Take 1 tablet (10 mg total) by mouth daily., Disp: 90 tablet, Rfl: 1 ?  diclofenac (VOLTAREN) 75 MG EC tablet, Take 1 tablet (75 mg total) by mouth 2 (two) times daily., Disp: 90 tablet, Rfl: 1 ?  fluticasone (FLONASE) 50 MCG/ACT nasal spray, SPRAY 2 SPRAYS INTO EACH NOSTRIL EVERY DAY, Disp: 16 mL, Rfl: 3 ?  montelukast (SINGULAIR) 10 MG tablet, Take 1 tablet (10 mg total) by mouth at bedtime., Disp: 90 tablet, Rfl: 3 ?  valACYclovir (VALTREX) 500 MG tablet, Take 2 tablets at onset of cold sore, then 1 tablet BID until resolved; repeat as needed, Disp: 90 tablet, Rfl: 1 ? ?Observations/Objective: ?Patient is well-developed, well-nourished in no acute distress.  ?Resting comfortably. ?Head is normocephalic, atraumatic.  ?No labored breathing.  ?Speech is clear and coherent with logical content.  ?Patient is alert and oriented at baseline.  ?CLINICAL DATA:  Nodules identified in the vaginal cuff on recent ?ultrasound. ?  ?EXAM: ?MRI PELVIS WITHOUT AND WITH CONTRAST ?  ?TECHNIQUE: ?Multiplanar multisequence MR imaging of the pelvis was performed ?both before and after administration of intravenous contrast. ?  ?CONTRAST:  90m MULTIHANCE GADOBENATE DIMEGLUMINE 529 MG/ML IV SOLN ?  ?COMPARISON:  Ultrasound exam 09/10/2021 ?  ?FINDINGS: ?Urinary Tract: The urinary bladder appears normal for the degree of ?distention. No evidence for urethral diverticulum. ?  ?Bowel:  Unremarkable visualized pelvic bowel loops. ?  ?Vascular/Lymphatic: No pathologically enlarged lymph nodes. No ?significant  vascular abnormality seen. ?  ?Reproductive: Uterus is surgically absent in what appears to be a ?supracervical hysterectomy. There is a 1.9 x 1.7 x 1.8 cm complex ?cystic mass in the cervix. Cervical canal is not clearly delineated ?by MR imaging although this lesion does have a central location ?within the apparent cervical stroma. After IV contrast ?administration enhancing internal septa or thin mural components ?from adjacent cystic lesions are evident. ?  ?A second small nodular midline component is seen along the anterior ?cervix with some indentation of the posterior bladder wall this is ?immediately anterior to the above described multicystic lesion and ?measures approximately 1.3 x 1.1 x 0.7 cm (see axial T2 image 20 of ?series 8). ?  ?Right ovary measures 2.0 x 1.2 x 3.3 cm, unremarkable. ?  ?Left ovary measures 2.8 x 1.3 x 2.3 cm, unremarkable. ?  ?Other:  No intraperitoneal free fluid. ?  ?Musculoskeletal: No focal suspicious marrow enhancement within the ?visualized bony anatomy. ?  ?IMPRESSION: ?1. Patient is presumed to be status post supracervical hysterectomy. ?Close correlation with  surgical records recommended. 1.9 x 1.7 x 1.8 ?cm complex cystic mass is identified in the central cervical stroma. ?Cervical canal not well demonstrated in this lesion may be related ?to the canal or in the central stroma. The lesion may represent a ?tight cluster of multiple cysts or a single multicystic lesion. ?Large nabothian cyst would be a consideration. Cystic cervicitis ?would also be a consideration. Adenoma malignum/neoplasm cannot be ?excluded by imaging. ?2. A second small nodular component along the anterior wall of the ?cervix is immediately anterior to the above described lesion and ?measures up to 1.3 cm in maximum dimension. This lesion does appear ?to generates some mass-effect on the posterior wall of the urinary ?bladder. While imaging features are nonspecific, this does not ?appear overtly  aggressive and may simply represent scar. ?3. Unremarkable MR appearance of the ovaries. ?  ?  ?Electronically Signed ?  By: Misty Stanley M.D. ?  On: 09/17/2021 15:21 ?   ? ? ?Assessment and Plan: ?1. Pelvic pain ?- ibupro

## 2021-11-17 ENCOUNTER — Other Ambulatory Visit: Payer: Self-pay

## 2021-11-17 ENCOUNTER — Ambulatory Visit (INDEPENDENT_AMBULATORY_CARE_PROVIDER_SITE_OTHER): Payer: Managed Care, Other (non HMO) | Admitting: Obstetrics and Gynecology

## 2021-11-17 ENCOUNTER — Encounter: Payer: Self-pay | Admitting: Obstetrics and Gynecology

## 2021-11-17 VITALS — BP 142/87 | HR 76 | Ht 69.0 in | Wt 229.6 lb

## 2021-11-17 DIAGNOSIS — R109 Unspecified abdominal pain: Secondary | ICD-10-CM | POA: Diagnosis not present

## 2021-11-17 DIAGNOSIS — R102 Pelvic and perineal pain unspecified side: Secondary | ICD-10-CM | POA: Insufficient documentation

## 2021-11-17 DIAGNOSIS — R339 Retention of urine, unspecified: Secondary | ICD-10-CM | POA: Diagnosis not present

## 2021-11-17 LAB — POCT URINALYSIS DIPSTICK
Bilirubin, UA: NEGATIVE
Glucose, UA: NEGATIVE
Ketones, UA: NEGATIVE
Leukocytes, UA: NEGATIVE
Nitrite, UA: NEGATIVE
Protein, UA: NEGATIVE
Spec Grav, UA: 1.015 (ref 1.010–1.025)
Urobilinogen, UA: 0.2 E.U./dL
pH, UA: 5 (ref 5.0–8.0)

## 2021-11-17 NOTE — Progress Notes (Signed)
?CC: pelvic/abdominal pain ?Subjective:  ? ? Patient ID: Clovia Reine, female    DOB: 1980-11-26, 41 y.o.   MRN: [id number] ? ?HPI ?41 yo G4P3, c/s x 3, seen for discussion of pelvic pain.  She has had this pain for 3 years.  The pain is described as random, intermittent and crampy.  Pt had a laparoscopic supracervical hysterectomy in 2017.  Discussed this with the patient and she was unsure why the cervix was not removed.  The patient denies constipation/diarrhea.  She does note intermittent dysmenorrhea, usually worse with deep penetration.  The patient also notes abdominal pressure when the cramping occurs.  BC powder and to a lesser extent ibuprofen helps with the pain.   ? ? ?Review of Systems ? ?   ?Objective:  ? Physical Exam ?Constitutional:   ?   Appearance: Normal appearance. She is normal weight.  ?HENT:  ?   Head: Normocephalic and atraumatic.  ?Cardiovascular:  ?   Rate and Rhythm: Normal rate and regular rhythm.  ?Pulmonary:  ?   Effort: Pulmonary effort is normal.  ?   Breath sounds: Normal breath sounds.  ?Abdominal:  ?   General: Abdomen is flat. There is no distension.  ?   Palpations: Abdomen is soft. There is no mass.  ?   Comments: Very mild diffuse tenderness in the lower abdomen , around the area of previous c section scar.  ?Genitourinary: ?   Comments: SSE: cervix appears normal , moderate size nabothian cyst at 6 o'clock ?No CMT or adnexal mass ?No rectal or vaginal pain on palpation, no pain with palpation of vaginal cuff ?Pain symptoms could not be recreated. ?Neurological:  ?   Mental Status: She is alert.  ? ?Vitals:  ? 11/17/21 0818  ?BP: (!) 142/87  ?Pulse: 76  ? ?CLINICAL DATA:  Nodules identified in the vaginal cuff on recent ?ultrasound. ?  ?EXAM: ?MRI PELVIS WITHOUT AND WITH CONTRAST ?  ?TECHNIQUE: ?Multiplanar multisequence MR imaging of the pelvis was performed ?both before and after administration of intravenous contrast. ?  ?CONTRAST:  79m MULTIHANCE GADOBENATE  DIMEGLUMINE 529 MG/ML IV SOLN ?  ?COMPARISON:  Ultrasound exam 09/10/2021 ?  ?FINDINGS: ?Urinary Tract: The urinary bladder appears normal for the degree of ?distention. No evidence for urethral diverticulum. ?  ?Bowel:  Unremarkable visualized pelvic bowel loops. ?  ?Vascular/Lymphatic: No pathologically enlarged lymph nodes. No ?significant vascular abnormality seen. ?  ?Reproductive: Uterus is surgically absent in what appears to be a ?supracervical hysterectomy. There is a 1.9 x 1.7 x 1.8 cm complex ?cystic mass in the cervix. Cervical canal is not clearly delineated ?by MR imaging although this lesion does have a central location ?within the apparent cervical stroma. After IV contrast ?administration enhancing internal septa or thin mural components ?from adjacent cystic lesions are evident. ?  ?A second small nodular midline component is seen along the anterior ?cervix with some indentation of the posterior bladder wall this is ?immediately anterior to the above described multicystic lesion and ?measures approximately 1.3 x 1.1 x 0.7 cm (see axial T2 image 20 of ?series 8). ?  ?Right ovary measures 2.0 x 1.2 x 3.3 cm, unremarkable. ?  ?Left ovary measures 2.8 x 1.3 x 2.3 cm, unremarkable. ?  ?Other:  No intraperitoneal free fluid. ?  ?Musculoskeletal: No focal suspicious marrow enhancement within the ?visualized bony anatomy. ?  ?IMPRESSION: ?1. Patient is presumed to be status post supracervical hysterectomy. ?Close correlation with surgical records recommended. 1.9 x 1.7  x 1.8 ?cm complex cystic mass is identified in the central cervical stroma. ?Cervical canal not well demonstrated in this lesion may be related ?to the canal or in the central stroma. The lesion may represent a ?tight cluster of multiple cysts or a single multicystic lesion. ?Large nabothian cyst would be a consideration. Cystic cervicitis ?would also be a consideration. Adenoma malignum/neoplasm cannot be ?excluded by imaging. ?2. A second  small nodular component along the anterior wall of the ?cervix is immediately anterior to the above described lesion and ?measures up to 1.3 cm in maximum dimension. This lesion does appear ?to generates some mass-effect on the posterior wall of the urinary ?bladder. While imaging features are nonspecific, this does not ?appear overtly aggressive and may simply represent scar. ?3. Unremarkable MR appearance of the ovaries. ? ? ?   ?Assessment & Plan:  ? ?1. Urinary retention ? ?- POCT Urinalysis Dipstick ?- Urine Culture ? ?2. Pelvic pain in female ?Pain could not be recreated on exam ?Will try to get op report and workup from previous gyn in [place]. ?Will try to see if there was any reference to endometriosis and reason for supracervical hyst. ?If endometriosis had been detected, may consider [place] for control of pain. ?However, majority of symptoms do not seem consistent with gyn etiology ? ?3. Abdominal pain in female ?While waiting for previous gyn records, will refer patient to GI for evaluation in their speciality as the areas where she feels the most pain are not consistent with the absence of the uterus. ?- Ambulatory referral to Gastroenterology ? ? ? ?Griffin Basil, MD ?Faculty Attending, [facility]  ?

## 2021-11-17 NOTE — Progress Notes (Signed)
New pt/ referral ?Last Pap 03/05/21 ?Mammogram 04/14/21 ?See recent MR Abdomen and MR Pelvis ? ?Patient reports low abdomen pain and pain "on the inside" ?Reports urination and bowel movements are sometimes painful ?Reports need to change positions on the toilet to completely empty bladder and bowels ?Reports pain with intercourse ?Denies bleeding. ?

## 2021-11-18 ENCOUNTER — Other Ambulatory Visit: Payer: Self-pay | Admitting: Nurse Practitioner

## 2021-11-18 ENCOUNTER — Telehealth: Payer: Self-pay | Admitting: Nurse Practitioner

## 2021-11-18 DIAGNOSIS — J4520 Mild intermittent asthma, uncomplicated: Secondary | ICD-10-CM

## 2021-11-18 NOTE — Progress Notes (Signed)
Order for [place] printed and can be faxed to [place] of patient/insurance choice. ?

## 2021-11-18 NOTE — Telephone Encounter (Signed)
Glyn Ade from [facility] called and stated patient has history of asthma and needs a nebulizer prescription to a DME supply store. If you have any questions please call [phone] ?

## 2021-11-19 ENCOUNTER — Encounter: Payer: Self-pay | Admitting: Nurse Practitioner

## 2021-11-19 LAB — URINE CULTURE: Organism ID, Bacteria: NO GROWTH

## 2021-11-26 ENCOUNTER — Encounter: Payer: Self-pay | Admitting: Nurse Practitioner

## 2021-11-30 ENCOUNTER — Telehealth: Payer: Self-pay | Admitting: Nurse Practitioner

## 2021-11-30 NOTE — Telephone Encounter (Signed)
Called Marcella LVM to call back with a fax number ?

## 2021-11-30 NOTE — Telephone Encounter (Signed)
Didn't we do this already?

## 2021-11-30 NOTE — Telephone Encounter (Signed)
Kimberly Armstrong with [facility] called and is requesting for a nebulizer kit to be sent in for this patient due to her asthma. Please advise. [phone] ?

## 2021-12-07 ENCOUNTER — Other Ambulatory Visit: Payer: Self-pay

## 2021-12-07 DIAGNOSIS — N2889 Other specified disorders of kidney and ureter: Secondary | ICD-10-CM

## 2021-12-14 ENCOUNTER — Other Ambulatory Visit: Payer: Self-pay | Admitting: Nurse Practitioner

## 2021-12-14 DIAGNOSIS — M545 Low back pain, unspecified: Secondary | ICD-10-CM

## 2021-12-14 NOTE — Progress Notes (Incomplete)
? ?12/14/21 ?7:42 PM  ? ?Kimberly Armstrong ?[birth year] ?[id number] ? ?Referring provider:  ?Ronnell Freshwater, NP ?[address] ?[place],  [place] [address] ?No chief complaint on file. ? ? ? ? ?HPI: ?Kimberly Armstrong is a 41 y.o.female who presents today for further evaluation of left renal mass. ? ?She underwent a MRI of abdomen on 10/01/2021 that visualized a simple cyst in the upper pole of the right kidney, measuring 2.5 x ?2.3 cm. The left kidney appears normal on all pulse sequences, demonstrating no restricted diffusion or abnormal enhancement. 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 No hydronephrosis or definite collecting system duplication. ? ?She was seen by gynecology on 11/17/2021 at the time she was experiencing urinary retention. POCT urinalysis dipstick was unremarkable. Urine culture showed no growth.  ? ? ? ? ? ? ?PMH: ?Past Medical History:  ?Diagnosis Date  ? Asthma   ? ? ?Surgical History: ?Past Surgical History:  ?Procedure Laterality Date  ? ABDOMINAL HYSTERECTOMY N/A   ? [employer]reesia 11/30/2020  ? BACK SURGERY    ? carpel tunnel    ? CESAREAN SECTION    ? CESAREAN SECTION N/A   ? [employer]reesia 11/30/2020  ? EYE SURGERY N/A   ? [facility]reesia 11/30/2020  ? PARTIAL HYSTERECTOMY    ? SPINE SURGERY N/A   ? [employer]reesia 11/30/2020  ? TUBAL LIGATION N/A   ? [facility]reesia 11/30/2020  ? ? ?Home Medications:  ?Allergies as of 12/16/2021   ?No Known Allergies ?  ? ?  ?Medication List  ?  ? ?  ? Accurate as of December 14, 2021  7:42 PM. If you have any questions, ask your nurse or doctor.  ?  ?  ? ?  ? ?albuterol 108 (90 Base) MCG/ACT inhaler ?Commonly known as: VENTOLIN HFA ?Inhale 2 puffs into the lungs every 6 (six) hours as needed for wheezing or shortness of breath. ?  ?baclofen 10 MG tablet ?Commonly known as: LIORESAL ?TAKE 1 TABLET BY MOUTH THREE TIMES A DAY ?  ?budesonide-formoterol 160-4.5 MCG/ACT inhaler ?Commonly known as: SYMBICORT ?Inhale 2  puffs into the lungs 2 (two) times daily. ?  ?cetirizine 10 MG tablet ?Commonly known as: ZYRTEC ?Take 1 tablet (10 mg total) by mouth daily. ?  ?diclofenac 75 MG EC tablet ?Commonly known as: VOLTAREN ?Take 1 tablet (75 mg total) by mouth 2 (two) times daily. ?  ?fluticasone 50 MCG/ACT nasal spray ?Commonly known as: FLONASE ?SPRAY 2 SPRAYS INTO EACH NOSTRIL EVERY DAY ?  ?ibuprofen 600 MG tablet ?Commonly known as: ADVIL ?Take 1 tablet (600 mg total) by mouth every 8 (eight) hours as needed. ?  ?montelukast 10 MG tablet ?Commonly known as: SINGULAIR ?Take 1 tablet (10 mg total) by mouth at bedtime. ?  ?valACYclovir 500 MG tablet ?Commonly known as: VALTREX ?Take 2 tablets at onset of cold sore, then 1 tablet BID until resolved; repeat as needed ?  ? ?  ? ? ?Allergies:  ?No Known Allergies ? ?Family History: ?Family History  ?Problem Relation Age of Onset  ? Dementia Paternal Grandmother   ? Breast cancer Maternal Grandmother   ? Breast cancer Mother   ? Heart attack Mother   ? Hypertension Mother   ? Diabetes Sister   ? ? ?Social History:  reports that she has been smoking cigarettes. She started smoking about 21 years ago. She has been smoking an average of 1 pack per day. She has never  used smokeless tobacco. She reports current alcohol use of about 2.0 standard drinks per week. She reports that she does not use drugs. ? ? ?Physical Exam: ?There were no vitals taken for this visit.  ?Constitutional:  Alert and oriented, No acute distress. ?HEENT: [place] AT, moist mucus membranes.  Trachea midline, no masses. ?Cardiovascular: No clubbing, cyanosis, or edema. ?Respiratory: Normal respiratory effort, no increased work of breathing. ?Skin: No rashes, bruises or suspicious lesions. ?Neurologic: Grossly intact, no focal deficits, moving all 4 extremities. ?Psychiatric: Normal mood and affect. ? ?Laboratory Data: ? ?Lab Results  ?Component Value Date  ? CREATININE 0.82 12/10/2020  ? ?Lab Results  ?Component Value Date  ?  HGBA1C 5.6 12/10/2020  ? ? ?Urinalysis ? ? ?Pertinent Imaging: ?CLINICAL DATA:  Indeterminate left renal mass on ultrasound. ?Previous hysterectomy. ?  ?EXAM: ?MRI ABDOMEN WITHOUT AND WITH CONTRAST ?  ?TECHNIQUE: ?Multiplanar multisequence MR imaging of the abdomen was performed ?both before and after the administration of intravenous contrast. ?  ?CONTRAST:  71m MULTIHANCE GADOBENATE DIMEGLUMINE 529 MG/ML IV SOLN ?  ?COMPARISON:  Abdominal ultrasound 09/10/2021 ?  ?FINDINGS: ?Lower chest:  The visualized lower chest appears unremarkable. ?  ?Hepatobiliary: The liver appears normal, without focal abnormality ?or abnormal enhancement. No evidence of gallstones, gallbladder wall ?thickening or biliary dilatation. ?  ?Pancreas: Unremarkable. No pancreatic ductal dilatation or ?surrounding inflammatory changes. ?  ?Spleen: Normal in size without focal abnormality. ?  ?Adrenals/Urinary Tract: Both adrenal glands appear normal. There is ?a simple cyst in the upper pole of the right kidney, measuring 2.5 x ?2.3 cm. The left kidney appears normal on all pulse sequences, ?demonstrating no restricted diffusion or abnormal enhancement. The ?suggested left renal mass on prior ultrasound appears to be ?secondary to pseudotumor. No hydronephrosis or definite collecting ?system duplication. ?  ?Stomach/Bowel: The stomach appears unremarkable for its degree of ?distension. No evidence of bowel wall thickening, distention or ?surrounding inflammatory change. ?  ?Vascular/Lymphatic: There are no enlarged abdominal lymph nodes. No ?significant vascular findings. ?  ?Other: No evidence of abdominal wall hernia or ascites. ?  ?Musculoskeletal: No acute or significant osseous findings. Mild ?lumbar facet arthropathy. ?  ?IMPRESSION: ?1. The left kidney appears normal, without evidence of mass lesion. ?2. Simple cyst in the upper pole the right kidney. ?3. Otherwise unremarkable examination. ?  ?  ?Electronically Signed ?  By: WRichardean SaleM.D. ?  On: 10/04/2021 11:08 ?  ? ?I have personally reviewed the images and agree with radiologist interpretation.  ? ? ? ?Assessment & Plan:   ? ? ?No follow-ups on file. ? ?I,Kailey Littlejohn,acting as a scribe for AHollice Espy MD.,have documented all relevant documentation on the behalf of AHollice Espy MD,as directed by  AHollice Espy MD while in the presence of AHollice Espy MD. ? ? ?[facility]?[address] ?[place] [id number]?[phone]?

## 2021-12-15 ENCOUNTER — Encounter: Payer: Self-pay | Admitting: Nurse Practitioner

## 2021-12-15 ENCOUNTER — Ambulatory Visit (INDEPENDENT_AMBULATORY_CARE_PROVIDER_SITE_OTHER): Payer: Managed Care, Other (non HMO) | Admitting: Nurse Practitioner

## 2021-12-15 VITALS — BP 124/88 | HR 78 | Ht 69.0 in | Wt 228.6 lb

## 2021-12-15 DIAGNOSIS — R1030 Lower abdominal pain, unspecified: Secondary | ICD-10-CM | POA: Diagnosis not present

## 2021-12-15 DIAGNOSIS — R634 Abnormal weight loss: Secondary | ICD-10-CM | POA: Diagnosis not present

## 2021-12-15 MED ORDER — DICYCLOMINE HCL 10 MG PO CAPS: 10.0000 mg | ORAL_CAPSULE | Freq: Two times a day (BID) | ORAL | 2 refills | Status: DC

## 2021-12-15 NOTE — Patient Instructions (Signed)
We have sent the following medications to your pharmacy for you to pick up at your convenience:  Dicyclomine  ? ?Keep a food diary ? ?Take Miralax 1/2 capful daily you can increase to 1 capful if needed ? ?If you are age 43 or older, your body mass index should be between 23-30. Your Body mass index is 33.76 kg/m?[name] If this is out of the aforementioned range listed, please consider follow up with your Primary Care Provider. ? ?If you are age 63 or younger, your body mass index should be between 19-25. Your Body mass index is 33.76 kg/m?[name] If this is out of the aformentioned range listed, please consider follow up with your Primary Care Provider.  ? ?________________________________________________________ ? ?The [facility] providers would like to encourage you to use [facility] to communicate with providers for non-urgent requests or questions.  Due to long hold times on the telephone, sending your provider a message by [facility] may be a faster and more efficient way to get a response.  Please allow 48 business hours for a response.  Please remember that this is for non-urgent requests.  ?_______________________________________________________  ? ?Thank you for choosing [facility] ? ?Kavitha Nandigam,MD  ?

## 2021-12-15 NOTE — Progress Notes (Signed)
? ? ?Assessment  ? ?Patient profile:  ?Kimberly Armstrong is a 41 y.o. female , new to practice, referred by her GYN for abdominal pain  . Past medical history is significant for  asthma, hysterectomy, C-section. See PMH below for any additional history ? ?Five year history of intermittent generalized crampy lower abdominal pain,  intestinal gurgling, occasional sensation of incomplete evacuation. Recent abdominal MRI without GI abnormalities.  Negative GYN evaluation. She has been able to correlate symptoms with red meat and carbonated drinks and is keeping food diary to look for additional culprits. Suspect symptoms are functional  ? ?Weight loss, down about 20 pounds over last several months. Can probably be explained (at least in part) to her monitoring what she eats due to GI symptoms. However it is still concerning that she has had to make such major dietary changes to avoid discomfort. Recent MRI without abdominal findings / malignancy. Her weight has stabilized. ? ? ?Plan  ? ?Trial of dicyclomine 10 mg as needed for abdominal pain ?For now have asked her to hold off on the magnesium supplement.  I would like for her to try MiraLAX one half capful daily.  If needed she can increase dose to a full capful daily.  ?Continue keeping a food diary ?If starts losing weight again , especially is GI symptoms do not resolved then will likely need endoscopic evaluation  ?I will see her back in a couple of months.  She will call in the interim if symptoms worsen ? ?History of Present Illness  ? ?Chief complaint:  irregular bowels, lower abdominal cramping, bloating.  ? ?Kimberly Armstrong began having generalized lower abdominal pain in 2018.  She initially thought it was phantom pain related to her hysterectomy in 2017.  The lower abdominal pain has gotten worse and more frequent over the last 1.5 years. She is having episodes 1-2 times a month now. She describes the lower abdominal pain as crampy in nature and it is  often associated with increased intestinal gas and "bubbling" in abdomen.   Sometimes she gets the pain while having a BM and it doesn't always resolve after defecation.  She has started keeping a food diary and realizes that red meat and carbonated beverages bring on the symptoms. Her stools are generally soft but occasionally she has a loose BM. On average she has a BM about every 3 days. Sometimes she doesn't feel completely evacuated. Repositioning herself of toilet is often needed to facilitate passage of stool. She frequently feels bloated all over including her fingers. A few days ago she started a magnesium supplement to help with her digestive system.  She started at 1/2 tablespoon daily for 3 days then advanced to 1 tablespoon.  She was starting to feel better but having cramping again today.  ? ?Patient has been evaluated by GYN who apparently did not feel her pain was gynecologic in nature. Of note, she had an MRI of the abdomen in February 2023 to evaluate an indeterminate renal mass.  There were no GI findings.  No left renal lesion seen ? ?Her weight is down from baseline of 252 around 230.  She has been watching her diet closely and more of aware of what she is eating which has probably contributed to the weight loss  She has not had any blood in her stool.  No family history of colon cancer. ? ? Previous Labs / Imaging:: ? ?  Latest Ref Rng & Units 12/10/2020  ?  8:22 AM  09/08/2019  ?  2:40 PM 03/23/2019  ? 12:07 PM  ?CBC  ?WBC 3.4 - 10.8 x10E3/uL 4.7   6.8   5.4    ?Hemoglobin 11.1 - 15.9 g/dL 16.6   16.2   17.2    ?Hematocrit 34.0 - 46.6 % 47.9   47.8   50.7    ?Platelets 150 - 450 x10E3/uL 176   210   143    ? ? ?Imaging:  ?MR Abdomen W Wo Contrast ?CLINICAL DATA:  Indeterminate left renal mass on ultrasound. ?Previous hysterectomy. ? ?EXAM: ?MRI ABDOMEN WITHOUT AND WITH CONTRAST ? ?TECHNIQUE: ?Multiplanar multisequence MR imaging of the abdomen was performed ?both before and after the administration  of intravenous contrast. ? ?CONTRAST:  70m MULTIHANCE GADOBENATE DIMEGLUMINE 529 MG/ML IV SOLN ? ?COMPARISON:  Abdominal ultrasound 09/10/2021 ? ?FINDINGS: ?Lower chest:  The visualized lower chest appears unremarkable. ? ?Hepatobiliary: The liver appears normal, without focal abnormality ?or abnormal enhancement. No evidence of gallstones, gallbladder wall ?thickening or biliary dilatation. ? ?Pancreas: Unremarkable. No pancreatic ductal dilatation or ?surrounding inflammatory changes. ? ?Spleen: Normal in size without focal abnormality. ? ?Adrenals/Urinary Tract: Both adrenal glands appear normal. There is ?a simple cyst in the upper pole of the right kidney, measuring 2.5 x ?2.3 cm. The left kidney appears normal on all pulse sequences, ?demonstrating no restricted diffusion or abnormal enhancement. The ?suggested left renal mass on prior ultrasound appears to be ?secondary to pseudotumor. No hydronephrosis or definite collecting ?system duplication. ? ?Stomach/Bowel: The stomach appears unremarkable for its degree of ?distension. No evidence of bowel wall thickening, distention or ?surrounding inflammatory change. ? ?Vascular/Lymphatic: There are no enlarged abdominal lymph nodes. No ?significant vascular findings. ? ?Other: No evidence of abdominal wall hernia or ascites. ? ?Musculoskeletal: No acute or significant osseous findings. Mild ?lumbar facet arthropathy. ? ?IMPRESSION: ?1. The left kidney appears normal, without evidence of mass lesion. ?2. Simple cyst in the upper pole the right kidney. ?3. Otherwise unremarkable examination. ? ?Electronically Signed ?  By: WRichardean SaleM.D. ?  On: 10/04/2021 11:08 ? ? ?Past Medical History:  ?Diagnosis Date  ? Asthma   ? ?Past Surgical History:  ?Procedure Laterality Date  ? ABDOMINAL HYSTERECTOMY N/A   ? [facility] 11/30/2020  ? BACK SURGERY    ? carpel tunnel    ? CESAREAN SECTION    ? CESAREAN SECTION N/A   ? [employer]reesia 11/30/2020  ? EYE SURGERY N/A   ? [employer]reesia  11/30/2020  ? PARTIAL HYSTERECTOMY    ? SPINE SURGERY N/A   ? [facility]reesia 11/30/2020  ? TUBAL LIGATION N/A   ? [employer]reesia 11/30/2020  ? ?Family History  ?Problem Relation Age of Onset  ? Breast cancer Mother   ? Heart attack Mother   ? Hypertension Mother   ? Diabetes Sister   ? Breast cancer Maternal Grandmother   ? Dementia Paternal Grandmother   ? Colon cancer Neg Hx   ? Esophageal cancer Neg Hx   ? Stomach cancer Neg Hx   ? Colon polyps Neg Hx   ? ?Social History  ? ?Tobacco Use  ? Smoking status: Every Day  ?  Packs/day: 1.00  ?  Types: Cigarettes  ?  Start date: 12/03/2000  ? Smokeless tobacco: Never  ?Vaping Use  ? Vaping Use: Never used  ?Substance Use Topics  ? Alcohol use: Yes  ?  Alcohol/week: 2.0 standard drinks  ?  Types: 2 Glasses of wine per week  ? Drug use: Never  ? ?  Current Outpatient Medications  ?Medication Sig Dispense Refill  ? albuterol (VENTOLIN HFA) 108 (90 Base) MCG/ACT inhaler Inhale 2 puffs into the lungs every 6 (six) hours as needed for wheezing or shortness of breath. 1 each 2  ? baclofen (LIORESAL) 10 MG tablet TAKE 1 TABLET BY MOUTH THREE TIMES A DAY 90 tablet 1  ? budesonide-formoterol (SYMBICORT) 160-4.5 MCG/ACT inhaler Inhale 2 puffs into the lungs 2 (two) times daily. 3 each 1  ? cetirizine (ZYRTEC) 10 MG tablet Take 1 tablet (10 mg total) by mouth daily. 90 tablet 1  ? diclofenac (VOLTAREN) 75 MG EC tablet Take 1 tablet (75 mg total) by mouth 2 (two) times daily. 90 tablet 1  ? fluticasone (FLONASE) 50 MCG/ACT nasal spray SPRAY 2 SPRAYS INTO EACH NOSTRIL EVERY DAY 16 mL 3  ? ibuprofen (ADVIL) 600 MG tablet Take 1 tablet (600 mg total) by mouth every 8 (eight) hours as needed. 30 tablet 0  ? montelukast (SINGULAIR) 10 MG tablet Take 1 tablet (10 mg total) by mouth at bedtime. 90 tablet 3  ? valACYclovir (VALTREX) 500 MG tablet Take 2 tablets at onset of cold sore, then 1 tablet BID until resolved; repeat as needed 90 tablet 1  ? ?No current facility-administered medications for this  visit.  ? ?No Known Allergies ? ? ?Review of Systems: ?All systems reviewed and negative except where noted in HPI.  ? ?Physical Exam  ? ?Wt Readings from Last 3 Encounters:  ?12/15/21 228 lb 9.6 oz (103.7

## 2021-12-16 ENCOUNTER — Ambulatory Visit: Payer: Managed Care, Other (non HMO) | Admitting: Urology

## 2021-12-18 NOTE — Progress Notes (Signed)
Addendum: Reviewed and agree with assessment and management plan. Doyne Ellinger M, MD  

## 2021-12-24 ENCOUNTER — Telehealth: Payer: Managed Care, Other (non HMO) | Admitting: Physician Assistant

## 2021-12-24 DIAGNOSIS — T7840XA Allergy, unspecified, initial encounter: Secondary | ICD-10-CM | POA: Diagnosis not present

## 2021-12-24 MED ORDER — PREDNISONE 10 MG PO TABS: ORAL_TABLET | ORAL | 0 refills | Status: AC

## 2021-12-24 NOTE — Patient Instructions (Signed)
?  [name], thank you for joining [name], PA-C for today's virtual visit.  While this provider is not your primary care provider (PCP), if your PCP is located in our provider database this encounter information will be shared with them immediately following your visit. ? ?Consent: ?(Patient) Miamor Ayler provided verbal consent for this virtual visit at the beginning of the encounter. ? ?Current Medications: ? ?Current Outpatient Medications:  ?  albuterol (VENTOLIN HFA) 108 (90 Base) MCG/ACT inhaler, Inhale 2 puffs into the lungs every 6 (six) hours as needed for wheezing or shortness of breath., Disp: 1 each, Rfl: 2 ?  baclofen (LIORESAL) 10 MG tablet, TAKE 1 TABLET BY MOUTH THREE TIMES A DAY, Disp: 90 tablet, Rfl: 1 ?  budesonide-formoterol (SYMBICORT) 160-4.5 MCG/ACT inhaler, Inhale 2 puffs into the lungs 2 (two) times daily., Disp: 3 each, Rfl: 1 ?  cetirizine (ZYRTEC) 10 MG tablet, Take 1 tablet (10 mg total) by mouth daily., Disp: 90 tablet, Rfl: 1 ?  diclofenac (VOLTAREN) 75 MG EC tablet, Take 1 tablet (75 mg total) by mouth 2 (two) times daily., Disp: 90 tablet, Rfl: 1 ?  dicyclomine (BENTYL) 10 MG capsule, Take 1 capsule (10 mg total) by mouth in the morning and at bedtime. As needed, Disp: 30 capsule, Rfl: 2 ?  fluticasone (FLONASE) 50 MCG/ACT nasal spray, SPRAY 2 SPRAYS INTO EACH NOSTRIL EVERY DAY, Disp: 16 mL, Rfl: 3 ?  ibuprofen (ADVIL) 600 MG tablet, Take 1 tablet (600 mg total) by mouth every 8 (eight) hours as needed., Disp: 30 tablet, Rfl: 0 ?  montelukast (SINGULAIR) 10 MG tablet, Take 1 tablet (10 mg total) by mouth at bedtime., Disp: 90 tablet, Rfl: 3 ?  valACYclovir (VALTREX) 500 MG tablet, Take 2 tablets at onset of cold sore, then 1 tablet BID until resolved; repeat as needed, Disp: 90 tablet, Rfl: 1  ? ?Medications ordered in this encounter:  ?No orders of the defined types were placed in this encounter. ?  ? ?*If you need refills on other  medications prior to your next appointment, please contact your pharmacy* ? ?Follow-Up: ?Call back or seek an in-person evaluation if the symptoms worsen or if the condition fails to improve as anticipated. ? ?Other Instructions ?Please keep well-hydrated. ?Continue your inhalers and Singulair. ?Switch the Zyrtec for Xyzal OTC once daily.  ?Take the steroid taper as directed. ?I recommend washing pillow case and bed linens just to be cautious.  ?Schedule a follow-up with your PCP as discussed.  ? ? ?If you have been instructed to have an in-person evaluation today at a local Urgent Care facility, please use the link below. It will take you to a list of all of our available [facility], including address, phone number and hours of operation. Please do not delay care.  ?[facility] ? ?If you or a family member do not have a primary care provider, use the link below to schedule a visit and establish care. When you choose a [facility] primary care physician or advanced practice provider, you gain a long-term partner in health. ?Find a Primary Care Provider ? ?Learn more about 's in-office and virtual care options: ?[facility]  ?

## 2021-12-24 NOTE — Progress Notes (Signed)
?Virtual Visit Consent  ? ?Kimberly Armstrong, you are scheduled for a virtual visit with a [facility] provider today.   ?  ?Just as with appointments in the office, your consent must be obtained to participate.  Your consent will be active for this visit and any virtual visit you may have with one of our providers in the next 365 days.   ?  ?If you have a MyChart account, a copy of this consent can be sent to you electronically.  All virtual visits are billed to your insurance company just like a traditional visit in the office.   ? ?As this is a virtual visit, video technology does not allow for your provider to perform a traditional examination.  This may limit your provider's ability to fully assess your condition.  If your provider identifies any concerns that need to be evaluated in person or the need to arrange testing (such as labs, EKG, etc.), we will make arrangements to do so.   ?  ?Although advances in technology are sophisticated, we cannot ensure that it will always work on either your end or our end.  If the connection with a video visit is poor, the visit may have to be switched to a telephone visit.  With either a video or telephone visit, we are not always able to ensure that we have a secure connection.    ? ?Also, by engaging in this virtual visit, you consent to the provision of healthcare. Additionally, you authorize for your insurance to be billed (if applicable) for the services provided during this visit.  ? ?I need to obtain your verbal consent now.   Are you willing to proceed with your visit today?  ?  ?Kimberly Armstrong has provided verbal consent on 12/24/2021 for a virtual visit (video or telephone). ?  ?Kimberly Rio, PA-C  ? ?Date: 12/24/2021 8:35 AM ? ? ?Virtual Visit via Video Note  ? ?I, Kimberly Armstrong, connected with  Kimberly Armstrong  ([id number], [birth year]) on 12/24/21 at  8:15 AM EDT by a video-enabled telemedicine application and verified  that I am speaking with the correct person using two identifiers. ? ?Location: ?Patient: Virtual Visit Location Patient: Home ?Provider: Virtual Visit Location Provider: Home Office ?  ?I discussed the limitations of evaluation and management by telemedicine and the availability of in person appointments. The patient expressed understanding and agreed to proceed.   ? ?History of Present Illness: ?Kimberly Armstrong is a 41 y.o. who identifies as a female who was assigned female at birth, and is being seen today for some itching and swelling of her face. Notes starting Monday with a slight bit of puffiness of her face with eyelid swelling and some irritation of her eyes. Notes symptoms have waxed and waned since onset. Noted itching of face and around her eyes and the bridge of her nose. Denies change in diet, soaps, lotions, detergents, etc. Now noting some tightening in the chest, watery eyes now. Has history of asthma but denies known history of substantial seasonal allergies. Notes increase in rescue inhaler use over the past couple of days.   ? ?HPI: HPI  ?Problems:  ?Patient Active Problem List  ? Diagnosis Date Noted  ? Pelvic pain in female 11/17/2021  ? Abdominal pain in female 11/17/2021  ? Renal mass, left 10/05/2021  ? 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 Tobacco use disorder, continuous 10/05/2021  ? Raynaud's phenomenon without gangrene 10/05/2021  ?  Generalized abdominal tenderness without rebound tenderness 09/07/2021  ? Seasonal allergies 09/07/2021  ? Acute left-sided low back pain without sciatica 09/07/2021  ? Cold sore 09/07/2021  ? Encounter for well woman exam with routine gynecological exam 03/15/2021  ? Neck muscle spasm 03/15/2021  ? Mild intermittent asthma without complication 01/75/1025  ? Body mass index (BMI) of 33.0-33.9 in adult 03/15/2021  ? Screening for STD (sexually transmitted disease) 03/15/2021  ? Need for Tdap vaccination 03/15/2021  ? Encounter to  establish care 12/03/2020  ? 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 Moderate cigarette smoker 12/03/2020  ? Encounter for screening mammogram for breast cancer 12/03/2020  ? Asthma [birth year]  ?  ?Allergies: No Known Allergies ?Medications:  ?Current Outpatient Medications:  ?  predniSONE (DELTASONE) 10 MG tablet, Take 4 tablets (40 mg total) by mouth daily with breakfast for 2 days, THEN 3 tablets (30 mg total) daily with breakfast for 2 days, THEN 2 tablets (20 mg total) daily with breakfast for 2 days, THEN 1 tablet (10 mg total) daily with breakfast for 2 days, THEN 0.5 tablets (5 mg total) daily with breakfast for 2 days., Disp: 21 tablet, Rfl: 0 ?  albuterol (VENTOLIN HFA) 108 (90 Base) MCG/ACT inhaler, Inhale 2 puffs into the lungs every 6 (six) hours as needed for wheezing or shortness of breath., Disp: 1 each, Rfl: 2 ?  baclofen (LIORESAL) 10 MG tablet, TAKE 1 TABLET BY MOUTH THREE TIMES A DAY, Disp: 90 tablet, Rfl: 1 ?  budesonide-formoterol (SYMBICORT) 160-4.5 MCG/ACT inhaler, Inhale 2 puffs into the lungs 2 (two) times daily., Disp: 3 each, Rfl: 1 ?  cetirizine (ZYRTEC) 10 MG tablet, Take 1 tablet (10 mg total) by mouth daily., Disp: 90 tablet, Rfl: 1 ?  diclofenac (VOLTAREN) 75 MG EC tablet, Take 1 tablet (75 mg total) by mouth 2 (two) times daily., Disp: 90 tablet, Rfl: 1 ?  dicyclomine (BENTYL) 10 MG capsule, Take 1 capsule (10 mg total) by mouth in the morning and at bedtime. As needed, Disp: 30 capsule, Rfl: 2 ?  fluticasone (FLONASE) 50 MCG/ACT nasal spray, SPRAY 2 SPRAYS INTO EACH NOSTRIL EVERY DAY, Disp: 16 mL, Rfl: 3 ?  ibuprofen (ADVIL) 600 MG tablet, Take 1 tablet (600 mg total) by mouth every 8 (eight) hours as needed., Disp: 30 tablet, Rfl: 0 ?  montelukast (SINGULAIR) 10 MG tablet, Take 1 tablet (10 mg total) by mouth at bedtime., Disp: 90 tablet, Rfl: 3 ?  valACYclovir (VALTREX) 500 MG tablet, Take 2 tablets at onset of cold sore, then 1 tablet BID until  resolved; repeat as needed, Disp: 90 tablet, Rfl: 1 ? ?Observations/Objective: ?Patient is well-developed, well-nourished in no acute distress.  ?Resting comfortably at home.  ?Head is normocephalic, atraumatic.  ?No labored breathing. ?Speech is clear and coherent with logical content.  ?Patient is alert and oriented at baseline.  ?Mild puffiness underneath eyes. No facial rash or lip swelling noted on examination.  ? ?Assessment and Plan: ?1. Allergic reaction, initial encounter ?- predniSONE (DELTASONE) 10 MG tablet; Take 4 tablets (40 mg total) by mouth daily with breakfast for 2 days, THEN 3 tablets (30 mg total) daily with breakfast for 2 days, THEN 2 tablets (20 mg total) daily with breakfast for 2 days, THEN 1 tablet (10 mg total) daily with breakfast for 2 days, THEN 0.5 tablets (5 mg total) daily with breakfast for 2 days.  Dispense: 21 tablet; Refill: 0 ? ?Unknown trigger. She has also had  increase in just her seasonal allergy and asthma symptoms as well. Supportive measures and OTC medications reviewed. Start prednisone taper. Switch Zyrtec for Xyzal. Continue Singulair, Symbicort and Ventolin. She is to keep a symptom journal and schedule an in-person follow-up with her PCP regarding this reaction and need for better control of chronic asthma giving daily rescue inhaler use for some time.  ? ?Follow Up Instructions: ?I discussed the assessment and treatment plan with the patient. The patient was provided an opportunity to ask questions and all were answered. The patient agreed with the plan and demonstrated an understanding of the instructions.  A copy of instructions were sent to the patient via MyChart unless otherwise noted below.  ? ?The patient was advised to call back or seek an in-person evaluation if the symptoms worsen or if the condition fails to improve as anticipated. ? ?Time:  ?I spent 10 minutes with the patient via telehealth technology discussing the above problems/concerns.   ? ?Kimberly Rio, PA-C ?

## 2021-12-29 NOTE — Progress Notes (Incomplete)
? ?12/29/21 ?6:41 PM  ? ?Kimberly Armstrong ?[birth year] ?[id number] ? ?Referring provider:  ?Kimberly Freshwater, NP ?[address],  [place] [address] ?No chief complaint on file. ? ? ? ? ?HPI: ?Kimberly Armstrong is a 41 y.o.female who presents today for further evaluation of left renal mass.  ? ?She underwent an abdominal ultrasound on 09/10/2021 for further evaluation of abdominal pain it visualized an interpolar 5.7 cm indeterminate mass. MRI of  abdomen on 10/01/2021 to further evaluate indeterminate left renal mass visualized a simple cyst in the upper pole of the right kidney, measuring 2.5 x 2.3 cm. The left kidney appears normal on all pulse sequences, demonstrating no restricted diffusion or abnormal enhancement. 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 No hydronephrosis or definite collecting system duplication. ? ? ? ? ? ?PMH: ?Past Medical History:  ?Diagnosis Date  ? Asthma   ? ? ?Surgical History: ?Past Surgical History:  ?Procedure Laterality Date  ? ABDOMINAL HYSTERECTOMY N/A   ? [facility] 11/30/2020  ? BACK SURGERY    ? carpel tunnel    ? CESAREAN SECTION    ? CESAREAN SECTION N/A   ? [facility] 11/30/2020  ? EYE SURGERY N/A   ? [facility] 11/30/2020  ? PARTIAL HYSTERECTOMY    ? SPINE SURGERY N/A   ? [facility] 11/30/2020  ? TUBAL LIGATION N/A   ? [facility] 11/30/2020  ? ? ?Home Medications:  ?Allergies as of 12/30/2021   ?No Known Allergies ?  ? ?  ?Medication List  ?  ? ?  ? Accurate as of Dec 29, 2021  6:41 PM. If you have any questions, ask your nurse or doctor.  ?  ?  ? ?  ? ?albuterol 108 (90 Base) MCG/ACT inhaler ?Commonly known as: VENTOLIN HFA ?Inhale 2 puffs into the lungs every 6 (six) hours as needed for wheezing or shortness of breath. ?  ?baclofen 10 MG tablet ?Commonly known as: LIORESAL ?TAKE 1 TABLET BY MOUTH THREE TIMES A DAY ?  ?budesonide-formoterol 160-4.5 MCG/ACT inhaler ?Commonly known as: SYMBICORT ?Inhale 2  puffs into the lungs 2 (two) times daily. ?  ?cetirizine 10 MG tablet ?Commonly known as: ZYRTEC ?Take 1 tablet (10 mg total) by mouth daily. ?  ?diclofenac 75 MG EC tablet ?Commonly known as: VOLTAREN ?Take 1 tablet (75 mg total) by mouth 2 (two) times daily. ?  ?dicyclomine 10 MG capsule ?Commonly known as: BENTYL ?Take 1 capsule (10 mg total) by mouth in the morning and Armstrong bedtime. As needed ?  ?fluticasone 50 MCG/ACT nasal spray ?Commonly known as: FLONASE ?SPRAY 2 SPRAYS INTO EACH NOSTRIL EVERY DAY ?  ?ibuprofen 600 MG tablet ?Commonly known as: ADVIL ?Take 1 tablet (600 mg total) by mouth every 8 (eight) hours as needed. ?  ?montelukast 10 MG tablet ?Commonly known as: SINGULAIR ?Take 1 tablet (10 mg total) by mouth Armstrong bedtime. ?  ?predniSONE 10 MG tablet ?Commonly known as: DELTASONE ?Take 4 tablets (40 mg total) by mouth daily with breakfast for 2 days, THEN 3 tablets (30 mg total) daily with breakfast for 2 days, THEN 2 tablets (20 mg total) daily with breakfast for 2 days, THEN 1 tablet (10 mg total) daily with breakfast for 2 days, THEN 0.5 tablets (5 mg total) daily with breakfast for 2 days. ?Start taking on: December 24, 2021 ?  ?valACYclovir 500 MG tablet ?Commonly known as: VALTREX ?Take 2 tablets Armstrong onset of cold  sore, then 1 tablet BID until resolved; repeat as needed ?  ? ?  ? ? ?Allergies:  ?No Known Allergies ? ?Family History: ?Family History  ?Problem Relation Age of Onset  ? Breast cancer Mother   ? Heart attack Mother   ? Hypertension Mother   ? Diabetes Sister   ? Breast cancer Maternal Grandmother   ? Dementia Paternal Grandmother   ? Colon cancer Neg Hx   ? Esophageal cancer Neg Hx   ? Stomach cancer Neg Hx   ? Colon polyps Neg Hx   ? ? ?Social History:  reports that she has been smoking cigarettes. She started smoking about 21 years ago. She has been smoking an average of 1 pack per day. She has never used smokeless tobacco. She reports current alcohol use of about 2.0 standard drinks per  week. She reports that she does not use drugs. ? ? ?Physical Exam: ?There were no vitals taken for this visit.  ?Constitutional:  Alert and oriented, No acute distress. ?HEENT: Kimberly Armstrong, moist mucus membranes.  Trachea midline, no masses. ?Cardiovascular: No clubbing, cyanosis, or edema. ?Respiratory: Normal respiratory effort, no increased work of breathing. ?Skin: No rashes, bruises or suspicious lesions. ?Neurologic: Grossly intact, no focal deficits, moving all 4 extremities. ?Psychiatric: Normal mood and affect. ? ?Laboratory Data: ? ?Lab Results  ?Component Value Date  ? CREATININE 0.82 12/10/2020  ? ?Lab Results  ?Component Value Date  ? HGBA1C 5.6 12/10/2020  ? ? ?Urinalysis ? ? ?Pertinent Imaging: ?CLINICAL DATA:  Indeterminate left renal mass on ultrasound. ?Previous hysterectomy. ?  ?EXAM: ?MRI ABDOMEN WITHOUT AND WITH CONTRAST ?  ?TECHNIQUE: ?Multiplanar multisequence MR imaging of the abdomen was performed ?both before and after the administration of intravenous contrast. ?  ?CONTRAST:  47m MULTIHANCE GADOBENATE DIMEGLUMINE 529 MG/ML IV SOLN ?  ?COMPARISON:  Abdominal ultrasound 09/10/2021 ?  ?FINDINGS: ?Lower chest:  The visualized lower chest appears unremarkable. ?  ?Hepatobiliary: The liver appears normal, without focal abnormality ?or abnormal enhancement. No evidence of gallstones, gallbladder wall ?thickening or biliary dilatation. ?  ?Pancreas: Unremarkable. No pancreatic ductal dilatation or ?surrounding inflammatory changes. ?  ?Spleen: Normal in size without focal abnormality. ?  ?Adrenals/Urinary Tract: Both adrenal glands appear normal. There is ?a simple cyst in the upper pole of the right kidney, measuring 2.5 x ?2.3 cm. The left kidney appears normal on all pulse sequences, ?demonstrating no restricted diffusion or abnormal enhancement. The ?suggested left renal mass on prior ultrasound appears to be ?secondary to pseudotumor. No hydronephrosis or definite collecting ?system duplication. ?   ?Stomach/Bowel: The stomach appears unremarkable for its degree of ?distension. No evidence of bowel wall thickening, distention or ?surrounding inflammatory change. ?  ?Vascular/Lymphatic: There are no enlarged abdominal lymph nodes. No ?significant vascular findings. ?  ?Other: No evidence of abdominal wall hernia or ascites. ?  ?Musculoskeletal: No acute or significant osseous findings. Mild ?lumbar facet arthropathy. ?  ?IMPRESSION: ?1. The left kidney appears normal, without evidence of mass lesion. ?2. Simple cyst in the upper pole the right kidney. ?3. Otherwise unremarkable examination. ?  ?  ?Electronically Signed ?  By: WRichardean SaleM.D. ?  On: 10/04/2021 11:08 ? ?I have personally reviewed the images and agree with radiologist interpretation.  ? ? ? ? ?Assessment & Plan:   ? ? ?No follow-ups on file. ? ?I,Kailey Littlejohn,acting as a scribe for AHollice Espy MD.,have documented all relevant documentation on the behalf of AHollice Espy MD,as directed by  ACaryl Pina  Erlene Quan, MD while in the presence of Hollice Espy, MD. ? ? ?[facility] ?[address], [address] ?[place], [place] [address] ?[phone] ?

## 2021-12-30 ENCOUNTER — Ambulatory Visit: Payer: Managed Care, Other (non HMO) | Admitting: Urology

## 2022-01-14 ENCOUNTER — Other Ambulatory Visit: Payer: Self-pay | Admitting: Nurse Practitioner

## 2022-01-14 DIAGNOSIS — M545 Low back pain, unspecified: Secondary | ICD-10-CM

## 2022-01-14 DIAGNOSIS — J4520 Mild intermittent asthma, uncomplicated: Secondary | ICD-10-CM

## 2022-01-14 DIAGNOSIS — B001 Herpesviral vesicular dermatitis: Secondary | ICD-10-CM

## 2022-01-18 ENCOUNTER — Telehealth (INDEPENDENT_AMBULATORY_CARE_PROVIDER_SITE_OTHER): Payer: Managed Care, Other (non HMO)

## 2022-01-18 DIAGNOSIS — R102 Pelvic and perineal pain: Secondary | ICD-10-CM

## 2022-01-18 NOTE — Progress Notes (Signed)
GYNECOLOGY VIRTUAL VISIT ENCOUNTER NOTE  Provider location: [facility]   Patient location: Home  I connected with Kimberly Armstrong on 01/18/22 at  8:35 AM EDT by MyChart Video Encounter and verified that I am speaking with the correct person using two identifiers.   I discussed the limitations, risks, security and privacy concerns of performing an evaluation and management service virtually and the availability of in person appointments. I also discussed with the patient that there may be a patient responsible charge related to this service. The patient expressed understanding and agreed to proceed.   History:  Kimberly Armstrong is a 41 y.o. [phone] female being evaluated today for ongoing pelvic pain. She reports that she is still having lower abdominal pain intermittently 2-3x per month. She feels as if the pain is behind her c-section scar. She has been seeing GI and following their recommendations. She has been keeping a report on food intake as well as BM habits. She reports that she noticed and improvement in symptoms when she reduced soda intake and started taking metamucil, however pain has continued. Aggravating factors are sporadic. She reports that pain is sometimes aggravated by urination, having BM, or with intercourse, but not always. Ibuprofen and warm baths also help occasionally. She denies vaginal discharge, itching, odor, or urinary s/s. She is s/p hysterectomy.        Past Medical History:  Diagnosis Date   Asthma    Past Surgical History:  Procedure Laterality Date   ABDOMINAL HYSTERECTOMY N/A    [facility] 11/30/2020   BACK SURGERY     carpel tunnel     CESAREAN SECTION     CESAREAN SECTION N/A    [facility] 11/30/2020   EYE SURGERY N/A    [facility] 11/30/2020   PARTIAL HYSTERECTOMY     SPINE SURGERY N/A    [facility] 11/30/2020   TUBAL LIGATION N/A    [facility] 11/30/2020   The following portions of the patient's  history were reviewed and updated as appropriate: allergies, current medications, past family history, past medical history, past social history, past surgical history and problem list.   Health Maintenance:  Normal pap and negative HRHPV on 03/05/2021.  Normal mammogram on 04/14/2021.   Review of Systems:  Pertinent items noted in HPI and remainder of comprehensive ROS otherwise negative.  Physical Exam:   General:  Alert, oriented and cooperative. Patient appears to be in no acute distress.  Mental Status: Normal mood and affect. Normal behavior. Normal judgment and thought content.   Respiratory: Normal respiratory effort, no problems with respiration noted  Rest of physical exam deferred due to type of encounter  Labs and Imaging No results found for this or any previous visit (from the past 336 hour(s)). No results found.     Assessment and Plan:     1. Pelvic pain in female - Patient to continue to follow GI recommendations and follow up with them on 6/6 - She has a urology appointment on 5/24 which she was instructed to keep  - I instructed her to continue taking Ibuprofen and taking warm baths as they do seem to help symptoms - I do recommend that patient return for in person office visit once she has seen urology and GI, especially if symptoms do not seem to be related to etiologies       I discussed the assessment and treatment plan with the patient. The patient was provided an opportunity to ask questions  and all were answered. The patient agreed with the plan and demonstrated an understanding of the instructions.   The patient was advised to follow up for in person visit in 2 months for worsening symptoms  I provided 20 minutes of face-to-face time during this encounter.   Renee Harder, [place] for [employer], [place]

## 2022-01-18 NOTE — Progress Notes (Signed)
S/w pt for virtual visit. Pt is following from visit on 11/17/21 related to pelvic pain. Pt states that she has a f/u with Gertie Fey but she is still having pain.

## 2022-01-20 ENCOUNTER — Encounter: Payer: Self-pay | Admitting: Urology

## 2022-01-20 ENCOUNTER — Ambulatory Visit (INDEPENDENT_AMBULATORY_CARE_PROVIDER_SITE_OTHER): Payer: Managed Care, Other (non HMO) | Admitting: Urology

## 2022-01-20 VITALS — BP 158/103 | HR 78 | Ht 69.0 in | Wt 228.0 lb

## 2022-01-20 DIAGNOSIS — N281 Cyst of kidney, acquired: Secondary | ICD-10-CM

## 2022-01-20 DIAGNOSIS — R339 Retention of urine, unspecified: Secondary | ICD-10-CM | POA: Diagnosis not present

## 2022-01-20 DIAGNOSIS — N2889 Other specified disorders of kidney and ureter: Secondary | ICD-10-CM

## 2022-01-20 LAB — URINALYSIS, COMPLETE
Bilirubin, UA: NEGATIVE
Glucose, UA: NEGATIVE
Ketones, UA: NEGATIVE
Leukocytes,UA: NEGATIVE
Nitrite, UA: NEGATIVE
Protein,UA: NEGATIVE
Specific Gravity, UA: 1.015 (ref 1.005–1.030)
Urobilinogen, Ur: 0.2 mg/dL (ref 0.2–1.0)
pH, UA: 6.5 (ref 5.0–7.5)

## 2022-01-20 LAB — MICROSCOPIC EXAMINATION: Bacteria, UA: NONE SEEN

## 2022-01-20 LAB — BLADDER SCAN AMB NON-IMAGING: Scan Result: 270

## 2022-01-20 NOTE — Progress Notes (Signed)
01/20/2022 8:41 AM   Margreta Journey Anders Grant Vancamp 03/09/1981 [id number]  Referring provider:  Ronnell Freshwater, NP [place],  [place] [address] Chief Complaint  Patient presents with   Renal Mass      HPI: Kimberly Armstrong is a 41 y.o.female who presents today for further evaluation of renal mass.   She was seen by her PCP, Leretha Pol, NP, on 09/07/2021. She was noted to have abdominal pain when it is time to have a bowel movement. This pain had been ongoing for years. She underwent an abdominal ultrasound on 09/10/2021 to further evaluate it viauslized an interpolar 5.7 cm indeterminate mass.  To monitor the indeterminate mass she underwent an MRI of abdomen on 10/01/2021. It visualized a simple cyst in the upper pole of the right kidney, measuring 2.5 x 2.3 cm. The left kidney appears normal on all pulse sequences, demonstrating no restricted diffusion or abnormal enhancement. 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 No hydronephrosis or definite collecting system duplication.  She reports that for atleast 6 months she has been feeling like she is not emptying fully when she leans forward she is able to finish voiding.    She has 3 kids via cesarean section.  She also reports that she had a partial hysterectomy.  She is chronic pelvic pain, has been evaluated by GI as well as GYN for this.  In addition to the incomplete bladder emptying, she feels like she at times will get very bloated and not be able to completely evacuate her bowels.  She also mentions today, after intercourse, she generally voids shortly thereafter in order to avoid infection, but will void again twice more, smaller volumes.  She wonders if this is normal.    PMH: Past Medical History:  Diagnosis Date   Asthma     Surgical History: Past Surgical History:  Procedure Laterality Date   ABDOMINAL HYSTERECTOMY N/A    Phreesia  11/30/2020   BACK SURGERY     carpel tunnel     CESAREAN SECTION     CESAREAN SECTION N/A    Phreesia 11/30/2020   EYE SURGERY N/A    Phreesia 11/30/2020   PARTIAL HYSTERECTOMY     SPINE SURGERY N/A    Phreesia 11/30/2020   TUBAL LIGATION N/A    Phreesia 11/30/2020    Home Medications:  Allergies as of 01/20/2022   No Known Allergies      Medication List        Accurate as of Jan 20, 2022  8:41 AM. If you have any questions, ask your nurse or doctor.          baclofen 10 MG tablet Commonly known as: LIORESAL TAKE 1 TABLET BY MOUTH THREE TIMES A DAY   budesonide-formoterol 160-4.5 MCG/ACT inhaler Commonly known as: SYMBICORT Inhale 2 puffs into the lungs 2 (two) times daily.   cetirizine 10 MG tablet Commonly known as: ZYRTEC Take 1 tablet (10 mg total) by mouth daily.   diclofenac 75 MG EC tablet Commonly known as: VOLTAREN TAKE 1 TABLET BY MOUTH TWICE A DAY   dicyclomine 10 MG capsule Commonly known as: BENTYL Take 1 capsule (10 mg total) by mouth in the morning and at bedtime. As needed   fluticasone 50 MCG/ACT nasal spray Commonly known as: FLONASE SPRAY 2 SPRAYS INTO EACH NOSTRIL EVERY DAY   ibuprofen 600 MG tablet Commonly known as: ADVIL Take 1 tablet (600  mg total) by mouth every 8 (eight) hours as needed.   montelukast 10 MG tablet Commonly known as: SINGULAIR Take 1 tablet (10 mg total) by mouth at bedtime.   valACYclovir 500 MG tablet Commonly known as: VALTREX TAKE 2 TABLETS AT ONSET OF COLD SORE, THEN 1 TABLET TWICE DAILY UNTIL RESOLVED REPEAT AS NEEDED   Ventolin HFA 108 (90 Base) MCG/ACT inhaler Generic drug: albuterol TAKE 2 PUFFS BY MOUTH EVERY 6 HOURS AS NEEDED FOR WHEEZE OR SHORTNESS OF BREATH        Allergies:  No Known Allergies  Family History: Family History  Problem Relation Age of Onset   Breast cancer Mother    Heart attack Mother    Hypertension Mother    Diabetes Sister    Breast cancer Maternal Grandmother     Dementia Paternal Grandmother    Colon cancer Neg Hx    Esophageal cancer Neg Hx    Stomach cancer Neg Hx    Colon polyps Neg Hx     Social History:  reports that she has been smoking cigarettes. She started smoking about 21 years ago. She has been smoking an average of 1 pack per day. She has never used smokeless tobacco. She reports current alcohol use of about 2.0 standard drinks per week. She reports that she does not use drugs.   Physical Exam: BP (!) 158/103   Pulse 78   Ht '5\' 9"'$  (1.753 m)   Wt 228 lb (103.4 kg)   BMI 33.67 kg/m   Constitutional:  Alert and oriented, No acute distress. HEENT: [place] AT, moist mucus membranes.  Trachea midline, no masses. Cardiovascular: No clubbing, cyanosis, or edema. Respiratory: Normal respiratory effort, no increased work of breathing. Skin: No rashes, bruises or suspicious lesions. Neurologic: Grossly intact, no focal deficits, moving all 4 extremities. Psychiatric: Normal mood and affect.  Laboratory Data:  Lab Results  Component Value Date   CREATININE 0.82 12/10/2020   Lab Results  Component Value Date   HGBA1C 5.6 12/10/2020    Urinalysis Unremarkable  Pertinent Imaging: CLINICAL DATA:  Generalized abdominal pain   EXAM: ABDOMEN ULTRASOUND COMPLETE   COMPARISON:  None.   FINDINGS: Gallbladder: No gallstones or wall thickening visualized. No sonographic Murphy sign noted by sonographer.   Common bile duct: Diameter: 0.4 cm   Liver: No focal lesion identified. Within normal limits in parenchymal echogenicity. Portal vein is patent on color Doppler imaging with normal direction of blood flow towards the liver.   IVC: No abnormality visualized.   Pancreas: Visualized portion unremarkable.   Spleen: Size and appearance within normal limits.   Right Kidney: Length: 13.1 cm. Normal echogenicity. No hydronephrosis. There is a 2.7 cm anechoic lesion in the upper pole compatible with a simple renal cyst.   Left  Kidney: Length: 13.1 cm. In the mid left kidney, there is a 5.7 x 5.1 x 4.2 cm mass that is isoechoic to slightly hyperechoic relative to the renal cortex.   Abdominal aorta: No aneurysm visualized.   Other findings: None.   IMPRESSION: In the left kidney there is an interpolar 5.7 cm indeterminate mass. Further characterization with CT or MRI renal protocol is recommended.   These results will be called to the ordering clinician or representative by the Radiologist Assistant, and communication documented in the PACS or [employer].     Electronically Signed   By: Albin Felling M.D.   On: 09/10/2021 10:18   CLINICAL DATA:  Indeterminate left renal mass on  ultrasound. Previous hysterectomy.   EXAM: MRI ABDOMEN WITHOUT AND WITH CONTRAST   TECHNIQUE: Multiplanar multisequence MR imaging of the abdomen was performed both before and after the administration of intravenous contrast.   CONTRAST:  52m MULTIHANCE GADOBENATE DIMEGLUMINE 529 MG/ML IV SOLN   COMPARISON:  Abdominal ultrasound 09/10/2021   FINDINGS: Lower chest:  The visualized lower chest appears unremarkable.   Hepatobiliary: The liver appears normal, without focal abnormality or abnormal enhancement. No evidence of gallstones, gallbladder wall thickening or biliary dilatation.   Pancreas: Unremarkable. No pancreatic ductal dilatation or surrounding inflammatory changes.   Spleen: Normal in size without focal abnormality.   Adrenals/Urinary Tract: Both adrenal glands appear normal. There is a simple cyst in the upper pole of the right kidney, measuring 2.5 x 2.3 cm. The left kidney appears normal on all pulse sequences, demonstrating no restricted diffusion or abnormal enhancement. 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 No hydronephrosis or definite collecting system duplication.   Stomach/Bowel: The stomach appears unremarkable for its degree  of distension. No evidence of bowel wall thickening, distention or surrounding inflammatory change.   Vascular/Lymphatic: There are no enlarged abdominal lymph nodes. No significant vascular findings.   Other: No evidence of abdominal wall hernia or ascites.   Musculoskeletal: No acute or significant osseous findings. Mild lumbar facet arthropathy.   IMPRESSION: 1. The left kidney appears normal, without evidence of mass lesion. 2. Simple cyst in the upper pole the right kidney. 3. Otherwise unremarkable examination.     Electronically Signed   By: WRichardean SaleM.D.   On: 10/04/2021 11:08    I have personally reviewed the images and agree with radiologist interpretation.   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 Renal mass  - Not concerning for malignancy does not warrant any further work-up.   3.  Renal cyst -Benign, asymptomatic, would not recommend any further surveillance or intervention  F/u prn  I,Kailey Littlejohn,acting as a scribe for AHollice Espy MD.,have documented all relevant documentation on the behalf of AHollice Espy MD,as directed by  AHollice Espy MD while in the presence of AHollice Espy MD.  I have reviewed the above documentation for accuracy and completeness, and I agree with the above.    AHollice Espy MD    [facility] [address] [place] [phone]

## 2022-01-29 ENCOUNTER — Ambulatory Visit: Payer: Managed Care, Other (non HMO) | Admitting: Nurse Practitioner

## 2022-02-23 ENCOUNTER — Telehealth: Payer: Managed Care, Other (non HMO) | Admitting: Physician Assistant

## 2022-02-23 DIAGNOSIS — N390 Urinary tract infection, site not specified: Secondary | ICD-10-CM

## 2022-02-24 ENCOUNTER — Ambulatory Visit
Admission: RE | Admit: 2022-02-24 | Discharge: 2022-02-24 | Disposition: A | Discharge status: Home / Self Care | Payer: Managed Care, Other (non HMO) | Source: Ambulatory Visit | Attending: Family Medicine | Admitting: Family Medicine

## 2022-02-24 VITALS — BP 151/99 | HR 67 | Temp 98.2°F | Resp 18

## 2022-02-24 DIAGNOSIS — R109 Unspecified abdominal pain: Secondary | ICD-10-CM | POA: Insufficient documentation

## 2022-02-24 DIAGNOSIS — J4541 Moderate persistent asthma with (acute) exacerbation: Secondary | ICD-10-CM | POA: Insufficient documentation

## 2022-02-24 LAB — POCT URINALYSIS DIP (MANUAL ENTRY)
Bilirubin, UA: NEGATIVE
Glucose, UA: NEGATIVE mg/dL
Ketones, POC UA: NEGATIVE mg/dL
Nitrite, UA: NEGATIVE
Protein Ur, POC: NEGATIVE mg/dL
Spec Grav, UA: <=1.005 — AB (ref 1.010–1.025)
Urobilinogen, UA: 0.2 E.U./dL
pH, UA: 5.5 (ref 5.0–8.0)

## 2022-02-24 MED ORDER — CIPROFLOXACIN HCL 500 MG PO TABS: 500.0000 mg | ORAL_TABLET | Freq: Two times a day (BID) | ORAL | 0 refills | Status: AC

## 2022-02-24 MED ORDER — PREDNISONE 20 MG PO TABS: 40.0000 mg | ORAL_TABLET | Freq: Every day | ORAL | 0 refills | Status: AC

## 2022-02-24 NOTE — ED Provider Notes (Signed)
[name]    [name]: [id number] Arrival date & time: 02/24/22  0900      History   Chief Complaint Chief Complaint  Patient presents with   UTI    HPI Kimberly Armstrong is a 41 y.o. female.   HPI Here for malodorous urine and more urinary urgency in the last 3 to 4 days.  She is also got some new right flank pain.  She has maybe had some chills or nausea some.  No dysuria.  She does often have urinary urge and has seen a urologist.  The urologist stated that she was not completely emptying her bladder   She also has a history of asthma for which she is on Symbicort and Singulair.  Her wheezing has been increased in the last few days.  No new nasal congestion or sore throat or fever otherwise  She has had a hysterectomy.  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   Tobacco use disorder, continuous 10/05/2021   Raynaud's phenomenon without gangrene 10/05/2021   Generalized abdominal tenderness without rebound tenderness 09/07/2021   Seasonal allergies 09/07/2021   Acute left-sided low back pain without sciatica 09/07/2021   Cold sore 09/07/2021   Encounter for well woman exam with routine gynecological exam 03/15/2021   Neck muscle spasm 03/15/2021   Mild intermittent asthma without complication 20/94/7096   Body mass index (BMI) of 33.0-33.9 in adult 03/15/2021   Screening for STD (sexually transmitted disease) 03/15/2021   Need for Tdap vaccination 03/15/2021   Encounter to establish care 12/03/2020   Chronic midline low back pain with bilateral sciatica 12/03/2020   Moderate cigarette smoker 12/03/2020   Encounter for screening mammogram for breast cancer 12/03/2020   Asthma 06-18-1981    Past Surgical History:  Procedure  Laterality Date   ABDOMINAL HYSTERECTOMY N/A    [facility]ia 11/30/2020   BACK SURGERY     carpel tunnel     CESAREAN SECTION     CESAREAN SECTION N/A    [facility]reesia 11/30/2020   EYE SURGERY N/A    [facility]reesia 11/30/2020   PARTIAL HYSTERECTOMY     SPINE SURGERY N/A    [facility]reesia 11/30/2020   TUBAL LIGATION N/A    Phreesia 11/30/2020    OB History     Gravida  4   Para  3   Term  3   Preterm  0   AB  1   Living  3      SAB  1   IAB  0   Ectopic  0   Multiple  0   Live Births  3            Home Medications    Prior to Admission medications   Medication Sig Start Date End Date Taking? Authorizing Provider  ciprofloxacin (CIPRO) 500 MG tablet Take 1 tablet (500 mg total) by mouth 2 (two) times daily for 7 days. 02/24/22 03/03/22 Yes Lithzy Bernard, Gwenlyn Perking, MD  predniSONE (DELTASONE) 20 MG tablet Take 2 tablets (40 mg total) by mouth daily with breakfast for 3 days. 02/24/22 02/27/22 Yes Barrett Henle, MD  baclofen (LIORESAL) 10 MG tablet TAKE 1 TABLET BY MOUTH THREE TIMES A DAY 12/14/21   Ronnell Freshwater,  NP  budesonide-formoterol (SYMBICORT) 160-4.5 MCG/ACT inhaler Inhale 2 puffs into the lungs 2 (two) times daily. 09/07/21   Ronnell Freshwater, NP  cetirizine (ZYRTEC) 10 MG tablet Take 1 tablet (10 mg total) by mouth daily. 09/07/21   Ronnell Freshwater, NP  diclofenac (VOLTAREN) 75 MG EC tablet TAKE 1 TABLET BY MOUTH TWICE A DAY 01/14/22   Ronnell Freshwater, NP  dicyclomine (BENTYL) 10 MG capsule Take 1 capsule (10 mg total) by mouth in the morning and at bedtime. As needed 12/15/21   Mauri Pole, MD  fluticasone (FLONASE) 50 MCG/ACT nasal spray SPRAY 2 SPRAYS INTO EACH NOSTRIL EVERY DAY 09/07/21   Ronnell Freshwater, NP  ibuprofen (ADVIL) 600 MG tablet Take 1 tablet (600 mg total) by mouth every 8 (eight) hours as needed. 11/13/21   Tempie Hoist, FNP  montelukast (SINGULAIR) 10 MG tablet Take 1 tablet (10 mg total) by mouth at bedtime. 09/07/21   Ronnell Freshwater, NP   VENTOLIN HFA 108 (90 Base) MCG/ACT inhaler TAKE 2 PUFFS BY MOUTH EVERY 6 HOURS AS NEEDED FOR WHEEZE OR SHORTNESS OF BREATH 01/14/22   Ronnell Freshwater, NP    Family History Family History  Problem Relation Age of Onset   Breast cancer Mother    Heart attack Mother    Hypertension Mother    Diabetes Sister    Breast cancer Maternal Grandmother    Dementia Paternal Grandmother    Colon cancer Neg Hx    Esophageal cancer Neg Hx    Stomach cancer Neg Hx    Colon polyps Neg Hx     Social History Social History   Tobacco Use   Smoking status: Every Day    Packs/day: 1.00    Types: Cigarettes    Start date: 12/03/2000   Smokeless tobacco: Never  Vaping Use   Vaping Use: Never used  Substance Use Topics   Alcohol use: Yes    Alcohol/week: 2.0 standard drinks of alcohol    Types: 2 Glasses of wine per week   Drug use: Never     Allergies   Patient has no known allergies.   Review of Systems Review of Systems   Physical Exam Triage Vital Signs ED Triage Vitals  Enc Vitals Group     BP 02/24/22 0955 (!) 151/99     Pulse Rate 02/24/22 0955 67     Resp 02/24/22 0955 18     Temp 02/24/22 0955 98.2 F (36.8 C)     Temp Source 02/24/22 0955 Oral     SpO2 02/24/22 0955 99 %     Weight --      Height --      Head Circumference --      Peak Flow --      Pain Score 02/24/22 0945 5     Pain Loc --      Pain Edu? --      Excl. in [place]? --    No data found.  Updated Vital Signs BP (!) 151/99 (BP Location: Left Arm)   Pulse 67   Temp 98.2 F (36.8 C) (Oral)   Resp 18   SpO2 99%   Visual Acuity Right Eye Distance:   Left Eye Distance:   Bilateral Distance:    Right Eye Near:   Left Eye Near:    Bilateral Near:     Physical Exam Vitals reviewed.  Constitutional:      General: She is not in acute distress.  Appearance: She is not toxic-appearing.  HENT:     Nose: Nose normal.     Mouth/Throat:     Mouth: Mucous membranes are moist.     Pharynx: No  oropharyngeal exudate or posterior oropharyngeal erythema.  Eyes:     Extraocular Movements: Extraocular movements intact.     Conjunctiva/sclera: Conjunctivae normal.     Pupils: Pupils are equal, round, and reactive to light.  Cardiovascular:     Rate and Rhythm: Normal rate and regular rhythm.     Heart sounds: No murmur heard. Pulmonary:     Effort: Pulmonary effort is normal. No respiratory distress.     Breath sounds: No wheezing, rhonchi or rales.  Chest:     Chest wall: No tenderness.  Abdominal:     Palpations: Abdomen is soft.     Tenderness: There is abdominal tenderness (suprapubic). There is right CVA tenderness.  Musculoskeletal:     Cervical back: Neck supple.  Lymphadenopathy:     Cervical: No cervical adenopathy.  Skin:    Capillary Refill: Capillary refill takes less than 2 seconds.     Coloration: Skin is not jaundiced or pale.  Neurological:     General: No focal deficit present.     Mental Status: She is alert and oriented to person, place, and time.  Psychiatric:        Behavior: Behavior normal.      UC Treatments / Results  Labs (all labs ordered are listed, but only abnormal results are displayed) Labs Reviewed  POCT URINALYSIS DIP (MANUAL ENTRY) - Abnormal; Notable for the following components:      Result Value   Spec Grav, UA <=1.005 (*)    Blood, UA moderate (*)    Leukocytes, UA Moderate (2+) (*)    All other components within normal limits  URINE CULTURE    EKG   Radiology No results found.  Procedures Procedures (including critical care time)  Medications Ordered in UC Medications - No data to display  Initial Impression / Assessment and Plan / UC Course  I have reviewed the triage vital signs and the nursing notes.  Pertinent labs & imaging results that were available during my care of the patient were reviewed by me and considered in my medical decision making (see chart for details).     UA shows pyuria and blood  cells.  I am going to treat for UTI and culture the urine.  Since she may be having an asthma exacerbation, I am also going to send in 3 days of prednisone Final Clinical Impressions(s) / UC Diagnoses   Final diagnoses:  Flank pain  Moderate persistent asthma with (acute) exacerbation     Discharge Instructions      The urinalysis shows white blood cells and red blood cells.  Take Cipro 500 mg--1 tablet 2 times daily for 7 days.  This is your antibiotic  Prednisone 20 mg--2 daily for 3 days.  This is for inflammation in your lungs  Urine culture is sent, and staff will call you if it looks like you need a different antibiotic     ED Prescriptions     Medication Sig Dispense Auth. Provider   ciprofloxacin (CIPRO) 500 MG tablet Take 1 tablet (500 mg total) by mouth 2 (two) times daily for 7 days. 14 tablet Aurora Rody, Gwenlyn Perking, MD   predniSONE (DELTASONE) 20 MG tablet Take 2 tablets (40 mg total) by mouth daily with breakfast for 3 days. 6 tablet Juliane Poot  K, MD      PDMP not reviewed this encounter.   Barrett Henle, MD 02/24/22 1007

## 2022-02-24 NOTE — Discharge Instructions (Addendum)
The urinalysis shows white blood cells and red blood cells.  Take Cipro 500 mg--1 tablet 2 times daily for 7 days.  This is your antibiotic  Prednisone 20 mg--2 daily for 3 days.  This is for inflammation in your lungs  Urine culture is sent, and staff will call you if it looks like you need a different antibiotic

## 2022-02-24 NOTE — ED Triage Notes (Signed)
Pt presents with urinary urgency and right side flank pain since yesterday.

## 2022-02-26 LAB — URINE CULTURE: Culture: 60000 — AB

## 2022-03-16 ENCOUNTER — Ambulatory Visit: Payer: Managed Care, Other (non HMO) | Admitting: Nurse Practitioner

## 2022-03-21 ENCOUNTER — Encounter (HOSPITAL_BASED_OUTPATIENT_CLINIC_OR_DEPARTMENT_OTHER): Payer: Self-pay | Admitting: Emergency Medicine

## 2022-03-21 ENCOUNTER — Other Ambulatory Visit: Payer: Self-pay

## 2022-03-21 DIAGNOSIS — R7309 Other abnormal glucose: Secondary | ICD-10-CM | POA: Insufficient documentation

## 2022-03-21 DIAGNOSIS — R1032 Left lower quadrant pain: Secondary | ICD-10-CM | POA: Insufficient documentation

## 2022-03-21 DIAGNOSIS — R1031 Right lower quadrant pain: Secondary | ICD-10-CM | POA: Diagnosis present

## 2022-03-21 NOTE — ED Triage Notes (Signed)
Pt states lower abd pain since ~1800. Reports some nausea earlier but no other sx. Hx partial hysterectomy. Pt reports having this pain "for years" but that its coming more frequently and more intense. Pt has had imaging for same without diagnosis.

## 2022-03-22 ENCOUNTER — Emergency Department (HOSPITAL_BASED_OUTPATIENT_CLINIC_OR_DEPARTMENT_OTHER)
Admission: EM | Admit: 2022-03-22 | Discharge: 2022-03-22 | Disposition: A | Discharge status: Home / Self Care | Payer: Managed Care, Other (non HMO) | Attending: Emergency Medicine | Admitting: Emergency Medicine

## 2022-03-22 ENCOUNTER — Emergency Department (HOSPITAL_BASED_OUTPATIENT_CLINIC_OR_DEPARTMENT_OTHER): Payer: Managed Care, Other (non HMO)

## 2022-03-22 DIAGNOSIS — R1031 Right lower quadrant pain: Secondary | ICD-10-CM | POA: Diagnosis not present

## 2022-03-22 DIAGNOSIS — R109 Unspecified abdominal pain: Secondary | ICD-10-CM

## 2022-03-22 LAB — COMPREHENSIVE METABOLIC PANEL
ALT: 23 U/L (ref 0–44)
AST: 20 U/L (ref 15–41)
Albumin: 4 g/dL (ref 3.5–5.0)
Alkaline Phosphatase: 64 U/L (ref 38–126)
Anion gap: 6 (ref 5–15)
BUN: 8 mg/dL (ref 6–20)
CO2: 25 mmol/L (ref 22–32)
Calcium: 8.8 mg/dL — ABNORMAL LOW (ref 8.9–10.3)
Chloride: 107 mmol/L (ref 98–111)
Creatinine, Ser: 0.84 mg/dL (ref 0.44–1.00)
GFR, Estimated: >60 mL/min (ref >60)
Glucose, Bld: 128 mg/dL — ABNORMAL HIGH (ref 70–99)
Potassium: 3.5 mmol/L (ref 3.5–5.1)
Sodium: 138 mmol/L (ref 135–145)
Total Bilirubin: 0.7 mg/dL (ref 0.3–1.2)
Total Protein: 7.1 g/dL (ref 6.5–8.1)

## 2022-03-22 LAB — CBC
HCT: 46.6 % — ABNORMAL HIGH (ref 36.0–46.0)
Hemoglobin: 15.9 g/dL — ABNORMAL HIGH (ref 12.0–15.0)
MCH: 30.4 pg (ref 26.0–34.0)
MCHC: 34.1 g/dL (ref 30.0–36.0)
MCV: 89.1 fL (ref 80.0–100.0)
Platelets: 196 10*3/uL (ref 150–400)
RBC: 5.23 MIL/uL — ABNORMAL HIGH (ref 3.87–5.11)
RDW: 11.9 % (ref 11.5–15.5)
WBC: 6.6 10*3/uL (ref 4.0–10.5)
nRBC: 0 % (ref 0.0–0.2)

## 2022-03-22 LAB — URINALYSIS, ROUTINE W REFLEX MICROSCOPIC
Bilirubin Urine: NEGATIVE
Glucose, UA: NEGATIVE mg/dL
Ketones, ur: NEGATIVE mg/dL
Leukocytes,Ua: NEGATIVE
Nitrite: NEGATIVE
Protein, ur: NEGATIVE mg/dL
Specific Gravity, Urine: 1.01 (ref 1.005–1.030)
pH: 5.5 (ref 5.0–8.0)

## 2022-03-22 LAB — PREGNANCY, URINE: Preg Test, Ur: NEGATIVE

## 2022-03-22 LAB — URINALYSIS, MICROSCOPIC (REFLEX)

## 2022-03-22 LAB — LIPASE, BLOOD: Lipase: 50 U/L (ref 11–51)

## 2022-03-22 MED ORDER — DICYCLOMINE HCL 10 MG PO CAPS
10.0000 mg | ORAL_CAPSULE | Freq: Once | ORAL | Status: AC
  Administered 2022-03-22: 10 mg via ORAL
  Filled 2022-03-22: qty 1

## 2022-03-22 MED ORDER — KETOROLAC TROMETHAMINE 60 MG/2ML IM SOLN
60.0000 mg | Freq: Once | INTRAMUSCULAR | Status: AC
  Administered 2022-03-22: 60 mg via INTRAMUSCULAR
  Filled 2022-03-22: qty 2

## 2022-03-22 MED ORDER — DICYCLOMINE HCL 20 MG PO TABS: 20.0000 mg | ORAL_TABLET | Freq: Two times a day (BID) | ORAL | 0 refills | Status: DC

## 2022-03-22 NOTE — ED Provider Notes (Signed)
[facility] Provider Note   [name]: [id number] Arrival date & time: 03/21/22  2255     History  No chief complaint on file.   Kimberly Armstrong is a 41 y.o. female.  The history is provided by the patient.  Abdominal Pain Pain location:  Suprapubic, LLQ and RLQ Pain quality: aching   Pain radiates to:  Does not radiate Pain severity:  Moderate Onset quality:  Gradual Duration:  104 weeks Timing:  Constant Progression:  Worsening Chronicity:  Chronic Context: not recent travel and not trauma   Relieved by:  Nothing Worsened by:  Nothing Ineffective treatments: seeing GI and GYN. Associated symptoms: no chest pain, no constipation, no cough, no dysuria, no fever, no nausea, no shortness of breath, no vaginal bleeding and no vomiting   Risk factors: not pregnant        Home Medications Prior to Admission medications   Medication Sig Start Date End Date Taking? Authorizing Provider  dicyclomine (BENTYL) 20 MG tablet Take 1 tablet (20 mg total) by mouth 2 (two) times daily. 03/22/22  Yes Keeana Pieratt, MD  baclofen (LIORESAL) 10 MG tablet TAKE 1 TABLET BY MOUTH THREE TIMES A DAY 12/14/21   Ronnell Freshwater, NP  budesonide-formoterol (SYMBICORT) 160-4.5 MCG/ACT inhaler Inhale 2 puffs into the lungs 2 (two) times daily. 09/07/21   Ronnell Freshwater, NP  cetirizine (ZYRTEC) 10 MG tablet Take 1 tablet (10 mg total) by mouth daily. 09/07/21   Ronnell Freshwater, NP  diclofenac (VOLTAREN) 75 MG EC tablet TAKE 1 TABLET BY MOUTH TWICE A DAY 01/14/22   Ronnell Freshwater, NP  dicyclomine (BENTYL) 10 MG capsule Take 1 capsule (10 mg total) by mouth in the morning and at bedtime. As needed 12/15/21   Mauri Pole, MD  fluticasone (FLONASE) 50 MCG/ACT nasal spray SPRAY 2 SPRAYS INTO EACH NOSTRIL EVERY DAY 09/07/21   Ronnell Freshwater, NP  ibuprofen (ADVIL) 600 MG tablet Take 1 tablet (600 mg total) by mouth every 8 (eight) hours as needed. 11/13/21    Tempie Hoist, FNP  montelukast (SINGULAIR) 10 MG tablet Take 1 tablet (10 mg total) by mouth at bedtime. 09/07/21   Ronnell Freshwater, NP  VENTOLIN HFA 108 (90 Base) MCG/ACT inhaler TAKE 2 PUFFS BY MOUTH EVERY 6 HOURS AS NEEDED FOR WHEEZE OR SHORTNESS OF BREATH 01/14/22   Ronnell Freshwater, NP      Allergies    Patient has no known allergies.    Review of Systems   Review of Systems  Constitutional:  Negative for fever.  HENT:  Negative for facial swelling.   Eyes:  Negative for redness.  Respiratory:  Negative for cough and shortness of breath.   Cardiovascular:  Negative for chest pain.  Gastrointestinal:  Positive for abdominal pain. Negative for constipation, nausea and vomiting.  Genitourinary:  Negative for dysuria and vaginal bleeding.  Neurological:  Negative for facial asymmetry.  All other systems reviewed and are negative.   Physical Exam Updated Vital Signs BP (!) 145/88 (BP Location: Right Arm)   Pulse 66   Temp 98 F (36.7 C) (Oral)   Resp 18   SpO2 100%  Physical Exam Vitals and nursing note reviewed.  Constitutional:      General: She is not in acute distress.    Appearance: Normal appearance. She is well-developed.  HENT:     Head: Normocephalic and atraumatic.     Nose: Nose normal.  Eyes:  Pupils: Pupils are equal, round, and reactive to light.  Cardiovascular:     Rate and Rhythm: Normal rate and regular rhythm.     Pulses: Normal pulses.     Heart sounds: Normal heart sounds.  Pulmonary:     Effort: Pulmonary effort is normal. No respiratory distress.     Breath sounds: Normal breath sounds.  Abdominal:     General: Bowel sounds are normal.     Palpations: Abdomen is soft.     Tenderness: There is no abdominal tenderness. There is no guarding or rebound.     Hernia: No hernia is present.  Genitourinary:    Vagina: No vaginal discharge.  Musculoskeletal:        General: Normal range of motion.     Cervical back: Neck supple.  Skin:     General: Skin is warm and dry.     Capillary Refill: Capillary refill takes less than 2 seconds.     Findings: No erythema or rash.  Neurological:     General: No focal deficit present.     Mental Status: She is oriented to person, place, and time.     Deep Tendon Reflexes: Reflexes normal.  Psychiatric:        Mood and Affect: Mood normal.     ED Results / Procedures / Treatments   Labs (all labs ordered are listed, but only abnormal results are displayed) Results for orders placed or performed during the hospital encounter of 03/22/22  Pregnancy, urine  Result Value Ref Range   Preg Test, Ur NEGATIVE NEGATIVE  Urinalysis, Routine w reflex microscopic  Result Value Ref Range   Color, Urine YELLOW YELLOW   APPearance CLEAR CLEAR   Specific Gravity, Urine 1.010 1.005 - 1.030   pH 5.5 5.0 - 8.0   Glucose, UA NEGATIVE NEGATIVE mg/dL   Hgb urine dipstick TRACE (A) NEGATIVE   Bilirubin Urine NEGATIVE NEGATIVE   Ketones, ur NEGATIVE NEGATIVE mg/dL   Protein, ur NEGATIVE NEGATIVE mg/dL   Nitrite NEGATIVE NEGATIVE   Leukocytes,Ua NEGATIVE NEGATIVE  Lipase, blood  Result Value Ref Range   Lipase 50 11 - 51 U/L  Comprehensive metabolic panel  Result Value Ref Range   Sodium 138 135 - 145 mmol/L   Potassium 3.5 3.5 - 5.1 mmol/L   Chloride 107 98 - 111 mmol/L   CO2 25 22 - 32 mmol/L   Glucose, Bld 128 (H) 70 - 99 mg/dL   BUN 8 6 - 20 mg/dL   Creatinine, Ser 0.84 0.44 - 1.00 mg/dL   Calcium 8.8 (L) 8.9 - 10.3 mg/dL   Total Protein 7.1 6.5 - 8.1 g/dL   Albumin 4.0 3.5 - 5.0 g/dL   AST 20 15 - 41 U/L   ALT 23 0 - 44 U/L   Alkaline Phosphatase 64 38 - 126 U/L   Total Bilirubin 0.7 0.3 - 1.2 mg/dL   GFR, Estimated >60 >60 mL/min   Anion gap 6 5 - 15  CBC  Result Value Ref Range   WBC 6.6 4.0 - 10.5 K/uL   RBC 5.23 (H) 3.87 - 5.11 MIL/uL   Hemoglobin 15.9 (H) 12.0 - 15.0 g/dL   HCT 46.6 (H) 36.0 - 46.0 %   MCV 89.1 80.0 - 100.0 fL   MCH 30.4 26.0 - 34.0 pg   MCHC 34.1 30.0  - 36.0 g/dL   RDW 11.9 11.5 - 15.5 %   Platelets 196 150 - 400 K/uL   nRBC 0.0 0.0 -  0.2 %  Urinalysis, Microscopic (reflex)  Result Value Ref Range   RBC / HPF 0-5 0 - 5 RBC/hpf   WBC, UA 0-5 0 - 5 WBC/hpf   Bacteria, UA RARE (A) NONE SEEN   Squamous Epithelial / LPF 0-5 0 - 5   CT Renal Stone Study  Result Date: 03/22/2022 CLINICAL DATA:  Flank pain EXAM: CT ABDOMEN AND PELVIS WITHOUT CONTRAST TECHNIQUE: Multidetector CT imaging of the abdomen and pelvis was performed following the standard protocol without IV contrast. RADIATION DOSE REDUCTION: This exam was performed according to the departmental dose-optimization program which includes automated exposure control, adjustment of the mA and/or kV according to patient size and/or use of iterative reconstruction technique. COMPARISON:  None Available. FINDINGS: Lower Chest: Normal. Hepatobiliary: Normal hepatic contours. No intra- or extrahepatic biliary dilatation. The gallbladder is normal. Pancreas: Normal pancreas. No ductal dilatation or peripancreatic fluid collection. Spleen: Normal. Adrenals/Urinary Tract: The adrenal glands are normal. No hydronephrosis, nephroureterolithiasis or solid renal mass. The urinary bladder is normal for degree of distention Stomach/Bowel: There is no hiatal hernia. Normal duodenal course and caliber. No small bowel dilatation or inflammation. No focal colonic abnormality. Normal appendix. Vascular/Lymphatic: There is calcific atherosclerosis of the abdominal aorta. No lymphadenopathy. Reproductive: Status post hysterectomy. No adnexal mass. Other: None. Musculoskeletal: No bony spinal canal stenosis or focal osseous abnormality. IMPRESSION: 1. No acute abnormality of the abdomen or pelvis. 2. Aortic Atherosclerosis (ICD10-I70.0). Electronically Signed   By: Ulyses Jarred M.D.   On: 03/22/2022 02:52      Radiology CT Renal Stone Study  Result Date: 03/22/2022 CLINICAL DATA:  Flank pain EXAM: CT ABDOMEN AND  PELVIS WITHOUT CONTRAST TECHNIQUE: Multidetector CT imaging of the abdomen and pelvis was performed following the standard protocol without IV contrast. RADIATION DOSE REDUCTION: This exam was performed according to the departmental dose-optimization program which includes automated exposure control, adjustment of the mA and/or kV according to patient size and/or use of iterative reconstruction technique. COMPARISON:  None Available. FINDINGS: Lower Chest: Normal. Hepatobiliary: Normal hepatic contours. No intra- or extrahepatic biliary dilatation. The gallbladder is normal. Pancreas: Normal pancreas. No ductal dilatation or peripancreatic fluid collection. Spleen: Normal. Adrenals/Urinary Tract: The adrenal glands are normal. No hydronephrosis, nephroureterolithiasis or solid renal mass. The urinary bladder is normal for degree of distention Stomach/Bowel: There is no hiatal hernia. Normal duodenal course and caliber. No small bowel dilatation or inflammation. No focal colonic abnormality. Normal appendix. Vascular/Lymphatic: There is calcific atherosclerosis of the abdominal aorta. No lymphadenopathy. Reproductive: Status post hysterectomy. No adnexal mass. Other: None. Musculoskeletal: No bony spinal canal stenosis or focal osseous abnormality. IMPRESSION: 1. No acute abnormality of the abdomen or pelvis. 2. Aortic Atherosclerosis (ICD10-I70.0). Electronically Signed   By: Ulyses Jarred M.D.   On: 03/22/2022 02:52    Procedures Procedures    Medications Ordered in ED Medications  dicyclomine (BENTYL) capsule 10 mg (10 mg Oral Given 03/22/22 0257)  ketorolac (TORADOL) injection 60 mg (60 mg Intramuscular Given 03/22/22 0258)    ED Course/ Medical Decision Making/ A&P                           Medical Decision Making Patient with chronic lower abdominal pain that PMD, GI and GYN can not find the cause of   Problems Addressed: Abdominal pain, unspecified abdominal location:    Details: follow up  with your PMD and GI for ongoing care.  Amount and/or Complexity of Data Reviewed  External Data Reviewed: notes.    Details: previous notes reviewed Labs: ordered.    Details: No UTI by me on urine.  Normal sodium and potassium and creatinine.  glucose mildly elevated 128.  Normal white count 6.1,  hemoglobin elevated 15.9 normal platelets.  negative pregnancy test Radiology: ordered and independent interpretation performed.    Details: normal CT by me  Risk Prescription drug management. Risk Details: Well appearing, sleeping in room.  Abdomen is not surgical.  Pain is chronic in nature.  Patient is stable for discharge with close follow up.  Recommend follow up with PMD and GI.      Final Clinical Impression(s) / ED Diagnoses Final diagnoses:  Abdominal pain, unspecified abdominal location  Return for intractable cough, coughing up blood, fevers > 100.4 unrelieved by medication, shortness of breath, intractable vomiting, chest pain, shortness of breath, weakness, numbness, changes in speech, facial asymmetry, abdominal pain, passing out, Inability to tolerate liquids or food, cough, altered mental status or any concerns. No signs of systemic illness or infection. The patient is nontoxic-appearing on exam and vital signs are within normal limits.  I have reviewed the triage vital signs and the nursing notes. Pertinent labs & imaging results that were available during my care of the patient were reviewed by me and considered in my medical decision making (see chart for details). After history, exam, and medical workup I feel the patient has been appropriately medically screened and is safe for discharge home. Pertinent diagnoses were discussed with the patient. Patient was given return precautions.   Rx / DC Orders ED Discharge Orders          Ordered    dicyclomine (BENTYL) 20 MG tablet  2 times daily        03/22/22 0253              Jhanae Jaskowiak, MD 03/22/22 [id number]

## 2022-03-22 NOTE — ED Notes (Signed)
Signature pad not working. Pt verbalized understanding of instructions. No questions at this time.

## 2022-03-24 ENCOUNTER — Telehealth: Payer: Managed Care, Other (non HMO) | Admitting: Physician Assistant

## 2022-03-24 DIAGNOSIS — R102 Pelvic and perineal pain: Secondary | ICD-10-CM | POA: Diagnosis not present

## 2022-03-24 DIAGNOSIS — N3289 Other specified disorders of bladder: Secondary | ICD-10-CM

## 2022-03-24 MED ORDER — PHENAZOPYRIDINE HCL 200 MG PO TABS: 200.0000 mg | ORAL_TABLET | Freq: Three times a day (TID) | ORAL | 0 refills | Status: DC | PRN

## 2022-03-24 NOTE — Patient Instructions (Signed)
Kimberly Armstrong, thank you for joining Mar Daring, PA-C for today's virtual visit.  While this provider is not your primary care provider (PCP), if your PCP is located in our provider database this encounter information will be shared with them immediately following your visit.  Consent: (Patient) Kimberly Armstrong provided verbal consent for this virtual visit at the beginning of the encounter.  Current Medications:  Current Outpatient Medications:    phenazopyridine (PYRIDIUM) 200 MG tablet, Take 1 tablet (200 mg total) by mouth 3 (three) times daily as needed for pain., Disp: 10 tablet, Rfl: 0   baclofen (LIORESAL) 10 MG tablet, TAKE 1 TABLET BY MOUTH THREE TIMES A DAY, Disp: 90 tablet, Rfl: 1   budesonide-formoterol (SYMBICORT) 160-4.5 MCG/ACT inhaler, Inhale 2 puffs into the lungs 2 (two) times daily., Disp: 3 each, Rfl: 1   cetirizine (ZYRTEC) 10 MG tablet, Take 1 tablet (10 mg total) by mouth daily., Disp: 90 tablet, Rfl: 1   diclofenac (VOLTAREN) 75 MG EC tablet, TAKE 1 TABLET BY MOUTH TWICE A DAY, Disp: 60 tablet, Rfl: 2   dicyclomine (BENTYL) 10 MG capsule, Take 1 capsule (10 mg total) by mouth in the morning and at bedtime. As needed, Disp: 30 capsule, Rfl: 2   dicyclomine (BENTYL) 20 MG tablet, Take 1 tablet (20 mg total) by mouth 2 (two) times daily., Disp: 20 tablet, Rfl: 0   fluticasone (FLONASE) 50 MCG/ACT nasal spray, SPRAY 2 SPRAYS INTO EACH NOSTRIL EVERY DAY, Disp: 16 mL, Rfl: 3   ibuprofen (ADVIL) 600 MG tablet, Take 1 tablet (600 mg total) by mouth every 8 (eight) hours as needed., Disp: 30 tablet, Rfl: 0   montelukast (SINGULAIR) 10 MG tablet, Take 1 tablet (10 mg total) by mouth at bedtime., Disp: 90 tablet, Rfl: 3   VENTOLIN HFA 108 (90 Base) MCG/ACT inhaler, TAKE 2 PUFFS BY MOUTH EVERY 6 HOURS AS NEEDED FOR WHEEZE OR SHORTNESS OF BREATH, Disp: 18 each, Rfl: 2   Medications ordered in this encounter:  Meds ordered this encounter  Medications    phenazopyridine (PYRIDIUM) 200 MG tablet    Sig: Take 1 tablet (200 mg total) by mouth 3 (three) times daily as needed for pain.    Dispense:  10 tablet    Refill:  0    Order Specific Question:   Supervising Provider    Answer:   Sabra Heck, BRIAN [[id number]]     *If you need refills on other medications prior to your next appointment, please contact your pharmacy*  Follow-Up: Call back or seek an in-person evaluation if the symptoms worsen or if the condition fails to improve as anticipated.  Other Instructions Pelvic Pain, Female Pelvic pain is pain in your lower abdomen, below your belly button and between your hips. The pain may start suddenly (be acute), keep coming back (be recurring), or last a long time (become chronic). Pelvic pain that lasts longer than 6 months is considered chronic. Pelvic pain may affect your: Reproductive organs. Urinary system. Digestive tract. Musculoskeletal system. There are many potential causes of pelvic pain. Sometimes, the pain can be a result of digestive or urinary conditions, strained muscles or ligaments, or reproductive conditions. Sometimes the cause of pelvic pain is not known. Follow these instructions at home:  Take over-the-counter and prescription medicines only as told by your health care provider. Rest as told by your health care provider. Do not have sex if it hurts. Keep a journal of your pelvic pain. Write down: When the  pain started. Where the pain is located. What seems to make the pain better or worse, such as food or your monthly period (menstrual cycle). Any symptoms you have along with the pain. Keep all follow-up visits. This is important. Contact a health care provider if: Medicine does not help your pain, or your pain comes back. You have new symptoms. You have abnormal vaginal discharge or bleeding, including bleeding after menopause. You have a fever or chills. You are constipated. You have blood in your urine or  stool (feces). You have foul-smelling urine. You feel weak or light-headed. Get help right away if: You have sudden severe pain. Your pain gets steadily worse. You have severe pain along with fever, nausea, vomiting, or excessive sweating. You lose consciousness. These symptoms may represent a serious problem that is an emergency. Do not wait to see if the symptoms will go away. Get medical help right away. Call your local emergency services (911 in the [place] Do not drive yourself to the hospital. Summary Pelvic pain is pain in your lower abdomen, below your belly button and between your hips. There are many potential causes of pelvic pain. Keep a journal of your pelvic pain. This information is not intended to replace advice given to you by your health care provider. Make sure you discuss any questions you have with your health care provider. Document Revised: 12/23/2020 Document Reviewed: 12/23/2020 Elsevier Patient Education  [place].    If you have been instructed to have an in-person evaluation today at a local Urgent Care facility, please use the link below. It will take you to a list of all of our available [facility], including address, phone number and hours of operation. Please do not delay care.  [facility]  If you or a family member do not have a primary care provider, use the link below to schedule a visit and establish care. When you choose a [facility] primary care physician or advanced practice provider, you gain a long-term partner in health. Find a Primary Care Provider  Learn more about 's in-office and virtual care options: [facility]

## 2022-03-24 NOTE — Progress Notes (Signed)
Virtual Visit Consent   Kimberly Armstrong, you are scheduled for a virtual visit with a [facility] provider today. Just as with appointments in the office, your consent must be obtained to participate. Your consent will be active for this visit and any virtual visit you may have with one of our providers in the next 365 days. If you have a MyChart account, a copy of this consent can be sent to you electronically.  As this is a virtual visit, video technology does not allow for your provider to perform a traditional examination. This may limit your provider's ability to fully assess your condition. If your provider identifies any concerns that need to be evaluated in person or the need to arrange testing (such as labs, EKG, etc.), we will make arrangements to do so. Although advances in technology are sophisticated, we cannot ensure that it will always work on either your end or our end. If the connection with a video visit is poor, the visit may have to be switched to a telephone visit. With either a video or telephone visit, we are not always able to ensure that we have a secure connection.  By engaging in this virtual visit, you consent to the provision of healthcare and authorize for your insurance to be billed (if applicable) for the services provided during this visit. Depending on your insurance coverage, you may receive a charge related to this service.  I need to obtain your verbal consent now. Are you willing to proceed with your visit today? Kimberly Armstrong has provided verbal consent on 03/24/2022 for a virtual visit (video or telephone). Mar Daring, PA-C  Date: 03/24/2022 11:18 AM  Virtual Visit via Video Note   I, Mar Daring, connected with  Kimberly Armstrong  ([id number], May 18, 1981) on 03/24/22 at 10:00 AM EDT by a video-enabled telemedicine application and verified that I am speaking with the correct person using two  identifiers.  Location: Patient: Virtual Visit Location Patient: Home Provider: Virtual Visit Location Provider: Home Office   I discussed the limitations of evaluation and management by telemedicine and the availability of in person appointments. The patient expressed understanding and agreed to proceed.    History of Present Illness: Kimberly Armstrong is a 41 y.o. who identifies as a female who was assigned female at birth, and is being seen today for chronic abdominal pain.  HPI: Abdominal Pain This is a recurrent problem. The current episode started in the past 7 days (has had issues for years; worsened over last week). The onset quality is sudden. The problem occurs constantly. The problem has been unchanged. The pain is located in the suprapubic region. The pain is at a severity of 8/10. The pain is severe. The quality of the pain is cramping, sharp, a sensation of fullness and colicky (stabbing). Treatments tried: bentyl, gas-x, miralax, 1/2 teaspoon magnesium powder. The treatment provided no relief.    This has been a chronic issue she has had for over 5 years. Over the last year has become more frequent to where it was occurring monthly. She had her most recent episode just over the weekend and now a recurrence today. This is the first time it has happened this close back to back.   She is on Bentyl for pain, but states it does not help completely and just makes her tired. She has seen her Primary care provider, GYN, and GI, without any diagnosis or successful treatments.   Pain is always located  in the suprapubic region and is a sharp, stabbing pain. Sometimes it is associated with BM, sometimes, like now, it is associated with urination. She does feel as if she never fully empties or bowels or bladder. She has seen Urology (for an unrelated renal mass/cyst) and does have documented incomplete bladder emptying on PVR.   Last night the pain came on again following the visit at  the ER on Monday, 03/22/22 (then received Toradol shot and Bentyl Rx). She took a Bentyl and had a BM (normal consistency) but felt like she did not fully empty her bowels and she felt gaseous and bloated. Took a Gas-X and felt "rumblings" but no true gas relief. She then took a capful of Miralax and pushed fluids. Now having loose, watery stools this morning. Then took 1/2 teaspoon of Magnesium powder this morning. Still feels bloated. Also, having a colicky, stabbing pain over bladder that comes with every urination. Denies any dysuria, frequency, urgency, vaginal discharge.   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   Tobacco use disorder, continuous 10/05/2021   Raynaud's phenomenon without gangrene 10/05/2021   Generalized abdominal tenderness without rebound tenderness 09/07/2021   Seasonal allergies 09/07/2021   Acute left-sided low back pain without sciatica 09/07/2021   Cold sore 09/07/2021   Encounter for well woman exam with routine gynecological exam 03/15/2021   Neck muscle spasm 03/15/2021   Mild intermittent asthma without complication 38/75/6433   Body mass index (BMI) of 33.0-33.9 in adult 03/15/2021   Screening for STD (sexually transmitted disease) 03/15/2021   Need for Tdap vaccination 03/15/2021   Encounter to establish care 12/03/2020   Chronic midline low back pain with bilateral sciatica 12/03/2020   Moderate cigarette smoker 12/03/2020   Encounter for screening mammogram for breast cancer 12/03/2020   Asthma 07[birth year]-1978    Allergies: No Known Allergies Medications:  Current Outpatient Medications:    phenazopyridine (PYRIDIUM) 200 MG tablet, Take 1 tablet (200 mg total) by mouth 3 (three) times daily as needed for pain., Disp: 10 tablet, Rfl: 0   baclofen (LIORESAL) 10 MG tablet, TAKE 1  TABLET BY MOUTH THREE TIMES A DAY, Disp: 90 tablet, Rfl: 1   budesonide-formoterol (SYMBICORT) 160-4.5 MCG/ACT inhaler, Inhale 2 puffs into the lungs 2 (two) times daily., Disp: 3 each, Rfl: 1   cetirizine (ZYRTEC) 10 MG tablet, Take 1 tablet (10 mg total) by mouth daily., Disp: 90 tablet, Rfl: 1   diclofenac (VOLTAREN) 75 MG EC tablet, TAKE 1 TABLET BY MOUTH TWICE A DAY, Disp: 60 tablet, Rfl: 2   dicyclomine (BENTYL) 10 MG capsule, Take 1 capsule (10 mg total) by mouth in the morning and at bedtime. As needed, Disp: 30 capsule, Rfl: 2   dicyclomine (BENTYL) 20 MG tablet, Take 1 tablet (20 mg total) by mouth 2 (two) times daily., Disp: 20 tablet, Rfl: 0   fluticasone (FLONASE) 50 MCG/ACT nasal spray, SPRAY 2 SPRAYS INTO EACH NOSTRIL EVERY DAY, Disp: 16 mL, Rfl: 3   ibuprofen (ADVIL) 600 MG tablet, Take 1 tablet (600 mg total) by mouth every 8 (eight) hours as needed., Disp: 30 tablet, Rfl: 0   montelukast (SINGULAIR) 10 MG tablet, Take 1 tablet (10 mg total) by mouth at bedtime., Disp: 90 tablet, Rfl: 3   VENTOLIN HFA 108 (90 Base) MCG/ACT inhaler,  TAKE 2 PUFFS BY MOUTH EVERY 6 HOURS AS NEEDED FOR WHEEZE OR SHORTNESS OF BREATH, Disp: 18 each, Rfl: 2  Observations/Objective: Patient is well-developed, well-nourished in no acute distress.  Resting comfortably at home.  Head is normocephalic, atraumatic.  No labored breathing.  Speech is clear and coherent with logical content.  Patient is alert and oriented at baseline.    Assessment and Plan: 1. Bladder spasm - phenazopyridine (PYRIDIUM) 200 MG tablet; Take 1 tablet (200 mg total) by mouth 3 (three) times daily as needed for pain.  Dispense: 10 tablet; Refill: 0  2. Pelvic pain  - Chronic pelvic pain that is not improving despite multiple evaluations and treatments - Discussed possibilities of interstitial cystitis, endometriosis on other tissues, scar tissue, and IBS-C - Will start Pyridium for bladder spasm today - Continue Bentyl as  prescribed - Push fluids - Advised to follow up with PCP and possibly Urology first for incomplete emptying and bladder spasms - Seek in person evaluation if pain persists or worsens   Follow Up Instructions: I discussed the assessment and treatment plan with the patient. The patient was provided an opportunity to ask questions and all were answered. The patient agreed with the plan and demonstrated an understanding of the instructions.  A copy of instructions were sent to the patient via MyChart unless otherwise noted below.    The patient was advised to call back or seek an in-person evaluation if the symptoms worsen or if the condition fails to improve as anticipated.  Time:  I spent 18 minutes with the patient via telehealth technology discussing the above problems/concerns.    Mar Daring, PA-C

## 2022-03-25 ENCOUNTER — Encounter: Payer: Self-pay | Admitting: Physician Assistant

## 2022-03-25 ENCOUNTER — Ambulatory Visit: Payer: Managed Care, Other (non HMO) | Admitting: Physician Assistant

## 2022-03-25 DIAGNOSIS — L720 Epidermal cyst: Secondary | ICD-10-CM | POA: Diagnosis not present

## 2022-03-25 DIAGNOSIS — D485 Neoplasm of uncertain behavior of skin: Secondary | ICD-10-CM

## 2022-03-25 MED ORDER — MUPIROCIN 2 % EX OINT: 1.0000 | TOPICAL_OINTMENT | Freq: Every day | CUTANEOUS | 11 refills | Status: DC

## 2022-03-25 NOTE — Patient Instructions (Addendum)
Contact your Primary Care Doctor and have them remove your sutures in 2 weeks.   Biopsy, Surgery (Curettage) & Surgery (Excision) Aftercare Instructions  1. Okay to remove bandage in 24 hours  2. Wash area with soap and water  3. Apply Vaseline to area twice daily until healed (Not Neosporin)  4. Okay to cover with a Band-Aid to decrease the chance of infection or prevent irritation from clothing; also it's okay to uncover lesion at home.  5. Suture instructions: return to our office in 7-10 or 10-14 days for a nurse visit for suture removal. Variable healing with sutures, if pain or itching occurs call our office. It's okay to shower or bathe 24 hours after sutures are given.  6. The following risks may occur after a biopsy, curettage or excision: bleeding, scarring, discoloration, recurrence, infection (redness, yellow drainage, pain or swelling).  7. For questions, concerns and results call our office at [place] before 4pm & Friday before 3pm. Biopsy results will be available in 1 week.

## 2022-03-29 ENCOUNTER — Encounter: Payer: Self-pay | Admitting: Nurse Practitioner

## 2022-03-29 ENCOUNTER — Ambulatory Visit
Admission: EM | Admit: 2022-03-29 | Discharge: 2022-03-29 | Disposition: A | Discharge status: Home / Self Care | Payer: Managed Care, Other (non HMO)

## 2022-03-29 ENCOUNTER — Telehealth: Payer: Self-pay | Admitting: Nurse Practitioner

## 2022-03-29 ENCOUNTER — Telehealth: Payer: Self-pay | Admitting: Physician Assistant

## 2022-03-29 DIAGNOSIS — T8131XA Disruption of external operation (surgical) wound, not elsewhere classified, initial encounter: Secondary | ICD-10-CM | POA: Diagnosis not present

## 2022-03-29 NOTE — Telephone Encounter (Signed)
I would suggest patient to schedule an appointment for evaluation and treatment. If we do not have an open appointment, I would suggest going to Urgent Care. AS, CMA

## 2022-03-29 NOTE — Telephone Encounter (Signed)
Patient had a cyst removed on Thursday by her dermatologist and now one of the stiches has came out. She is asking if she can come in here and let someone look at it. I advised her to call her dermatologist back and she said Thursday was there last day open as a practice and now they are closed. Please advise.

## 2022-03-29 NOTE — Telephone Encounter (Signed)
Patient aware.

## 2022-03-29 NOTE — ED Triage Notes (Signed)
Pt reports to uc with concern for suture that popped open from recent derm procedure

## 2022-03-29 NOTE — Telephone Encounter (Signed)
Called patient to see if she needed an additional suture as hers popped out. She had gone to Urgent care and they applied dermabond. She was doing well.

## 2022-03-29 NOTE — ED Provider Notes (Signed)
[name]    [name]: [id number] Arrival date & time: 03/29/22  0630      History   Chief Complaint Chief Complaint  Patient presents with   Suture / Staple Removal    Running stitches broke - Entered by patient    HPI Kimberly Armstrong is a 41 y.o. female.   Patient here today for evaluation of a broken suture to a surgical area on her back.  She reports that she had a cyst removed at dermatology 4 days ago and noticed today that the suture had come loose.  Chart review reveals double layer closure at dermatology.  The history is provided by the patient.  Suture / Staple Removal Pertinent negatives include no abdominal pain.    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   Tobacco use disorder, continuous 10/05/2021   Raynaud's phenomenon without gangrene 10/05/2021   Generalized abdominal tenderness without rebound tenderness 09/07/2021   Seasonal allergies 09/07/2021   Acute left-sided low back pain without sciatica 09/07/2021   Cold sore 09/07/2021   Encounter for well woman exam with routine gynecological exam 03/15/2021   Neck muscle spasm 03/15/2021   Mild intermittent asthma without complication 16/08/930   Body mass index (BMI) of 33.0-33.9 in adult 03/15/2021   Screening for STD (sexually transmitted disease) 03/15/2021   Need for Tdap vaccination 03/15/2021   Encounter to establish care 12/03/2020   Chronic midline low back pain with bilateral sciatica 12/03/2020   Moderate cigarette smoker 12/03/2020   Encounter for screening mammogram for breast cancer 12/03/2020   Asthma 05-29-1981    Past Surgical History:  Procedure Laterality Date   ABDOMINAL HYSTERECTOMY N/A    [employer] 11/30/2020   BACK SURGERY     carpel  tunnel     CESAREAN SECTION     CESAREAN SECTION N/A    [employer] 11/30/2020   EYE SURGERY N/A    [employer] 11/30/2020   PARTIAL HYSTERECTOMY     SPINE SURGERY N/A    [employer] 11/30/2020   TUBAL LIGATION N/A    [employer] 11/30/2020    OB History     Gravida  4   Para  3   Term  3   Preterm  0   AB  1   Living  3      SAB  1   IAB  0   Ectopic  0   Multiple  0   Live Births  3            Home Medications    Prior to Admission medications   Medication Sig Start Date End Date Taking? Authorizing Provider  baclofen (LIORESAL) 10 MG tablet TAKE 1 TABLET BY MOUTH THREE TIMES A DAY 12/14/21   Ronnell Freshwater, NP  budesonide-formoterol (SYMBICORT) 160-4.5 MCG/ACT inhaler Inhale 2 puffs into the lungs 2 (two) times daily. 09/07/21   Ronnell Freshwater, NP  cetirizine (ZYRTEC) 10 MG tablet Take 1 tablet (10 mg total) by mouth daily. 09/07/21   Ronnell Freshwater, NP  diclofenac (VOLTAREN) 75 MG EC tablet TAKE 1 TABLET BY MOUTH TWICE A DAY 01/14/22   Ronnell Freshwater, NP  dicyclomine (BENTYL) 10 MG capsule Take 1 capsule (10 mg total) by  mouth in the morning and at bedtime. As needed 12/15/21   Mauri Pole, MD  dicyclomine (BENTYL) 20 MG tablet Take 1 tablet (20 mg total) by mouth 2 (two) times daily. 03/22/22   Palumbo, April, MD  fluticasone (FLONASE) 50 MCG/ACT nasal spray SPRAY 2 SPRAYS INTO EACH NOSTRIL EVERY DAY 09/07/21   Ronnell Freshwater, NP  ibuprofen (ADVIL) 600 MG tablet Take 1 tablet (600 mg total) by mouth every 8 (eight) hours as needed. 11/13/21   Tempie Hoist, FNP  montelukast (SINGULAIR) 10 MG tablet Take 1 tablet (10 mg total) by mouth at bedtime. 09/07/21   Ronnell Freshwater, NP  mupirocin ointment (BACTROBAN) 2 % Apply 1 Application topically daily. 03/25/22   Sheffield, Ronalee Red, PA-C  phenazopyridine (PYRIDIUM) 200 MG tablet Take 1 tablet (200 mg total) by mouth 3 (three) times daily as needed for pain. 03/24/22   Mar Daring, PA-C  VENTOLIN  HFA 108 (90 Base) MCG/ACT inhaler TAKE 2 PUFFS BY MOUTH EVERY 6 HOURS AS NEEDED FOR WHEEZE OR SHORTNESS OF BREATH 01/14/22   Ronnell Freshwater, NP    Family History Family History  Problem Relation Age of Onset   Breast cancer Mother    Heart attack Mother    Hypertension Mother    Diabetes Sister    Breast cancer Maternal Grandmother    Dementia Paternal Grandmother    Colon cancer Neg Hx    Esophageal cancer Neg Hx    Stomach cancer Neg Hx    Colon polyps Neg Hx     Social History Social History   Tobacco Use   Smoking status: Every Day    Packs/day: 1.00    Types: Cigarettes    Start date: 12/03/2000   Smokeless tobacco: Never  Vaping Use   Vaping Use: Never used  Substance Use Topics   Alcohol use: Yes    Alcohol/week: 2.0 standard drinks of alcohol    Types: 2 Glasses of wine per week   Drug use: Never     Allergies   Patient has no known allergies.   Review of Systems Review of Systems  Constitutional:  Negative for chills and fever.  Eyes:  Negative for discharge and redness.  Gastrointestinal:  Negative for abdominal pain, nausea and vomiting.  Skin:  Positive for wound. Negative for color change.     Physical Exam Triage Vital Signs ED Triage Vitals [03/29/22 1004]  Enc Vitals Group     BP (!) 151/96     Pulse Rate 92     Resp 18     Temp 98.1 F (36.7 C)     Temp src      SpO2 96 %     Weight      Height      Head Circumference      Peak Flow      Pain Score      Pain Loc      Pain Edu?      Excl. in [place]?    No data found.  Updated Vital Signs BP (!) 151/96   Pulse 92   Temp 98.1 F (36.7 C)   Resp 18   SpO2 96%      Physical Exam Vitals and nursing note reviewed.  Constitutional:      General: She is not in acute distress.    Appearance: Normal appearance. She is not ill-appearing.  HENT:     Head: Normocephalic and atraumatic.  Eyes:  Conjunctiva/sclera: Conjunctivae normal.  Cardiovascular:     Rate and Rhythm:  Normal rate.  Pulmonary:     Effort: Pulmonary effort is normal.  Skin:    Comments: Approx 1 inch incision to mid upper back with skin edges well approximated, running suture with one end disconnected/ loose. No bleeding, drainage or swelling/ erythema noted  Neurological:     Mental Status: She is alert.  Psychiatric:        Mood and Affect: Mood normal.        Behavior: Behavior normal.        Thought Content: Thought content normal.      UC Treatments / Results  Labs (all labs ordered are listed, but only abnormal results are displayed) Labs Reviewed - No data to display  EKG   Radiology No results found.  Procedures Procedures (including critical care time)  Medications Ordered in UC Medications - No data to display  Initial Impression / Assessment and Plan / UC Course  I have reviewed the triage vital signs and the nursing notes.  Pertinent labs & imaging results that were available during my care of the patient were reviewed by me and considered in my medical decision making (see chart for details).    Wound appears to be healing well.  Sutures removed and Dermabond applied given only 4 days since surgery to prevent accidental opening of wound.  Encouraged follow-up with any signs of infection or further concerns.  Final Clinical Impressions(s) / UC Diagnoses   Final diagnoses:  Broken suture, initial encounter   Discharge Instructions   None    ED Prescriptions   None    PDMP not reviewed this encounter.   Francene Finders, PA-C 03/29/22 1159

## 2022-04-04 NOTE — Progress Notes (Signed)
Established patient visit   Patient: Kimberly Armstrong   DOB: 1981/05/08   41 y.o. Female  MRN: [id number] Visit Date: 04/05/2022   Chief Complaint  Patient presents with   Follow-up   Subjective    HPI  Follow up for asthma  -has been seen by nephrology, GI, and gyn due to fibroid tumor and renal mass  -continues to have abdominal pain which is intermittent. Had to be seen in ER last week. Was given a shot in the buttocks and a prescription for medication she already had (Bentyl). She states that she doesn't like to take this often as it makes her feel dizzy and woozy. She does see GI provider on 04/15/2022.  -has itching on the skin of the bridge of her nose. She will rub and rub until the skin is raw.  -she states that she has decent control of her asthma. States that she is only having to use her rescue inhaler 2 to 3 times per month.  -did have cyst removed from her upper back two Thursdays. Stitch came out on it's own last Monday. She did go to urgent care. Dermabond was applied. She feels as though this is starting to come loose in it's own.    Medications: Outpatient Medications Prior to Visit  Medication Sig   baclofen (LIORESAL) 10 MG tablet TAKE 1 TABLET BY MOUTH THREE TIMES A DAY   budesonide-formoterol (SYMBICORT) 160-4.5 MCG/ACT inhaler Inhale 2 puffs into the lungs 2 (two) times daily.   cetirizine (ZYRTEC) 10 MG tablet Take 1 tablet (10 mg total) by mouth daily.   diclofenac (VOLTAREN) 75 MG EC tablet TAKE 1 TABLET BY MOUTH TWICE A DAY   dicyclomine (BENTYL) 10 MG capsule Take 1 capsule (10 mg total) by mouth in the morning and at bedtime. As needed   dicyclomine (BENTYL) 20 MG tablet Take 1 tablet (20 mg total) by mouth 2 (two) times daily.   fluticasone (FLONASE) 50 MCG/ACT nasal spray SPRAY 2 SPRAYS INTO EACH NOSTRIL EVERY DAY   ibuprofen (ADVIL) 600 MG tablet Take 1 tablet (600 mg total) by mouth every 8 (eight) hours as needed.   montelukast (SINGULAIR) 10  MG tablet Take 1 tablet (10 mg total) by mouth at bedtime.   mupirocin ointment (BACTROBAN) 2 % Apply 1 Application topically daily.   phenazopyridine (PYRIDIUM) 200 MG tablet Take 1 tablet (200 mg total) by mouth 3 (three) times daily as needed for pain.   VENTOLIN HFA 108 (90 Base) MCG/ACT inhaler TAKE 2 PUFFS BY MOUTH EVERY 6 HOURS AS NEEDED FOR WHEEZE OR SHORTNESS OF BREATH   No facility-administered medications prior to visit.    Review of Systems  Constitutional:  Negative for activity change, appetite change, chills, fatigue and fever.  HENT:  Negative for congestion, postnasal drip, rhinorrhea, sinus pressure, sinus pain, sneezing and sore throat.   Eyes: Negative.   Respiratory:  Positive for shortness of breath and wheezing. Negative for cough and chest tightness.        Intermittent wheezing which is better controlled.  Cardiovascular:  Negative for chest pain and palpitations.  Gastrointestinal:  Negative for abdominal pain, constipation, diarrhea, nausea and vomiting.  Endocrine: Negative for cold intolerance, heat intolerance, polydipsia and polyuria.  Genitourinary:  Negative for dyspareunia, dysuria, flank pain, frequency and urgency.  Musculoskeletal:  Negative for arthralgias, back pain and myalgias.  Skin:  Negative for rash.       Removal of sebaceous cyst on upper, mid back on March 22, 2022. Stitch fell out and she did go to urgent care and they applied dermabond which closed the incision. She feels as though this is coming out as well.   Allergic/Immunologic: Negative for environmental allergies.  Neurological:  Negative for dizziness, weakness and headaches.  Hematological:  Negative for adenopathy.  Psychiatric/Behavioral:  The patient is not nervous/anxious.        Objective     Today's Vitals   04/05/22 0819  BP: 132/85  Pulse: 75  Temp: 97.7 F (36.5 C)  TempSrc: Temporal  Weight: 234 lb (106.1 kg)  Height: '5\' 10"'$  (1.778 m)   Body mass index is  33.58 kg/m.   BP Readings from Last 3 Encounters:  04/05/22 132/85  03/29/22 (!) 151/96  03/22/22 (!) 145/88    Wt Readings from Last 3 Encounters:  04/05/22 234 lb (106.1 kg)  01/20/22 228 lb (103.4 kg)  12/15/21 228 lb 9.6 oz (103.7 kg)    Physical Exam Vitals and nursing note reviewed.  Constitutional:      Appearance: Normal appearance. She is well-developed.  HENT:     Head: Normocephalic and atraumatic.  Eyes:     Pupils: Pupils are equal, round, and reactive to light.  Cardiovascular:     Rate and Rhythm: Normal rate and regular rhythm.     Pulses: Normal pulses.     Heart sounds: Normal heart sounds.  Pulmonary:     Effort: Pulmonary effort is normal.     Breath sounds: Normal breath sounds.  Abdominal:     Palpations: Abdomen is soft.  Musculoskeletal:        General: Normal range of motion.     Cervical back: Normal range of motion and neck supple.  Lymphadenopathy:     Cervical: No cervical adenopathy.  Skin:    General: Skin is warm and dry.     Capillary Refill: Capillary refill takes less than 2 seconds.       Neurological:     General: No focal deficit present.     Mental Status: She is alert and oriented to person, place, and time.  Psychiatric:        Mood and Affect: Mood normal.        Behavior: Behavior normal.        Thought Content: Thought content normal.        Judgment: Judgment normal.       Assessment & Plan    1. Intermittent asthma without complication, unspecified asthma severity Stable. Continue inhalers as needed and as prescribed   2. Seasonal allergies Continue all allergy medications as prescribed   3. Atopic dermatitis, unspecified type Trial kenolog ointment. Apply small amount to bridge of nose, corners of the eyes to improve itching and irritation  - triamcinolone ointment (KENALOG) 0.5 %; Apply small amount to effected areas bid for itching  Dispense: 30 g; Refill: 0  4. Tobacco use disorder, continuous Patent  states that she has been cutting back on her smoking and doing well.   5. Sebaceous cyst Vertical incision healing well. No redness, warmth, or drainage. No evidence of infection at this time.  6. Body mass index (BMI) of 33.0-33.9 in adult Discussed lowering calorie intake to 1500 calories per day and incorporating exercise into daily routine to help lose weight.   Problem List Items Addressed This Visit       Respiratory   Asthma - Primary     Other   Body mass index (BMI) of 33.0-33.9  in adult   Seasonal allergies   Tobacco use disorder, continuous   Other Visit Diagnoses     Atopic dermatitis, unspecified type       Relevant Medications   triamcinolone ointment (KENALOG) 0.5 %   Sebaceous cyst            Return in about 6 months (around 10/06/2022) for health maintenance exam.         Ronnell Freshwater, NP  [place] [facility] [place] [phone] (phone) [phone] (fax)  [place]

## 2022-04-05 ENCOUNTER — Encounter: Payer: Self-pay | Admitting: Nurse Practitioner

## 2022-04-05 ENCOUNTER — Ambulatory Visit (INDEPENDENT_AMBULATORY_CARE_PROVIDER_SITE_OTHER): Payer: Managed Care, Other (non HMO) | Admitting: Nurse Practitioner

## 2022-04-05 VITALS — BP 132/85 | HR 75 | Temp 97.7°F | Ht 70.0 in | Wt 234.0 lb

## 2022-04-05 DIAGNOSIS — J302 Other seasonal allergic rhinitis: Secondary | ICD-10-CM | POA: Diagnosis not present

## 2022-04-05 DIAGNOSIS — F17209 Nicotine dependence, unspecified, with unspecified nicotine-induced disorders: Secondary | ICD-10-CM | POA: Diagnosis not present

## 2022-04-05 DIAGNOSIS — L209 Atopic dermatitis, unspecified: Secondary | ICD-10-CM | POA: Diagnosis not present

## 2022-04-05 DIAGNOSIS — J4520 Mild intermittent asthma, uncomplicated: Secondary | ICD-10-CM | POA: Diagnosis not present

## 2022-04-05 DIAGNOSIS — L723 Sebaceous cyst: Secondary | ICD-10-CM

## 2022-04-05 DIAGNOSIS — Z6833 Body mass index (BMI) 33.0-33.9, adult: Secondary | ICD-10-CM

## 2022-04-05 MED ORDER — TRIAMCINOLONE ACETONIDE 0.5 % EX OINT: TOPICAL_OINTMENT | CUTANEOUS | 0 refills | Status: DC

## 2022-04-08 ENCOUNTER — Encounter: Payer: Self-pay | Admitting: Physician Assistant

## 2022-04-08 NOTE — Progress Notes (Signed)
   New Patient   Subjective  Kimberly Armstrong Osgood is a 41 y.o. female who presents for the following: New Patient (Initial Visit) (Patient here today for cyst on her upper back x 4 years, per patient she has been to the ER for the cyst due to it causing her pain. Per patient there is irritation when she leans back on the cyst. ).   The following portions of the chart were reviewed this encounter and updated as appropriate:  Tobacco  Allergies  Meds  Problems  Med Hx  Surg Hx  Fam Hx      Objective  Well appearing patient in no apparent distress; mood and affect are within normal limits.  A full examination was performed including scalp, head, eyes, ears, nose, lips, neck, chest, axillae, abdomen, back, buttocks, bilateral upper extremities, bilateral lower extremities, hands, feet, fingers, toes, fingernails, and toenails. All findings within normal limits unless otherwise noted below.  Mid Back 2 cm hyperpigmented nodule without drainage   Assessment & Plan  Neoplasm of uncertain behavior of skin Mid Back  Skin / nail biopsy Type of biopsy: punch   Informed consent: discussed and consent obtained   Procedure prep:  Patient was prepped and draped in usual sterile fashion (nonsterile) Prep type:  Chlorhexidine Anesthesia: the lesion was anesthetized in a standard fashion   Anesthetic:  1% lidocaine w/ epinephrine 1-100,000 local infiltration Punch size:  8 mm Suture size:  3-0 Suture type: Prolene (polypropylene)   Suture removal (days):  13 Outcome: patient tolerated procedure well   Post-procedure details: wound care instructions given   Post-procedure details comment:  Nonsterile Additional details:  3-0 vicryl x 3 3-0 prolene x 1  mupirocin ointment (BACTROBAN) 2 % Apply 1 Application topically daily.  Specimen 1 - Surgical pathology Differential Diagnosis: r/o cyst  Check Margins: No     I, Denica Web, PA-C, have reviewed all documentation's for  this visit.  The documentation on 04/08/22 for the exam, diagnosis, procedures and orders are all accurate and complete.

## 2022-04-15 ENCOUNTER — Ambulatory Visit (INDEPENDENT_AMBULATORY_CARE_PROVIDER_SITE_OTHER): Payer: Managed Care, Other (non HMO) | Admitting: Nurse Practitioner

## 2022-04-15 ENCOUNTER — Encounter: Payer: Self-pay | Admitting: Nurse Practitioner

## 2022-04-15 VITALS — BP 126/82 | HR 80 | Ht 70.0 in | Wt 230.0 lb

## 2022-04-15 DIAGNOSIS — R1030 Lower abdominal pain, unspecified: Secondary | ICD-10-CM

## 2022-04-15 DIAGNOSIS — R102 Pelvic and perineal pain: Secondary | ICD-10-CM | POA: Diagnosis not present

## 2022-04-15 MED ORDER — NA SULFATE-K SULFATE-MG SULF 17.5-3.13-1.6 GM/177ML PO SOLN: 1.0000 | Freq: Once | ORAL | 0 refills | Status: AC

## 2022-04-15 NOTE — Progress Notes (Signed)
Chief Complaint:  persistent  lower abdominal pain    Assessment & Plan   # 41 yo female with a history of a partial hysterectomy and C-section with chronic intermittent mid lower abdominal pain and pelvic pressure. Unclear what is causing this pain. Doesn't sound like adhesions. She has had a negative GYN evaluation, unrevealing MRI of abd / pelvis  and unrevealing.non-contrast CT scan.Though she sometimes has sensation of incomplete rectal emptying this doesn't seems to correlate with her pain and purging bowels doesn't help.   I offered her a colonoscopy for further evaluation but explained that colonoscopic exams for pain are often low yield. She would like to proceed. The risks and benefits of colonoscopy with possible polypectomy / biopsies were discussed and the patient agrees to proceed.  If colonoscopy negative then ? may need to see someone for evaluation of chronic pelvic pain?    HPI:    Kimberly Armstrong is a 41 y.o. year old female  known to Dr. Hilarie Fredrickson with a PMH significant for asthma, partial hysterectomy, C-section.  Patient established care here in April 2023 after being referred by her GYN for evaluation of abdominal pain.  When I saw her in clinic she gave a 5-year history of intermittent generalized crampy lower abdominal pain, intestinal gurgling, sensation of incomplete evaluate patient.  Recent abdominal / pelvic MRI had been negative for any GI abnormalities.  Her GYN exam was negative.  She was given a trial of dicyclomine.  I asked her to hold off on her magnesium supplement and instead to try MiraLAX as needed.  She had lost weight, mainly due to trying to avoid certain foods which was causing her GI distress. . Interval history: Since I saw her she has gone to [facility] on 03/22/2022 for evaluation of lower abdominal pain.  Renal CT scan negative for acute findings.  Discharged from the ED on Bentyl.   Kimberly Armstrong continues to have intermittent  mid lower abdominal pain. Pain is sharp, crampy like. Feels like what she felt when giving birth with a pressure type sensation that she can also feel vaginally.  Pain sometimes occurs during defecation. When the pain starts it is constant, progressive and can last a week. She also notices the pain when she starts to urinate ( starts stream) though the pain does not resolve afterwards. She hasn't been taking the bentyl as prescribed because if make her drowsy. She doesn't think it helps anyway. Her bowel movement are at baseline. She does sometimes have sensation of incomplete evacuation. She takes Miralax as needed with good results but that doesn't help the pain.   Previous Labs / Imaging::    Latest Ref Rng & Units 03/22/2022    1:39 AM 12/10/2020    8:22 AM 09/08/2019    2:40 PM  CBC  WBC 4.0 - 10.5 K/uL 6.6  4.7  6.8   Hemoglobin 12.0 - 15.0 g/dL 15.9  16.6  16.2   Hematocrit 36.0 - 46.0 % 46.6  47.9  47.8   Platelets 150 - 400 K/uL 196  176  210     Lab Results  Component Value Date   LIPASE 50 03/22/2022      Latest Ref Rng & Units 03/22/2022    1:39 AM 12/10/2020    8:22 AM 09/08/2019    2:40 PM  CMP  Glucose 70 - 99 mg/dL 128  93  116   BUN 6 - 20 mg/dL 8  8  8   Creatinine 0.44 - 1.00 mg/dL 0.84  0.82  0.79   Sodium 135 - 145 mmol/L 138  138  138   Potassium 3.5 - 5.1 mmol/L 3.5  4.1  3.9   Chloride 98 - 111 mmol/L 107  105  107   CO2 22 - 32 mmol/L '25  20  21   '$ Calcium 8.9 - 10.3 mg/dL 8.8  9.3  9.3   Total Protein 6.5 - 8.1 g/dL 7.1  7.0    Total Bilirubin 0.3 - 1.2 mg/dL 0.7  0.5    Alkaline Phos 38 - 126 U/L 64  75    AST 15 - 41 U/L 20  18    ALT 0 - 44 U/L 23  24       Imaging:  CT Renal Stone Study CLINICAL DATA:  Flank pain  EXAM: CT ABDOMEN AND PELVIS WITHOUT CONTRAST  TECHNIQUE: Multidetector CT imaging of the abdomen and pelvis was performed following the standard protocol without IV contrast.  RADIATION DOSE REDUCTION: This exam was performed  according to the departmental dose-optimization program which includes automated exposure control, adjustment of the mA and/or kV according to patient size and/or use of iterative reconstruction technique.  COMPARISON:  None Available.  FINDINGS: Lower Chest: Normal.  Hepatobiliary: Normal hepatic contours. No intra- or extrahepatic biliary dilatation. The gallbladder is normal.  Pancreas: Normal pancreas. No ductal dilatation or peripancreatic fluid collection.  Spleen: Normal.  Adrenals/Urinary Tract: The adrenal glands are normal. No hydronephrosis, nephroureterolithiasis or solid renal mass. The urinary bladder is normal for degree of distention  Stomach/Bowel: There is no hiatal hernia. Normal duodenal course and caliber. No small bowel dilatation or inflammation. No focal colonic abnormality. Normal appendix.  Vascular/Lymphatic: There is calcific atherosclerosis of the abdominal aorta. No lymphadenopathy.  Reproductive: Status post hysterectomy. No adnexal mass.  Other: None.  Musculoskeletal: No bony spinal canal stenosis or focal osseous abnormality.  IMPRESSION: 1. No acute abnormality of the abdomen or pelvis. 2. Aortic Atherosclerosis (ICD10-I70.0).  Electronically Signed   By: Ulyses Jarred M.D.   On: 03/22/2022 02:52  IMPRESSION: 1. Patient is presumed to be status post supracervical hysterectomy. Close correlation with surgical records recommended. 1.9 x 1.7 x 1.8 cm complex cystic mass is identified in the central cervical stroma. Cervical canal not well demonstrated in this lesion may be related to the canal or in the central stroma. The lesion may represent a tight cluster of multiple cysts or a single multicystic lesion. Large nabothian cyst would be a consideration. Cystic cervicitis would also be a consideration. Adenoma malignum/neoplasm cannot be excluded by imaging. 2. A second small nodular component along the anterior wall of the cervix  is immediately anterior to the above described lesion and measures up to 1.3 cm in maximum dimension. This lesion does appear to generates some mass-effect on the posterior wall of the urinary bladder. While imaging features are nonspecific, this does not appear overtly aggressive and may simply represent scar. 3. Unremarkable MR appearance of the ovaries.    Past Medical History:  Diagnosis Date   Asthma    Past Surgical History:  Procedure Laterality Date   ABDOMINAL HYSTERECTOMY N/A    [employer] 11/30/2020   BACK SURGERY     carpel tunnel     CESAREAN SECTION     CESAREAN SECTION N/A    [employer] 11/30/2020   EYE SURGERY N/A    [employer] 11/30/2020   PARTIAL HYSTERECTOMY     SPINE SURGERY  N/A    [employer] 11/30/2020   TUBAL LIGATION N/A    [facility] 11/30/2020   Family History  Problem Relation Age of Onset   Breast cancer Mother    Heart attack Mother    Hypertension Mother    Diabetes Sister    Breast cancer Maternal Grandmother    Dementia Paternal Grandmother    Colon cancer Neg Hx    Esophageal cancer Neg Hx    Stomach cancer Neg Hx    Colon polyps Neg Hx    Pancreatic cancer Neg Hx    Social History   Tobacco Use   Smoking status: Every Day    Packs/day: 1.00    Types: Cigarettes    Start date: 12/03/2000   Smokeless tobacco: Never  Vaping Use   Vaping Use: Never used  Substance Use Topics   Alcohol use: Yes    Alcohol/week: 2.0 standard drinks of alcohol    Types: 2 Glasses of wine per week   Drug use: Never   Current Outpatient Medications  Medication Sig Dispense Refill   baclofen (LIORESAL) 10 MG tablet TAKE 1 TABLET BY MOUTH THREE TIMES A DAY 90 tablet 1   budesonide-formoterol (SYMBICORT) 160-4.5 MCG/ACT inhaler Inhale 2 puffs into the lungs 2 (two) times daily. 3 each 1   cetirizine (ZYRTEC) 10 MG tablet Take 1 tablet (10 mg total) by mouth daily. 90 tablet 1   diclofenac (VOLTAREN) 75 MG EC tablet TAKE 1 TABLET BY MOUTH TWICE A DAY 60 tablet  2   dicyclomine (BENTYL) 20 MG tablet Take 1 tablet (20 mg total) by mouth 2 (two) times daily. 20 tablet 0   fluticasone (FLONASE) 50 MCG/ACT nasal spray SPRAY 2 SPRAYS INTO EACH NOSTRIL EVERY DAY 16 mL 3   ibuprofen (ADVIL) 600 MG tablet Take 1 tablet (600 mg total) by mouth every 8 (eight) hours as needed. 30 tablet 0   montelukast (SINGULAIR) 10 MG tablet Take 1 tablet (10 mg total) by mouth at bedtime. 90 tablet 3   mupirocin ointment (BACTROBAN) 2 % Apply 1 Application topically daily. 22 g 11   phenazopyridine (PYRIDIUM) 200 MG tablet Take 1 tablet (200 mg total) by mouth 3 (three) times daily as needed for pain. 10 tablet 0   polyethylene glycol (MIRALAX / GLYCOLAX) 17 g packet Take 17 g by mouth as needed.     triamcinolone ointment (KENALOG) 0.5 % Apply small amount to effected areas bid for itching 30 g 0   VENTOLIN HFA 108 (90 Base) MCG/ACT inhaler TAKE 2 PUFFS BY MOUTH EVERY 6 HOURS AS NEEDED FOR WHEEZE OR SHORTNESS OF BREATH 18 each 2   No current facility-administered medications for this visit.   No Known Allergies   Review of Systems: No chest pain. No SHOB. No fevers.   Wt Readings from Last 3 Encounters:  04/15/22 230 lb (104.3 kg)  04/05/22 234 lb (106.1 kg)  01/20/22 228 lb (103.4 kg)    Physical Exam   BP 126/82   Pulse 80   Ht '5\' 10"'$  (1.778 m)   Wt 230 lb (104.3 kg)   BMI 33.00 kg/m  Constitutional:  Generally well appearing female in no acute distress. Psychiatric: Pleasant. Normal mood and affect. Behavior is normal. EENT: Pupils normal.  Conjunctivae are normal. No scleral icterus. Neck supple.  Cardiovascular: Normal rate, regular rhythm. No edema Pulmonary/chest: Effort normal and breath sounds normal. No wheezing, rales or rhonchi. Abdominal: Soft, nondistended, nontender. Bowel sounds active throughout. There are no  masses palpable. No hepatomegaly. Neurological: Alert and oriented to person place and time. Skin: Skin is warm and dry. No rashes  noted.  Tye Savoy, NP  04/15/2022, 10:14 AM  Cc:  Referring Provider Ronnell Freshwater, NP

## 2022-04-15 NOTE — Patient Instructions (Signed)
_______________________________________________________  If you are age 43 or older, your body mass index should be between 23-30. Your Body mass index is 33 kg/m. If this is out of the aforementioned range listed, please consider follow up with your Primary Care Provider.  If you are age 54 or younger, your body mass index should be between 19-25. Your Body mass index is 33 kg/m. If this is out of the aformentioned range listed, please consider follow up with your Primary Care Provider.   ________________________________________________________  The [facility] providers would like to encourage you to use [facility] to communicate with providers for non-urgent requests or questions.  Due to long hold times on the telephone, sending your provider a message by [facility] may be a faster and more efficient way to get a response.  Please allow 48 business hours for a response.  Please remember that this is for non-urgent requests.  _______________________________________________________  Kimberly Armstrong have been scheduled for a colonoscopy. Please follow written instructions given to you at your visit today.  Please pick up your prep supplies at the pharmacy within the next 1-3 days. If you use inhalers (even only as needed), please bring them with you on the day of your procedure.  It was a pleasure to see you today!  Thank you for trusting me with your gastrointestinal care!

## 2022-04-16 NOTE — Progress Notes (Signed)
Addendum: Reviewed and agree with assessment and management plan. Tearah Saulsbury M, MD  

## 2022-04-26 ENCOUNTER — Telehealth: Payer: Self-pay | Admitting: Internal Medicine

## 2022-04-26 NOTE — Telephone Encounter (Signed)
Left message for pt that pts usually only need the day before the procedure for the prep and they are able to go back to work the day after the procedure.

## 2022-04-26 NOTE — Telephone Encounter (Signed)
Patient called would like to know how long she needs to request from work for procedure. Please call to advise

## 2022-04-26 NOTE — Telephone Encounter (Signed)
Patient called, would like to know how long she needs to take off work for procedure. Please call to advise.

## 2022-04-27 ENCOUNTER — Encounter: Payer: Self-pay | Admitting: Urology

## 2022-04-27 ENCOUNTER — Ambulatory Visit (INDEPENDENT_AMBULATORY_CARE_PROVIDER_SITE_OTHER): Payer: Managed Care, Other (non HMO) | Admitting: Urology

## 2022-04-27 VITALS — BP 135/94 | HR 82 | Temp 99.3°F | Ht 69.0 in | Wt 228.0 lb

## 2022-04-27 DIAGNOSIS — M6289 Other specified disorders of muscle: Secondary | ICD-10-CM

## 2022-04-27 DIAGNOSIS — R102 Pelvic and perineal pain: Secondary | ICD-10-CM

## 2022-04-27 DIAGNOSIS — R339 Retention of urine, unspecified: Secondary | ICD-10-CM | POA: Diagnosis not present

## 2022-04-27 LAB — URINALYSIS, COMPLETE
Bilirubin, UA: NEGATIVE
Glucose, UA: NEGATIVE
Ketones, UA: NEGATIVE
Leukocytes,UA: NEGATIVE
Nitrite, UA: NEGATIVE
Protein,UA: NEGATIVE
Specific Gravity, UA: <1.005 — ABNORMAL LOW (ref 1.005–1.030)
Urobilinogen, Ur: 0.2 mg/dL (ref 0.2–1.0)
pH, UA: 6.5 (ref 5.0–7.5)

## 2022-04-27 LAB — MICROSCOPIC EXAMINATION: Bacteria, UA: NONE SEEN

## 2022-04-27 LAB — BLADDER SCAN AMB NON-IMAGING

## 2022-04-27 NOTE — Progress Notes (Signed)
04/27/2022 10:26 AM   Kimberly Armstrong 08-May-1981 [id number]  Referring provider: Ronnell Freshwater, NP [place],   [id number]  Chief Complaint  Patient presents with   Follow-up    Bladder pain    HPI: 41 year old female who presents today for further evaluation of "bladder pain".  Notably, she establish care recently in May 2023 for possible underlying mass.  She does at times feels like she does not empty her bladder which was addressed at last visit.  Initial bladder scan in the 300 range, asked to void a second time with second PVR of 0.  She is seeing OB/GYN as well as GI for chronic pelvic pain.  No obvious underlying issues were identified.  She is undergoing a screening colonoscopy coming up.  Urinalysis today is negative.    She does report having chills with a low-grade temp today of 99.    Noncontrast CT scan abdomen pelvis om July 2023 is negative.  Today, her primary concern is discomfort sometimes with voiding.  She does not feel burning per se, more about pressure and lower abdominal pain when straining to urinate and just after urination.  She has to shift positions.  She also has pain with penile penetration at times which triggers spasms and discomfort.  She describes it as feeling like her partner is "bottoming out" with penetration.  She knows to avoid intercourse if she is having her lower abdominal discomfort.   PMH: Past Medical History:  Diagnosis Date   Asthma     Surgical History: Past Surgical History:  Procedure Laterality Date   ABDOMINAL HYSTERECTOMY N/A    [facility]reesia 11/30/2020   BACK SURGERY     carpel tunnel     CESAREAN SECTION     CESAREAN SECTION N/A    [facility]reesia 11/30/2020   EYE SURGERY N/A    [facility]reesia 11/30/2020   PARTIAL HYSTERECTOMY     SPINE SURGERY N/A    [facility] 11/30/2020   TUBAL LIGATION N/A    [facility]reesia 11/30/2020    Home Medications:  Allergies as of 04/27/2022   No Known  Allergies      Medication List        Accurate as of April 27, 2022 10:26 AM. If you have any questions, ask your nurse or doctor.          baclofen 10 MG tablet Commonly known as: LIORESAL TAKE 1 TABLET BY MOUTH THREE TIMES A DAY   budesonide-formoterol 160-4.5 MCG/ACT inhaler Commonly known as: SYMBICORT Inhale 2 puffs into the lungs 2 (two) times daily.   cetirizine 10 MG tablet Commonly known as: ZYRTEC Take 1 tablet (10 mg total) by mouth daily.   diclofenac 75 MG EC tablet Commonly known as: VOLTAREN TAKE 1 TABLET BY MOUTH TWICE A DAY   dicyclomine 20 MG tablet Commonly known as: BENTYL Take 1 tablet (20 mg total) by mouth 2 (two) times daily.   fluticasone 50 MCG/ACT nasal spray Commonly known as: FLONASE SPRAY 2 SPRAYS INTO EACH NOSTRIL EVERY DAY   ibuprofen 600 MG tablet Commonly known as: ADVIL Take 1 tablet (600 mg total) by mouth every 8 (eight) hours as needed.   montelukast 10 MG tablet Commonly known as: SINGULAIR Take 1 tablet (10 mg total) by mouth at bedtime.   mupirocin ointment 2 % Commonly known as: BACTROBAN Apply 1 Application topically daily.   phenazopyridine 200 MG tablet Commonly known as: Pyridium Take 1 tablet (200 mg total)  by mouth 3 (three) times daily as needed for pain.   polyethylene glycol 17 g packet Commonly known as: MIRALAX / GLYCOLAX Take 17 g by mouth as needed.   triamcinolone ointment 0.5 % Commonly known as: KENALOG Apply small amount to effected areas bid for itching   Ventolin HFA 108 (90 Base) MCG/ACT inhaler Generic drug: albuterol TAKE 2 PUFFS BY MOUTH EVERY 6 HOURS AS NEEDED FOR WHEEZE OR SHORTNESS OF BREATH        Allergies: No Known Allergies  Family History: Family History  Problem Relation Age of Onset   Breast cancer Mother    Heart attack Mother    Hypertension Mother    Diabetes Sister    Breast cancer Maternal Grandmother    Dementia Paternal Grandmother    Colon cancer Neg Hx     Esophageal cancer Neg Hx    Stomach cancer Neg Hx    Colon polyps Neg Hx    Pancreatic cancer Neg Hx     Social History:  reports that she has been smoking cigarettes. She started smoking about 21 years ago. She has been smoking an average of 1 pack per day. She has never used smokeless tobacco. She reports current alcohol use of about 2.0 standard drinks of alcohol per week. She reports that she does not use drugs.   Physical Exam: BP (!) 135/94   Pulse 82   Temp 99.3 F (37.4 C)   Ht '5\' 9"'$  (1.753 m)   Wt 228 lb (103.4 kg)   BMI 33.67 kg/m   Constitutional:  Alert and oriented, No acute distress. HEENT: [place] AT, moist mucus membranes.  Trachea midline, no masses. Cardiovascular: No clubbing, cyanosis, or edema. Respiratory: Normal respiratory effort, no increased work of breathing. Skin: No rashes, bruises or suspicious lesions. Neurologic: Grossly intact, no focal deficits, moving all 4 extremities. Psychiatric: Normal mood and affect.  Laboratory Data: Lab Results  Component Value Date   WBC 6.6 03/22/2022   HGB 15.9 (H) 03/22/2022   HCT 46.6 (H) 03/22/2022   MCV 89.1 03/22/2022   PLT 196 03/22/2022    Lab Results  Component Value Date   CREATININE 0.84 03/22/2022    Lab Results  Component Value Date   HGBA1C 5.6 12/10/2020    Urinalysis UA today is negative, no microscopic blood   Pertinent Imaging: CT Renal Stone Study  Narrative CLINICAL DATA:  Flank pain  EXAM: CT ABDOMEN AND PELVIS WITHOUT CONTRAST  TECHNIQUE: Multidetector CT imaging of the abdomen and pelvis was performed following the standard protocol without IV contrast.  RADIATION DOSE REDUCTION: This exam was performed according to the departmental dose-optimization program which includes automated exposure control, adjustment of the mA and/or kV according to patient size and/or use of iterative reconstruction technique.  COMPARISON:  None Available.  FINDINGS: Lower Chest:  Normal.  Hepatobiliary: Normal hepatic contours. No intra- or extrahepatic biliary dilatation. The gallbladder is normal.  Pancreas: Normal pancreas. No ductal dilatation or peripancreatic fluid collection.  Spleen: Normal.  Adrenals/Urinary Tract: The adrenal glands are normal. No hydronephrosis, nephroureterolithiasis or solid renal mass. The urinary bladder is normal for degree of distention  Stomach/Bowel: There is no hiatal hernia. Normal duodenal course and caliber. No small bowel dilatation or inflammation. No focal colonic abnormality. Normal appendix.  Vascular/Lymphatic: There is calcific atherosclerosis of the abdominal aorta. No lymphadenopathy.  Reproductive: Status post hysterectomy. No adnexal mass.  Other: None.  Musculoskeletal: No bony spinal canal stenosis or focal osseous abnormality.  IMPRESSION:  1. No acute abnormality of the abdomen or pelvis. 2. Aortic Atherosclerosis (ICD10-I70.0).   Electronically Signed By: Ulyses Jarred M.D. On: 03/22/2022 02:52  I personally reviewed her noncontrast CT scan as above.  Agree with radiologic interpretation.   Assessment & Plan:    1. Incomplete bladder emptying Ultimately able to void  Continue double void and position change to ensure complete emptying - Urinalysis, Complete - BLADDER SCAN AMB NON-IMAGING - Ambulatory referral to Physical Therapy  2. Pelvic pain in female Based on the nature of her pain along with dyspareunia, suspect she has some component of pelvic floor dysfunction.  She may benefit from referral to physical therapy.  We discussed this in detail and she is agreeable this plan.  Role of cystoscopy is limited at this point in time.  She has a reassuring CT scan, negative urinalysis times multiple as well as no other warning symptoms including no dysuria, gross hematuria, etc.  We will defer this for the time being but consider if she fails to improve with PT. - Ambulatory referral to  Physical Therapy  3. Pelvic floor dysfunction in female As above - Ambulatory referral to Physical Therapy   Return if symptoms worsen or fail to improve.  Hollice Espy, MD  [facility] [address], [place], [place] [address] [phone]

## 2022-05-10 ENCOUNTER — Encounter: Payer: Managed Care, Other (non HMO) | Admitting: Internal Medicine

## 2022-05-10 ENCOUNTER — Telehealth: Payer: Self-pay | Admitting: Internal Medicine

## 2022-05-10 NOTE — Telephone Encounter (Signed)
Patient called to cancel procedure for today.

## 2022-05-20 ENCOUNTER — Ambulatory Visit
Admission: EM | Admit: 2022-05-20 | Discharge: 2022-05-20 | Disposition: A | Discharge status: Home / Self Care | Payer: Managed Care, Other (non HMO) | Attending: Physician Assistant | Admitting: Physician Assistant

## 2022-05-20 ENCOUNTER — Other Ambulatory Visit: Payer: Self-pay

## 2022-05-20 ENCOUNTER — Encounter: Payer: Self-pay | Admitting: Emergency Medicine

## 2022-05-20 ENCOUNTER — Telehealth: Payer: Managed Care, Other (non HMO) | Admitting: Physician Assistant

## 2022-05-20 DIAGNOSIS — N3001 Acute cystitis with hematuria: Secondary | ICD-10-CM | POA: Diagnosis present

## 2022-05-20 DIAGNOSIS — R109 Unspecified abdominal pain: Secondary | ICD-10-CM

## 2022-05-20 DIAGNOSIS — R5383 Other fatigue: Secondary | ICD-10-CM

## 2022-05-20 LAB — POCT URINALYSIS DIP (MANUAL ENTRY)
Bilirubin, UA: NEGATIVE
Glucose, UA: 100 mg/dL — AB
Ketones, POC UA: NEGATIVE mg/dL
Leukocytes, UA: NEGATIVE
Nitrite, UA: POSITIVE — AB
Protein Ur, POC: NEGATIVE mg/dL
Spec Grav, UA: <=1.005 — AB (ref 1.010–1.025)
Urobilinogen, UA: 1 E.U./dL
pH, UA: 5 (ref 5.0–8.0)

## 2022-05-20 MED ORDER — SULFAMETHOXAZOLE-TRIMETHOPRIM 800-160 MG PO TABS: 1.0000 | ORAL_TABLET | Freq: Two times a day (BID) | ORAL | 0 refills | Status: AC

## 2022-05-20 NOTE — Discharge Instructions (Signed)
We are going to start you on an antibiotic to cover for infection.  Take Bactrim DS twice daily.  If you develop any rash or oral lesions you need to stop the medication to be seen immediately.  Make sure you are drinking plenty of fluid.  Follow-up closely with your primary care/urologist.  We will contact you if we need to change antibiotic based on your culture.  If anything changes or worsens including increased pain, fever, nausea/vomiting, blood in your urine, weakness you need to go to the emergency room immediately.

## 2022-05-20 NOTE — Patient Instructions (Signed)
Kimberly Armstrong, thank you for joining [name], PA-C for today's virtual visit.  While this provider is not your primary care provider (PCP), if your PCP is located in our provider database this encounter information will be shared with them immediately following your visit.  Consent: (Patient) Kimberly Armstrong provided verbal consent for this virtual visit at the beginning of the encounter.  Current Medications:  Current Outpatient Medications:    baclofen (LIORESAL) 10 MG tablet, TAKE 1 TABLET BY MOUTH THREE TIMES A DAY, Disp: 90 tablet, Rfl: 1   budesonide-formoterol (SYMBICORT) 160-4.5 MCG/ACT inhaler, Inhale 2 puffs into the lungs 2 (two) times daily., Disp: 3 each, Rfl: 1   cetirizine (ZYRTEC) 10 MG tablet, Take 1 tablet (10 mg total) by mouth daily., Disp: 90 tablet, Rfl: 1   diclofenac (VOLTAREN) 75 MG EC tablet, TAKE 1 TABLET BY MOUTH TWICE A DAY, Disp: 60 tablet, Rfl: 2   dicyclomine (BENTYL) 20 MG tablet, Take 1 tablet (20 mg total) by mouth 2 (two) times daily. (Patient not taking: Reported on 04/27/2022), Disp: 20 tablet, Rfl: 0   fluticasone (FLONASE) 50 MCG/ACT nasal spray, SPRAY 2 SPRAYS INTO EACH NOSTRIL EVERY DAY, Disp: 16 mL, Rfl: 3   ibuprofen (ADVIL) 600 MG tablet, Take 1 tablet (600 mg total) by mouth every 8 (eight) hours as needed., Disp: 30 tablet, Rfl: 0   montelukast (SINGULAIR) 10 MG tablet, Take 1 tablet (10 mg total) by mouth at bedtime., Disp: 90 tablet, Rfl: 3   mupirocin ointment (BACTROBAN) 2 %, Apply 1 Application topically daily., Disp: 22 g, Rfl: 11   phenazopyridine (PYRIDIUM) 200 MG tablet, Take 1 tablet (200 mg total) by mouth 3 (three) times daily as needed for pain. (Patient not taking: Reported on 04/27/2022), Disp: 10 tablet, Rfl: 0   polyethylene glycol (MIRALAX / GLYCOLAX) 17 g packet, Take 17 g by mouth as needed. (Patient not taking: Reported on 04/27/2022), Disp: , Rfl:    triamcinolone ointment (KENALOG) 0.5 %, Apply  small amount to effected areas bid for itching, Disp: 30 g, Rfl: 0   VENTOLIN HFA 108 (90 Base) MCG/ACT inhaler, TAKE 2 PUFFS BY MOUTH EVERY 6 HOURS AS NEEDED FOR WHEEZE OR SHORTNESS OF BREATH, Disp: 18 each, Rfl: 2   Medications ordered in this encounter:  No orders of the defined types were placed in this encounter.    *If you need refills on other medications prior to your next appointment, please contact your pharmacy*  Follow-Up: Call back or seek an in-person evaluation if the symptoms worsen or if the condition fails to improve as anticipated.  Other Instructions Call PCP or Urologist now to see if they can see you today. If not, please use link below to get seen in person.   If you have been instructed to have an in-person evaluation today at a local Urgent Care facility, please use the link below. It will take you to a list of all of our available [facility], including address, phone number and hours of operation. Please do not delay care.  [facility]  If you or a family member do not have a primary care provider, use the link below to schedule a visit and establish care. When you choose a [facility] primary care physician or advanced practice provider, you gain a long-term partner in health. Find a Primary Care Provider  Learn more about [facility]'s in-office and virtual care options: [facility]

## 2022-05-20 NOTE — ED Provider Notes (Signed)
[name]    [name]: [id number] Arrival date & time: 05/20/22  0859      History   Chief Complaint Chief Complaint  Patient presents with   Back Pain    Kidney pain, right side. - Entered by patient    HPI Kimberly Armstrong is a 41 y.o. female.   Patient presents today with a 2-day history of right lower back pain.  She is concerned that she has urinary tract infection as she is having increased lower abdominal cramping and suprapubic pain.  She does report some dysuria but denies any frequency or urgency.  She has a prolonged history of lower abdominal cramping and has been evaluated by GYN as well as urology without identifiable etiology.  Reports that symptoms are similar to previous episodes but more intense and frequent.  She denies any recent urogenital procedure.  She does have a history of abdominal surgeries including hysterectomy so discomfort that she is not pregnant.  She reports that pain is rated 6 on a 0-10 pain scale, described as sharp, no aggravating relieving factors identified.  Denies any recent antibiotic.  Denies history of diabetes or SGLT2 use inhibitor.  She has tried over-the-counter medications without improvement of symptoms.  Denies any vaginal discharge or pelvic pain.  She did complete a video visit earlier today who recommended in person evaluation.    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   Tobacco use disorder, continuous 10/05/2021   Raynaud's phenomenon without gangrene 10/05/2021   Generalized abdominal tenderness without rebound tenderness 09/07/2021   Seasonal allergies 09/07/2021   Acute left-sided low back pain without sciatica 09/07/2021   Cold sore 09/07/2021   Encounter for well woman exam with  routine gynecological exam 03/15/2021   Neck muscle spasm 03/15/2021   Mild intermittent asthma without complication 27/10/5007   Body mass index (BMI) of 33.0-33.9 in adult 03/15/2021   Screening for STD (sexually transmitted disease) 03/15/2021   Need for Tdap vaccination 03/15/2021   Encounter to establish care 12/03/2020   Chronic midline low back pain with bilateral sciatica 12/03/2020   Moderate cigarette smoker 12/03/2020   Encounter for screening mammogram for breast cancer 12/03/2020   Asthma 05/05/1981    Past Surgical History:  Procedure Laterality Date   ABDOMINAL HYSTERECTOMY N/A    [facility]ia 11/30/2020   BACK SURGERY     carpel tunnel     CESAREAN SECTION     CESAREAN SECTION N/A    [facility]reesia 11/30/2020   EYE SURGERY N/A    [facility]reesia 11/30/2020   PARTIAL HYSTERECTOMY     SPINE SURGERY N/A    [facility]reesia 11/30/2020   TUBAL LIGATION N/A    [facility]reesia 11/30/2020    OB History     Gravida  4   Para  3   Term  3   Preterm  0   AB  1   Living  3      SAB  1   IAB  0   Ectopic  0   Multiple  0   Live Births  3            Home Medications    Prior to Admission medications   Medication Sig Start Date End  Date Taking? Authorizing Provider  sulfamethoxazole-trimethoprim (BACTRIM DS) 800-160 MG tablet Take 1 tablet by mouth 2 (two) times daily for 5 days. 05/20/22 05/25/22 Yes Annye Forrey K, PA-C  baclofen (LIORESAL) 10 MG tablet TAKE 1 TABLET BY MOUTH THREE TIMES A DAY 12/14/21   Ronnell Freshwater, NP  budesonide-formoterol (SYMBICORT) 160-4.5 MCG/ACT inhaler Inhale 2 puffs into the lungs 2 (two) times daily. 09/07/21   Ronnell Freshwater, NP  cetirizine (ZYRTEC) 10 MG tablet Take 1 tablet (10 mg total) by mouth daily. 09/07/21   Ronnell Freshwater, NP  diclofenac (VOLTAREN) 75 MG EC tablet TAKE 1 TABLET BY MOUTH TWICE A DAY 01/14/22   Ronnell Freshwater, NP  dicyclomine (BENTYL) 20 MG tablet Take 1 tablet (20 mg total) by mouth 2 (two) times daily. Patient  not taking: Reported on 04/27/2022 03/22/22   Palumbo, April, MD  fluticasone [facility]) 50 MCG/ACT nasal spray SPRAY 2 SPRAYS INTO EACH NOSTRIL EVERY DAY 09/07/21   Ronnell Freshwater, NP  ibuprofen (ADVIL) 600 MG tablet Take 1 tablet (600 mg total) by mouth every 8 (eight) hours as needed. 11/13/21   Tempie Hoist, FNP  montelukast (SINGULAIR) 10 MG tablet Take 1 tablet (10 mg total) by mouth at bedtime. 09/07/21   Ronnell Freshwater, NP  mupirocin ointment (BACTROBAN) 2 % Apply 1 Application topically daily. 03/25/22   Sheffield, Ronalee Red, PA-C  phenazopyridine (PYRIDIUM) 200 MG tablet Take 1 tablet (200 mg total) by mouth 3 (three) times daily as needed for pain. Patient not taking: Reported on 04/27/2022 03/24/22   Mar Daring, PA-C  polyethylene glycol (MIRALAX / GLYCOLAX) 17 g packet Take 17 g by mouth as needed. Patient not taking: Reported on 04/27/2022    [provider]  triamcinolone ointment (KENALOG) 0.5 % Apply small amount to effected areas bid for itching 04/05/22   Ronnell Freshwater, NP  VENTOLIN HFA 108 (90 Base) MCG/ACT inhaler TAKE 2 PUFFS BY MOUTH EVERY 6 HOURS AS NEEDED FOR WHEEZE OR SHORTNESS OF BREATH 01/14/22   Ronnell Freshwater, NP    Family History Family History  Problem Relation Age of Onset   Breast cancer Mother    Heart attack Mother    Hypertension Mother    Diabetes Sister    Breast cancer Maternal Grandmother    Dementia Paternal Grandmother    Colon cancer Neg Hx    Esophageal cancer Neg Hx    Stomach cancer Neg Hx    Colon polyps Neg Hx    Pancreatic cancer Neg Hx     Social History Social History   Tobacco Use   Smoking status: Every Day    Packs/day: 1.00    Types: Cigarettes    Start date: 12/03/2000   Smokeless tobacco: Never  Vaping Use   Vaping Use: Never used  Substance Use Topics   Alcohol use: Yes    Alcohol/week: 2.0 standard drinks of alcohol    Types: 2 Glasses of wine per week   Drug use: Never     Allergies   Patient  has no known allergies.   Review of Systems Review of Systems  Constitutional:  Positive for activity change. Negative for appetite change, fatigue and fever.  Respiratory:  Negative for cough and shortness of breath.   Cardiovascular:  Negative for chest pain.  Gastrointestinal:  Positive for abdominal pain. Negative for diarrhea, nausea and vomiting.  Genitourinary:  Positive for dysuria. Negative for frequency, genital sores, urgency, vaginal bleeding, vaginal  discharge and vaginal pain.  Musculoskeletal:  Positive for back pain. Negative for arthralgias and myalgias.     Physical Exam Triage Vital Signs ED Triage Vitals  Enc Vitals Group     BP 05/20/22 1032 (!) 139/98     Pulse Rate 05/20/22 1032 66     Resp 05/20/22 1032 18     Temp 05/20/22 1032 97.9 F (36.6 C)     Temp Source 05/20/22 1032 Oral     SpO2 05/20/22 1032 98 %     Weight --      Height --      Head Circumference --      Peak Flow --      Pain Score 05/20/22 1033 6     Pain Loc --      Pain Edu? --      Excl. in [place]? --    No data found.  Updated Vital Signs BP (!) 139/98 (BP Location: Left Arm)   Pulse 66   Temp 97.9 F (36.6 C) (Oral)   Resp 18   SpO2 98%   Visual Acuity Right Eye Distance:   Left Eye Distance:   Bilateral Distance:    Right Eye Near:   Left Eye Near:    Bilateral Near:     Physical Exam Vitals reviewed.  Constitutional:      General: She is awake. She is not in acute distress.    Appearance: Normal appearance. She is well-developed. She is not ill-appearing.     Comments: Very pleasant female appears stated age in no acute distress sitting comfortably in exam room  HENT:     Head: Normocephalic and atraumatic.  Cardiovascular:     Rate and Rhythm: Normal rate and regular rhythm.     Heart sounds: Normal heart sounds, S1 normal and S2 normal. No murmur heard. Pulmonary:     Effort: Pulmonary effort is normal.     Breath sounds: Normal breath sounds. No wheezing,  rhonchi or rales.     Comments: Clear to auscultation bilaterally Abdominal:     General: Bowel sounds are normal.     Palpations: Abdomen is soft.     Tenderness: There is abdominal tenderness in the suprapubic area. There is no right CVA tenderness, left CVA tenderness, guarding or rebound.  Musculoskeletal:     Cervical back: No tenderness or bony tenderness.     Thoracic back: No tenderness or bony tenderness.     Lumbar back: Tenderness present. No bony tenderness. Negative right straight leg raise test and negative left straight leg raise test.  Psychiatric:        Behavior: Behavior is cooperative.      UC Treatments / Results  Labs (all labs ordered are listed, but only abnormal results are displayed) Labs Reviewed  POCT URINALYSIS DIP (MANUAL ENTRY) - Abnormal; Notable for the following components:      Result Value   Color, UA orange (*)    Glucose, UA =100 (*)    Spec Grav, UA <=1.005 (*)    Blood, UA small (*)    Nitrite, UA Positive (*)    All other components within normal limits  URINE CULTURE    EKG   Radiology No results found.  Procedures Procedures (including critical care time)  Medications Ordered in UC Medications - No data to display  Initial Impression / Assessment and Plan / UC Course  I have reviewed the triage vital signs and the nursing notes.  Pertinent labs &  imaging results that were available during my care of the patient were reviewed by me and considered in my medical decision making (see chart for details).     Patient is well-appearing, afebrile, nontoxic, nontachycardic.  Vital signs and physical exam reassuring today; no indication for emergent evaluation or imaging.  UA is abnormal and consistent with likely infection.  We will start Bactrim DS for 5 days.  Patient was instructed to stop the medication and be seen immediately if she has any rash or oral lesion.  We will send for culture and contact her if we need to change her  antibiotics based on susceptibilities identified on culture.  Recommend that she follow-up closely with her primary care and urologist.  Discussed that if her symptoms worsen in any way and she develops severe abdominal pain, fever, back pain, nausea/vomiting interfere with oral intake she needs to go to the emergency room.  Strict return precautions given.  Work excuse note provided.  Final Clinical Impressions(s) / UC Diagnoses   Final diagnoses:  Acute cystitis with hematuria     Discharge Instructions      We are going to start you on an antibiotic to cover for infection.  Take Bactrim DS twice daily.  If you develop any rash or oral lesions you need to stop the medication to be seen immediately.  Make sure you are drinking plenty of fluid.  Follow-up closely with your primary care/urologist.  We will contact you if we need to change antibiotic based on your culture.  If anything changes or worsens including increased pain, fever, nausea/vomiting, blood in your urine, weakness you need to go to the emergency room immediately.     ED Prescriptions     Medication Sig Dispense Auth. Provider   sulfamethoxazole-trimethoprim (BACTRIM DS) 800-160 MG tablet Take 1 tablet by mouth 2 (two) times daily for 5 days. 10 tablet Taydon Nasworthy, Derry Skill, PA-C      PDMP not reviewed this encounter.   Terrilee Croak, PA-C 05/20/22 1108

## 2022-05-20 NOTE — Progress Notes (Signed)
Virtual Visit Consent   Kimberly Armstrong, you are scheduled for a virtual visit with a [facility] provider today. Just as with appointments in the office, your consent must be obtained to participate. Your consent will be active for this visit and any virtual visit you may have with one of our providers in the next 365 days. If you have a MyChart account, a copy of this consent can be sent to you electronically.  As this is a virtual visit, video technology does not allow for your provider to perform a traditional examination. This may limit your provider's ability to fully assess your condition. If your provider identifies any concerns that need to be evaluated in person or the need to arrange testing (such as labs, EKG, etc.), we will make arrangements to do so. Although advances in technology are sophisticated, we cannot ensure that it will always work on either your end or our end. If the connection with a video visit is poor, the visit may have to be switched to a telephone visit. With either a video or telephone visit, we are not always able to ensure that we have a secure connection.  By engaging in this virtual visit, you consent to the provision of healthcare and authorize for your insurance to be billed (if applicable) for the services provided during this visit. Depending on your insurance coverage, you may receive a charge related to this service.  I need to obtain your verbal consent now. Are you willing to proceed with your visit today? Kimberly Armstrong has provided verbal consent on 05/20/2022 for a virtual visit (video or telephone). Kimberly Armstrong, [place]  Date: 05/20/2022 8:27 AM  Virtual Visit via Video Note   I, Kimberly Armstrong, connected with  Kimberly Armstrong  ([id number], 1980/12/09) on 05/20/22 at  8:15 AM EDT by a video-enabled telemedicine application and verified that I am speaking with the correct person using two  identifiers.  Location: Patient: Virtual Visit Location Patient: Home Provider: Virtual Visit Location Provider: Home Office   I discussed the limitations of evaluation and management by telemedicine and the availability of in person appointments. The patient expressed understanding and agreed to proceed.    History of Present Illness: Kimberly Armstrong is a 41 y.o. who identifies as a female who was assigned female at birth, and is being seen today for R lower back and flank pain starting early this morning associated with fatigue and swelling of lower extremities. Denies trauma or injury. Denies fever, chills. Has some chronic suprapubic and pelvic pain that she is followed by Urology for with ongoing workup. Thus far has been diagnosed with incomplete bladder emptying. Denies hematuria, urgency, frequency. Still with some chronic pain with voiding. Pain is dull and constant but sometimes with episodes of sharp pain, often with deep breaths. [name]   HPI: HPI  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   Tobacco use disorder, continuous 10/05/2021   Raynaud's phenomenon without gangrene 10/05/2021   Generalized abdominal tenderness without rebound tenderness 09/07/2021   Seasonal allergies 09/07/2021   Acute left-sided low back pain without sciatica 09/07/2021   Cold sore 09/07/2021   Encounter for well woman exam with routine gynecological exam 03/15/2021   Neck muscle spasm 03/15/2021   Mild intermittent asthma without complication  03/15/2021   Body mass index (BMI) of 33.0-33.9 in adult 03/15/2021   Screening for STD (sexually transmitted disease) 03/15/2021   Need for Tdap vaccination 03/15/2021   Encounter to establish care 12/03/2020   Chronic midline low back pain with bilateral sciatica 12/03/2020    Moderate cigarette smoker 12/03/2020   Encounter for screening mammogram for breast cancer 12/03/2020   Asthma [birth year]    Allergies: No Known Allergies Medications:  Current Outpatient Medications:    baclofen (LIORESAL) 10 MG tablet, TAKE 1 TABLET BY MOUTH THREE TIMES A DAY, Disp: 90 tablet, Rfl: 1   budesonide-formoterol (SYMBICORT) 160-4.5 MCG/ACT inhaler, Inhale 2 puffs into the lungs 2 (two) times daily., Disp: 3 each, Rfl: 1   cetirizine (ZYRTEC) 10 MG tablet, Take 1 tablet (10 mg total) by mouth daily., Disp: 90 tablet, Rfl: 1   diclofenac (VOLTAREN) 75 MG EC tablet, TAKE 1 TABLET BY MOUTH TWICE A DAY, Disp: 60 tablet, Rfl: 2   dicyclomine (BENTYL) 20 MG tablet, Take 1 tablet (20 mg total) by mouth 2 (two) times daily. (Patient not taking: Reported on 04/27/2022), Disp: 20 tablet, Rfl: 0   fluticasone (FLONASE) 50 MCG/ACT nasal spray, SPRAY 2 SPRAYS INTO EACH NOSTRIL EVERY DAY, Disp: 16 mL, Rfl: 3   ibuprofen (ADVIL) 600 MG tablet, Take 1 tablet (600 mg total) by mouth every 8 (eight) hours as needed., Disp: 30 tablet, Rfl: 0   montelukast (SINGULAIR) 10 MG tablet, Take 1 tablet (10 mg total) by mouth at bedtime., Disp: 90 tablet, Rfl: 3   mupirocin ointment (BACTROBAN) 2 %, Apply 1 Application topically daily., Disp: 22 g, Rfl: 11   phenazopyridine (PYRIDIUM) 200 MG tablet, Take 1 tablet (200 mg total) by mouth 3 (three) times daily as needed for pain. (Patient not taking: Reported on 04/27/2022), Disp: 10 tablet, Rfl: 0   polyethylene glycol (MIRALAX / GLYCOLAX) 17 g packet, Take 17 g by mouth as needed. (Patient not taking: Reported on 04/27/2022), Disp: , Rfl:    triamcinolone ointment (KENALOG) 0.5 %, Apply small amount to effected areas bid for itching, Disp: 30 g, Rfl: 0   VENTOLIN HFA 108 (90 Base) MCG/ACT inhaler, TAKE 2 PUFFS BY MOUTH EVERY 6 HOURS AS NEEDED FOR WHEEZE OR SHORTNESS OF BREATH, Disp: 18 each, Rfl: 2  Observations/Objective: Patient is well-developed,  well-nourished in no acute distress.  Resting comfortably at home.  Head is normocephalic, atraumatic.  No labored breathing. Speech is clear and coherent with logical content.  Patient is alert and oriented at baseline.   Assessment and Plan: 1. Right flank pain  2. Other fatigue  Needs in-person eval. Recommend she contact PCP and Urologist first for assessment. If unavailable, nearest urgent care. ER precautions reviewed. No charge due to need for in-person evaluation.   Follow Up Instructions: I discussed the assessment and treatment plan with the patient. The patient was provided an opportunity to ask questions and all were answered. The patient agreed with the plan and demonstrated an understanding of the instructions.  A copy of instructions were sent to the patient via MyChart unless otherwise noted below.   The patient was advised to call back or seek an in-person evaluation if the symptoms worsen or if the condition fails to improve as anticipated.  Time:  I spent 10 minutes with the patient via telehealth technology discussing the above problems/concerns.    Kimberly Rio, PA-C

## 2022-05-20 NOTE — ED Triage Notes (Signed)
Pt here for right sided back pain that she thinks could be a kidney infection x 2 days; pt sts swelling in left knee and bilateral ankles yesterday that is now resolved

## 2022-05-21 ENCOUNTER — Ambulatory Visit: Payer: Managed Care, Other (non HMO) | Attending: Urology

## 2022-05-21 DIAGNOSIS — M6281 Muscle weakness (generalized): Secondary | ICD-10-CM | POA: Insufficient documentation

## 2022-05-21 DIAGNOSIS — R339 Retention of urine, unspecified: Secondary | ICD-10-CM | POA: Diagnosis not present

## 2022-05-21 DIAGNOSIS — R102 Pelvic and perineal pain: Secondary | ICD-10-CM | POA: Insufficient documentation

## 2022-05-21 DIAGNOSIS — M6289 Other specified disorders of muscle: Secondary | ICD-10-CM | POA: Diagnosis present

## 2022-05-21 DIAGNOSIS — R278 Other lack of coordination: Secondary | ICD-10-CM | POA: Insufficient documentation

## 2022-05-21 LAB — URINE CULTURE: Culture: NO GROWTH

## 2022-05-21 NOTE — Therapy (Signed)
OUTPATIENT PHYSICAL THERAPY FEMALE PELVIC EVALUATION   Patient Name: Kimberly Armstrong MRN: [id number] DOB:01/01/1981, 41 y.o., female Today's Date: 05/21/2022   PT End of Session - 05/21/22 0808     Visit Number 1    Number of Visits 10    Date for PT Re-Evaluation 07/30/22    Authorization Type IE: 05/21/22    PT Start Time 0810    PT Stop Time 0845    PT Time Calculation (min) 35 min    Activity Tolerance Patient tolerated treatment well             Past Medical History:  Diagnosis Date   Asthma    Past Surgical History:  Procedure Laterality Date   ABDOMINAL HYSTERECTOMY N/A    [facility] 11/30/2020   BACK SURGERY     carpel tunnel     CESAREAN SECTION     CESAREAN SECTION N/A    [facility] 11/30/2020   ENDOMETRIAL ABLATION     EYE SURGERY N/A    [facility] 11/30/2020   PARTIAL HYSTERECTOMY     SPINE SURGERY N/A    [facility] 11/30/2020   TUBAL LIGATION N/A    [facility] 11/30/2020   Patient Active Problem List   Diagnosis Date Noted   Pelvic pain in female 11/17/2021   Abdominal pain in female 11/17/2021   Renal mass, left 10/05/2021   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   Tobacco use disorder, continuous 10/05/2021   Raynaud's phenomenon without gangrene 10/05/2021   Generalized abdominal tenderness without rebound tenderness 09/07/2021   Seasonal allergies 09/07/2021   Acute left-sided low back pain without sciatica 09/07/2021   Cold sore 09/07/2021   Encounter for well woman exam with routine gynecological exam 03/15/2021   Neck muscle spasm 03/15/2021   Mild intermittent asthma without complication 46/96/2952   Body mass index (BMI) of 33.0-33.9 in adult 03/15/2021   Screening for STD (sexually transmitted disease) 03/15/2021   Need for Tdap vaccination 03/15/2021   Encounter to establish care 12/03/2020   Chronic midline low back pain with bilateral sciatica 12/03/2020   Moderate cigarette smoker 12/03/2020   Encounter for  screening mammogram for breast cancer 12/03/2020   Asthma Jul 21, 1981    PCP: Ronnell Freshwater, NP  REFERRING PROVIDER: Hollice Espy, MD  REFERRING DIAG:  R33.9 (ICD-10-CM) - Incomplete bladder emptying  R10.2 (ICD-10-CM) - Pelvic pain in female  M62.89 (ICD-10-CM) - Pelvic floor dysfunction in female   THERAPY DIAG:  Pelvic floor dysfunction  Other lack of coordination  Pelvic pain  Muscle weakness (generalized)  Rationale for Evaluation and Treatment: Rehabilitation  ONSET DATE: 2 years   RED FLAGS: N/A Have you had any night sweats? Unexplained weight loss? Saddle anesthesia? Unexplained changes in bowel or bladder habits?   SUBJECTIVE: Patient confirms identification and approves PT to assess pelvic floor and treatment Yes  PRECAUTIONS: None  WEIGHT BEARING RESTRICTIONS: No  FALLS:  Has patient fallen in last 6 months? 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 Pt went to the urgent care on 05/20/22 and dx with kidney infection. Pt is currently on antibiotics. This is the second time she has had a kidney infection. Pt notices when she initiates urination it creates a cramping and occasional sharp feeling in the lower abdomen. Pt also feels that BMs are at times difficult to release. This happens intermittently and Pt has tried to change her diet to help emptying bowels/bladder. These episodes are now happening about every week now which is different from every month.    PAIN:  Are you  having pain? Yes NPRS scale: 2/10 (current), 9/10 (worst)  Pain location: Deep  Pain type: dull and sharp Pain description: sharp, dull, and stabbing   Aggravating factors: Pt cannot think of anything Relieving factors: rest   PATIENT GOALS: will assess next visit due to time constraints    UROLOGICAL HISTORY Fluid intake: Yes   Pain with urination: Yes; 95% Fully empty bladder: No Stream: Strong Urgency: Yes Frequency: 6-8x Nocturia: every hour  Leakage: Coughing, Sneezing, and Laughing Pads: No Bladder control (0-10): 5/10  GASTROINTESTINAL HISTORY Deferred 2/2 time constraints  Pain with bowel movement:  Type of bowel movement: Frequency:  Fully empty rectum:    SEXUAL HISTORY/FUNCTION Pain with intercourse: Initial Penetration and During Penetration Ability to have vaginal penetration:  Yes: occasional pain Able to achieve orgasm?: Yes, occasional pain too   OBSTETRICAL HISTORY Vaginal deliveries: G4P3 C-section deliveries: 3 c-sections Currently pregnant: No  GYNECOLOGICAL HISTORY Hysterectomy: yes, abdominal hysterectomy  Pelvic Organ Prolapse: None Pain with exam: yes no Heaviness/pressure: no   OBJECTIVE:    COGNITION: Overall cognitive status: Within functional limits for tasks assessed     POSTURE:  Grossly  Lumbar lordosis:  Deferred 2/2 time constraints  Thoracic kyphosis: Iliac crest height:  Lumbar lateral shift:  Pelvic obliquity:  Leg length discrepancy:   GAIT: Deferred 2/2 time constraints  Distance walked:  Assistive device utilized:  Level of assistance:  Comments:   Trendelenburg:   SENSATION: Deferred 2/2 time constraints  Light touch: , L2-S2 dermatomes  Proprioception:    RANGE OF MOTION:  Deferred 2/2 time constraints   (Norm range in degrees)  LEFT  RIGHT   Lumbar forward flexion (65):      Lumbar extension (30):     Lumbar lateral flexion (25):     Thoracic and Lumbar rotation (30 degrees):       Hip  Flexion (0-125):      Hip IR (0-45):     Hip ER (0-45):     Hip Adduction:      Hip Abduction (0-40):     Hip extension (0-15):     (*= pain, Blank rows = not tested)   STRENGTH: MMT  Deferred 2/2 time constraints   RLE  LLE   Hip Flexion    Hip Extension    Hip Abduction     Hip Adduction     Hip ER     Hip IR     Knee Extension    Knee Flexion  Dorsiflexion     Plantarflexion (seated)    (*= pain, Blank rows = not tested)   SPECIAL TESTS: Deferred 2/2 time constraints  Centralization and Peripheralization (SN 92, -LR 0.12):  Slump (SN 83, -LR 0.32):  SLR (SN 92, -LR 0.29): R: Lumbar quadrant (SN 70): R:  FABER (SN 81): FADIR (SN 94):  Hip scour (SN 50):  Thigh Thrust (SN 88, -LR 0.18) : Distraction (WG95):  Compression (SN/SP 69): Stork/March (SP 93):   PHYSICAL PERFORMANCE MEASURES: Deferred 2/2 time constraints   STS:  RLE SLS:  LLE SLS:  6 MWT:  10MWT:  5TSTS:   PALPATION: Deferred 2/2 time constraints  Abdominal:  Diastasis:  finger above umbilicus,  fingers at and below umbilicus  Scar mobility: present/mobile perpendicular, parallel Rib flare: present/absent  EXTERNAL PELVIC EXAM: Patient educated on the purpose of the pelvic exam and articulated understanding; patient consented to the exam verbally. Deferred 2/2 time constraints  Palpation: Breath coordination: present/absent/inconsistent Voluntary Contraction: present/absent Relaxation: full/delayed/non-relaxing Perineal movement with sustained IAP increase ("bear down"): descent/no change/elevation/excessive descent Perineal movement with rapid IAP increase ("cough"): elevation/no change/descent Pubic symphysis: (0= no contraction, 1= flicker, 2= weak squeeze, 3= fair squeeze with lift, 4= good squeeze and lift against resistance, 5= strong squeeze against strong resistance)   INTERNAL PELVIC EXAM: Patient educated on the purpose of the pelvic exam and articulated understanding; patient  consented to the exam verbally. Deferred 2/2 to time constraints Introitus Appears:  Skin integrity:  Scar mobility: Strength (PERF):  Symmetry: Palpation: Prolapse: (0= no contraction, 1= flicker, 2= weak squeeze, 3= fair squeeze with lift, 4= good squeeze and lift against resistance, 5= strong squeeze against strong resistance)    Patient Education:  Patient educated on what to expect during course of physical therapy, POC, and provided with HEP including: toileting posture with emphasis on diaphragmatic breathing (not chest breathing). Patient verbalized understanding and returned demonstration. Patient will benefit from further education in order to maximize compliance and understanding for long-term therapeutic gains.   Patient Surveys:  FOTO Urinary Problem - 39  FOTO Bowel Constipation - 52     ASSESSMENT:  Clinical Impression: Patient is a 41 y.o. who was seen today for physical therapy evaluation and treatment for a chief concern of pelvic pain. Today's evaluation suggest deficits in IAP management, PFM coordination, PFM strength, PFM extensibility, pain, scar mobility, and sleep hygiene as evidenced by current 2/10 (NPRS) pelvic pain described as dull, worst pelvic pain 9/10 described as sharp/cramping, pain and discomfort with urination about 95% of the time in a week, occasional difficulty with emptying bowels, urinary leakage with sneezing/coughing/laughing, pelvic pain with penetrative sex, hx of 3 c-sections and an abdominal hysterectomy, and nocturia (every hour to urinate). Patient's responses on FOTO Urinary Problem (62) and Bowel Constipation (52) indicates moderate limitation/disability/distress. Patient's progress may be limited due to time since onset; however, patient's motivation is advantageous. Pt with basic understanding of PFM function in bowel/bladder habits, sexual function, posture, and breathing mechanics. Patient will benefit from skilled therapeutic  intervention to address deficits in IAP management, PFM coordination, PFM strength, PFM extensibility, pain, scar mobility, and sleep hygiene in order to increase PLOF and improve overall QOL.    Objective Impairments: decreased coordination, decreased strength, increased fascial restrictions, increased muscle spasms, and pain.   Activity Limitations: sleeping, continence, toileting, and locomotion level  Personal Factors: Age, Behavior pattern, Past/current experiences, Time since onset of injury/illness/exacerbation, and 1-2 comorbidities: hx of endometriosis, asthma  are also affecting patient's  functional outcome.   Rehab Potential: Good  Clinical Decision Making: Evolving/moderate complexity  Evaluation Complexity: Moderate   GOALS: Goals reviewed with patient? Yes  SHORT TERM GOALS: Target date: 06/25/2022  Patient will demonstrate independent and coordinated diaphragmatic breathing in supine with a 1:2 breathing pattern for improved down-regulation of the nervous system and improved management of intra-abdominal pressures in order to increase function at home and in the community. Baseline: will assess next visit  Goal status: INITIAL    LONG TERM GOALS: Target date: 07/30/2022   Patient will score  >/= 68 and 57 on FOTO Urinary Problem and Bowel Constipation  in order to demonstrate improved IAP management, decreased pain, improved PFM coordination, and overall improved QOL.  Baseline: 62 and 52 Goal status: INITIAL  2.  Patient will decrease worst pain as reported on NPRS by at least 2 points to demonstrate clinically significant reduction in pain in order to restore/improve function and overall QOL. Baseline: 9/10 pelvic pain  Goal status: INITIAL  3.  Patient will report a decrease in painful/uncomfortable bladder emptying to 50% or less over the course of a week in order to demonstrate improved PFM coordination, improved PFM extensibility, and overall improved ability  to eliminate urine/feces without limitation or pain.  Baseline: 95% or more in a week Goal status: INITIAL  4.  Patient will report less than 5 incidents of stress urinary incontinence over the course of 3 weeks while coughing/sneezing/laughing in order to demonstrate improved PFM coordination, strength, and function for improved overall QOL. Baseline: dribbling with all the above and has to change undergarments from time to time Goal status: INITIAL  5.  Patient will report a decrease in voiding intervals during the night in order to demonstrate improved control of the bladder, PFM coordination, sleep quality, and overall QOL.  Baseline: every hour during the night Goal status: INITIAL   PLAN: PT Frequency: 1x/week  PT Duration: 10 weeks  Planned Interventions: Therapeutic exercises, Therapeutic activity, Neuromuscular re-education, Balance training, Gait training, Patient/Family education, Self Care, Joint mobilization, Spinal mobilization, Cryotherapy, Moist heat, scar mobilization, Taping, and Manual therapy  Plan For Next Session: phy assess, c-section!   Barry Faircloth, PT, DPT  05/21/2022, 9:07 AM

## 2022-05-24 ENCOUNTER — Ambulatory Visit: Payer: Managed Care, Other (non HMO)

## 2022-05-24 ENCOUNTER — Other Ambulatory Visit: Payer: Self-pay | Admitting: Nurse Practitioner

## 2022-05-24 ENCOUNTER — Telehealth: Payer: Self-pay

## 2022-05-24 DIAGNOSIS — L239 Allergic contact dermatitis, unspecified cause: Secondary | ICD-10-CM

## 2022-05-24 MED ORDER — METHYLPREDNISOLONE 4 MG PO TBPK: ORAL_TABLET | ORAL | 0 refills | Status: DC

## 2022-05-24 NOTE — Progress Notes (Signed)
Patient having allergic reaction to sulfa antibiotic. Will have her stop. Begin medrol taper. Take as directed. Recommend she also take OTC benadryl and pepcid as needed. She needs to schedule appointment or be seen in ER if it becomes difficult for her to breath or tongue swells up.

## 2022-05-24 NOTE — Telephone Encounter (Signed)
Patient called office about an antibiotic sulfamethoxazole that she has gotten from Urgent Care, that has her broken out and itching, patient thinks she is having a allergic reaction to the medication,

## 2022-05-24 NOTE — Telephone Encounter (Signed)
Please suggest that she is allergic to Sulfa.I would have her stop this medication.  Begin medrol taper. Take as directed. Recommend she also take OTC benadryl and pepcid as needed. She needs to schedule appointment or be seen in ER if it becomes difficult for her to breath or tongue swells up.  Thanks so much.   -HB

## 2022-05-26 ENCOUNTER — Ambulatory Visit
Admission: EM | Admit: 2022-05-26 | Discharge: 2022-05-26 | Disposition: A | Discharge status: Home / Self Care | Payer: Managed Care, Other (non HMO) | Attending: Physician Assistant | Admitting: Physician Assistant

## 2022-05-26 DIAGNOSIS — T7840XA Allergy, unspecified, initial encounter: Secondary | ICD-10-CM | POA: Diagnosis not present

## 2022-05-26 DIAGNOSIS — L299 Pruritus, unspecified: Secondary | ICD-10-CM | POA: Diagnosis not present

## 2022-05-26 MED ORDER — FAMOTIDINE 20 MG PO TABS: 20.0000 mg | ORAL_TABLET | Freq: Two times a day (BID) | ORAL | 0 refills | Status: DC

## 2022-05-26 MED ORDER — HYDROXYZINE HCL 10 MG PO TABS: 10.0000 mg | ORAL_TABLET | Freq: Three times a day (TID) | ORAL | 0 refills | Status: DC | PRN

## 2022-05-26 NOTE — ED Provider Notes (Signed)
[name]    [name]: [id number] Arrival date & time: 05/26/22  7902      History   Chief Complaint Chief Complaint  Patient presents with   Allergic Reaction    Allergic reaction from antibiotics, prescribed new medication, still experiencing side effects - Entered by patient    HPI Kimberly Armstrong is a 41 y.o. female.   41 year old female presents with rash and hives.  Patient indicates that she was seen on 921 and treated for a urinary tract infection.  She relates that she was given Bactrim DS as the treatment.  Indicates that after taking the medication for 3 days she started breaking out in hives and rash all over.  She indicates she called her PCP who advised her to stop the medication and methylprednisolone was called in for her to start in order to counteract the reaction.  Patient comes in today indicating that her rash has improved but it is still present, it breaks out intermittently.  She continues to have rash and itching along the arms, legs, and anterior chest wall.  She relates that she does not have any shortness of breath, or difficulty swallowing.  Does indicate that last night she had some mild wheezing although she is a smoker.  Did use her albuterol inhaler which improved her symptoms.  She indicates she stopped taking her Claritin because she did not know exactly what was causing her to break out.  She indicates she has not changed any of the foods that she typically eats, indicating there have been no new food products over the past week.  She denies any fever, nausea or vomiting.   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   Tobacco use  disorder, continuous 10/05/2021   Raynaud's phenomenon without gangrene 10/05/2021   Generalized abdominal tenderness without rebound tenderness 09/07/2021   Seasonal allergies 09/07/2021   Acute left-sided low back pain without sciatica 09/07/2021   Cold sore 09/07/2021   Encounter for well woman exam with routine gynecological exam 03/15/2021   Neck muscle spasm 03/15/2021   Mild intermittent asthma without complication 40/97/3532   Body mass index (BMI) of 33.0-33.9 in adult 03/15/2021   Screening for STD (sexually transmitted disease) 03/15/2021   Need for Tdap vaccination 03/15/2021   Encounter to establish care 12/03/2020   Chronic midline low back pain with bilateral sciatica 12/03/2020   Moderate cigarette smoker 12/03/2020   Encounter for screening mammogram for breast cancer 12/03/2020   Asthma 1981-05-21    Past Surgical History:  Procedure Laterality Date   ABDOMINAL HYSTERECTOMY N/A    [employer] 11/30/2020   BACK SURGERY     carpel tunnel     CESAREAN SECTION     CESAREAN SECTION N/A    [employer] 11/30/2020   ENDOMETRIAL ABLATION     EYE SURGERY N/A    [employer] 11/30/2020   PARTIAL HYSTERECTOMY     SPINE SURGERY N/A    [employer] 11/30/2020   TUBAL LIGATION N/A    [employer] 11/30/2020    OB History     Gravida  4   Para  3   Term  3  Preterm  0   AB  1   Living  3      SAB  1   IAB  0   Ectopic  0   Multiple  0   Live Births  3            Home Medications    Prior to Admission medications   Medication Sig Start Date End Date Taking? Authorizing Provider  famotidine (PEPCID) 20 MG tablet Take 1 tablet (20 mg total) by mouth 2 (two) times daily. To reduce itching and hives. 05/26/22  Yes Nyoka Lint, PA-C  hydrOXYzine (ATARAX) 10 MG tablet Take 1 tablet (10 mg total) by mouth 3 (three) times daily as needed. For itching and rash. 05/26/22  Yes Nyoka Lint, PA-C  baclofen (LIORESAL) 10 MG tablet TAKE 1 TABLET BY MOUTH THREE TIMES A DAY  12/14/21   Ronnell Freshwater, NP  budesonide-formoterol (SYMBICORT) 160-4.5 MCG/ACT inhaler Inhale 2 puffs into the lungs 2 (two) times daily. 09/07/21   Ronnell Freshwater, NP  cetirizine (ZYRTEC) 10 MG tablet Take 1 tablet (10 mg total) by mouth daily. 09/07/21   Ronnell Freshwater, NP  diclofenac (VOLTAREN) 75 MG EC tablet TAKE 1 TABLET BY MOUTH TWICE A DAY 01/14/22   Ronnell Freshwater, NP  dicyclomine (BENTYL) 20 MG tablet Take 1 tablet (20 mg total) by mouth 2 (two) times daily. Patient not taking: Reported on 04/27/2022 03/22/22   Palumbo, April, MD  fluticasone [facility]) 50 MCG/ACT nasal spray SPRAY 2 SPRAYS INTO EACH NOSTRIL EVERY DAY 09/07/21   Ronnell Freshwater, NP  ibuprofen (ADVIL) 600 MG tablet Take 1 tablet (600 mg total) by mouth every 8 (eight) hours as needed. 11/13/21   Tempie Hoist, FNP  methylPREDNISolone (MEDROL) 4 MG TBPK tablet Take by mouth as directed for 6 days 05/24/22   Ronnell Freshwater, NP  montelukast (SINGULAIR) 10 MG tablet Take 1 tablet (10 mg total) by mouth at bedtime. 09/07/21   Ronnell Freshwater, NP  mupirocin ointment (BACTROBAN) 2 % Apply 1 Application topically daily. Patient not taking: Reported on 05/21/2022 03/25/22   Warren Danes, PA-C  phenazopyridine (PYRIDIUM) 200 MG tablet Take 1 tablet (200 mg total) by mouth 3 (three) times daily as needed for pain. 03/24/22   Mar Daring, PA-C  polyethylene glycol (MIRALAX / GLYCOLAX) 17 g packet Take 17 g by mouth as needed. Patient not taking: Reported on 04/27/2022    [provider]  Sertraline HCl (ZOLOFT PO) Take 10 mg by mouth.    [provider]  triamcinolone ointment (KENALOG) 0.5 % Apply small amount to effected areas bid for itching 04/05/22   Ronnell Freshwater, NP  VENTOLIN HFA 108 (90 Base) MCG/ACT inhaler TAKE 2 PUFFS BY MOUTH EVERY 6 HOURS AS NEEDED FOR WHEEZE OR SHORTNESS OF BREATH 01/14/22   Ronnell Freshwater, NP    Family History Family History  Problem Relation Age of Onset    Breast cancer Mother    Heart attack Mother    Hypertension Mother    Diabetes Sister    Breast cancer Maternal Grandmother    Dementia Paternal Grandmother    Colon cancer Neg Hx    Esophageal cancer Neg Hx    Stomach cancer Neg Hx    Colon polyps Neg Hx    Pancreatic cancer Neg Hx     Social History Social History   Tobacco Use   Smoking status: Every Day  Packs/day: 1.00    Types: Cigarettes    Start date: 12/03/2000   Smokeless tobacco: Never  Vaping Use   Vaping Use: Never used  Substance Use Topics   Alcohol use: Yes    Alcohol/week: 2.0 standard drinks of alcohol    Types: 2 Glasses of wine per week   Drug use: Never     Allergies   Sulfa antibiotics   Review of Systems Review of Systems  Skin:  Positive for rash (hives all over and itching).     Physical Exam Triage Vital Signs ED Triage Vitals  Enc Vitals Group     BP 05/26/22 0830 138/80     Pulse Rate 05/26/22 0830 76     Resp 05/26/22 0830 16     Temp 05/26/22 0830 98.5 F (36.9 C)     Temp Source 05/26/22 0830 Oral     SpO2 05/26/22 0830 97 %     Weight --      Height --      Head Circumference --      Peak Flow --      Pain Score 05/26/22 0831 5     Pain Loc --      Pain Edu? --      Excl. in [place]? --    No data found.  Updated Vital Signs BP 138/80 (BP Location: Left Arm)   Pulse 76   Temp 98.5 F (36.9 C) (Oral)   Resp 16   SpO2 97%   Visual Acuity Right Eye Distance:   Left Eye Distance:   Bilateral Distance:    Right Eye Near:   Left Eye Near:    Bilateral Near:     Physical Exam Constitutional:      Appearance: Normal appearance.  HENT:     Mouth/Throat:     Mouth: Mucous membranes are moist.     Pharynx: Oropharynx is clear. No pharyngeal swelling.  Cardiovascular:     Rate and Rhythm: Normal rate and regular rhythm.  Pulmonary:     Effort: Pulmonary effort is normal.     Breath sounds: Normal breath sounds and air entry. No wheezing, rhonchi or rales.   Lymphadenopathy:     Cervical: No cervical adenopathy.  Skin:         Comments: Skin: The hives have mostly resolved all over the body.  She does have some residual redness on the mid anterior chest wall which is flat but there is some evidence of excoriations where she has been itching.  No other abnormalities are noted on the skin.  Neurological:     Mental Status: She is alert.      UC Treatments / Results  Labs (all labs ordered are listed, but only abnormal results are displayed) Labs Reviewed - No data to display  EKG   Radiology No results found.  Procedures Procedures (including critical care time)  Medications Ordered in UC Medications - No data to display  Initial Impression / Assessment and Plan / UC Course  I have reviewed the triage vital signs and the nursing notes.  Pertinent labs & imaging results that were available during my care of the patient were reviewed by me and considered in my medical decision making (see chart for details).    Plan: 1.  The urticaria which result from the allergic reaction will be treated with Claritin daily, she will continue and complete the methylprednisolone prescription that she was given. 2.  The allergic reaction and urticaria will  be treated additionally with Atarax 10 mg 3-4 times a day to help reduce the rash and itching. 3.  The allergic reaction and rash will be treated with Pepcid 20 mg twice daily in order to reduce the itching and hives. 4.  Patient advised to continue taking Claritin on a daily basis to help reduce the itching and the rash. .  Patient advised to follow-up with PCP or return if symptoms fail to improve. Final Clinical Impressions(s) / UC Diagnoses   Final diagnoses:  Allergic reaction, initial encounter  Itching     Discharge Instructions      Advised to continue taking the methylprednisolone until completed to help reduce the rash and hives. Advised to restart the Claritin daily in  order to control the itching and hives. Advised to take the hydroxyzine 10 mg 3-4 times a day in order to help reduce the itching and hives. Advised to take Pepcid 20 mg twice daily to help resolve the hives and itching. Consider yourself allergic to sulfa drugs, and avoid their use in the future as this may cause the hives to recur if exposed to this type of medicine again. Advised follow-up PCP or return to urgent care if symptoms fail to improve.    ED Prescriptions     Medication Sig Dispense Auth. Provider   famotidine (PEPCID) 20 MG tablet Take 1 tablet (20 mg total) by mouth 2 (two) times daily. To reduce itching and hives. 30 tablet Nyoka Lint, PA-C   hydrOXYzine (ATARAX) 10 MG tablet Take 1 tablet (10 mg total) by mouth 3 (three) times daily as needed. For itching and rash. 30 tablet Nyoka Lint, PA-C      PDMP not reviewed this encounter.   Nyoka Lint, PA-C 05/26/22 [phone]

## 2022-05-26 NOTE — ED Triage Notes (Signed)
Pt reports being tx for a kidney infection last Friday. States sat and sun she began having hives after taking medications. Contacted pcp and given steroids. States it has helped but continuing to have sxs and wants to make sure this is an allergic rxn and she is getting the right tx.   Needs provider note for work as well.

## 2022-05-26 NOTE — Discharge Instructions (Addendum)
Advised to continue taking the methylprednisolone until completed to help reduce the rash and hives. Advised to restart the Claritin daily in order to control the itching and hives. Advised to take the hydroxyzine 10 mg 3-4 times a day in order to help reduce the itching and hives. Advised to take Pepcid 20 mg twice daily to help resolve the hives and itching. Consider yourself allergic to sulfa drugs, and avoid their use in the future as this may cause the hives to recur if exposed to this type of medicine again. Advised follow-up PCP or return to urgent care if symptoms fail to improve.

## 2022-05-28 ENCOUNTER — Telehealth: Payer: Managed Care, Other (non HMO) | Admitting: Physician Assistant

## 2022-05-28 DIAGNOSIS — N3000 Acute cystitis without hematuria: Secondary | ICD-10-CM

## 2022-05-28 MED ORDER — CEPHALEXIN 500 MG PO CAPS: 500.0000 mg | ORAL_CAPSULE | Freq: Two times a day (BID) | ORAL | 0 refills | Status: DC

## 2022-05-28 NOTE — Patient Instructions (Signed)
Kimberly Armstrong, thank you for joining Mar Daring, PA-C for today's virtual visit.  While this provider is not your primary care provider (PCP), if your PCP is located in our provider database this encounter information will be shared with them immediately following your visit.  Consent: (Patient) Kimberly Armstrong provided verbal consent for this virtual visit at the beginning of the encounter.  Current Medications:  Current Outpatient Medications:    cephALEXin (KEFLEX) 500 MG capsule, Take 1 capsule (500 mg total) by mouth 2 (two) times daily., Disp: 14 capsule, Rfl: 0   baclofen (LIORESAL) 10 MG tablet, TAKE 1 TABLET BY MOUTH THREE TIMES A DAY, Disp: 90 tablet, Rfl: 1   budesonide-formoterol (SYMBICORT) 160-4.5 MCG/ACT inhaler, Inhale 2 puffs into the lungs 2 (two) times daily., Disp: 3 each, Rfl: 1   cetirizine (ZYRTEC) 10 MG tablet, Take 1 tablet (10 mg total) by mouth daily., Disp: 90 tablet, Rfl: 1   diclofenac (VOLTAREN) 75 MG EC tablet, TAKE 1 TABLET BY MOUTH TWICE A DAY, Disp: 60 tablet, Rfl: 2   dicyclomine (BENTYL) 20 MG tablet, Take 1 tablet (20 mg total) by mouth 2 (two) times daily. (Patient not taking: Reported on 04/27/2022), Disp: 20 tablet, Rfl: 0   famotidine (PEPCID) 20 MG tablet, Take 1 tablet (20 mg total) by mouth 2 (two) times daily. To reduce itching and hives., Disp: 30 tablet, Rfl: 0   fluticasone (FLONASE) 50 MCG/ACT nasal spray, SPRAY 2 SPRAYS INTO EACH NOSTRIL EVERY DAY, Disp: 16 mL, Rfl: 3   hydrOXYzine (ATARAX) 10 MG tablet, Take 1 tablet (10 mg total) by mouth 3 (three) times daily as needed. For itching and rash., Disp: 30 tablet, Rfl: 0   ibuprofen (ADVIL) 600 MG tablet, Take 1 tablet (600 mg total) by mouth every 8 (eight) hours as needed., Disp: 30 tablet, Rfl: 0   methylPREDNISolone (MEDROL) 4 MG TBPK tablet, Take by mouth as directed for 6 days, Disp: 21 tablet, Rfl: 0   montelukast (SINGULAIR) 10 MG tablet, Take 1 tablet (10 mg  total) by mouth at bedtime., Disp: 90 tablet, Rfl: 3   mupirocin ointment (BACTROBAN) 2 %, Apply 1 Application topically daily. (Patient not taking: Reported on 05/21/2022), Disp: 22 g, Rfl: 11   phenazopyridine (PYRIDIUM) 200 MG tablet, Take 1 tablet (200 mg total) by mouth 3 (three) times daily as needed for pain., Disp: 10 tablet, Rfl: 0   polyethylene glycol (MIRALAX / GLYCOLAX) 17 g packet, Take 17 g by mouth as needed. (Patient not taking: Reported on 04/27/2022), Disp: , Rfl:    Sertraline HCl (ZOLOFT PO), Take 10 mg by mouth., Disp: , Rfl:    triamcinolone ointment (KENALOG) 0.5 %, Apply small amount to effected areas bid for itching, Disp: 30 g, Rfl: 0   VENTOLIN HFA 108 (90 Base) MCG/ACT inhaler, TAKE 2 PUFFS BY MOUTH EVERY 6 HOURS AS NEEDED FOR WHEEZE OR SHORTNESS OF BREATH, Disp: 18 each, Rfl: 2   Medications ordered in this encounter:  Meds ordered this encounter  Medications   cephALEXin (KEFLEX) 500 MG capsule    Sig: Take 1 capsule (500 mg total) by mouth 2 (two) times daily.    Dispense:  14 capsule    Refill:  0    Order Specific Question:   Supervising Provider    Answer:   Chase Picket [id number]     *If you need refills on other medications prior to your next appointment, please contact your pharmacy*  Follow-Up: Call  back or seek an in-person evaluation if the symptoms worsen or if the condition fails to improve as anticipated.  [place] [phone]  Other Instructions  - Suspect infection was not completely treated previously and starting to become symptomatic again. - Keflex added - Discussed adding OTC claritin (Loratadine) or allegra (fexofenadine) for itching to limit benadryl use since it is causing daytime somnolence and hangover sensation - May get famotidine OTC instead; Can take 20-'40mg'$  twice daily - Luke warm to cool showers - Lotion to avoid dryness - Seek in person evaluation if not improving or if symptoms worsen again   If  you have been instructed to have an in-person evaluation today at a local Urgent Care facility, please use the link below. It will take you to a list of all of our available [facility], including address, phone number and hours of operation. Please do not delay care.  [facility]  If you or a family member do not have a primary care provider, use the link below to schedule a visit and establish care. When you choose a [facility] primary care physician or advanced practice provider, you gain a long-term partner in health. Find a Primary Care Provider  Learn more about [facility]'s in-office and virtual care options: [facility]

## 2022-05-28 NOTE — Progress Notes (Signed)
Virtual Visit Consent   Kimberly Armstrong, you are scheduled for a virtual visit with a [facility] provider today. Just as with appointments in the office, your consent must be obtained to participate. Your consent will be active for this visit and any virtual visit you may have with one of our providers in the next 365 days. If you have a MyChart account, a copy of this consent can be sent to you electronically.  As this is a virtual visit, video technology does not allow for your provider to perform a traditional examination. This may limit your provider's ability to fully assess your condition. If your provider identifies any concerns that need to be evaluated in person or the need to arrange testing (such as labs, EKG, etc.), we will make arrangements to do so. Although advances in technology are sophisticated, we cannot ensure that it will always work on either your end or our end. If the connection with a video visit is poor, the visit may have to be switched to a telephone visit. With either a video or telephone visit, we are not always able to ensure that we have a secure connection.  By engaging in this virtual visit, you consent to the provision of healthcare and authorize for your insurance to be billed (if applicable) for the services provided during this visit. Depending on your insurance coverage, you may receive a charge related to this service.  I need to obtain your verbal consent now. Are you willing to proceed with your visit today? Kimberly Armstrong has provided verbal consent on 05/28/2022 for a virtual visit (video or telephone). Mar Daring, PA-C  Date: 05/28/2022 7:56 AM  Virtual Visit via Video Note   I, Mar Daring, connected with  Kimberly Armstrong  ([id number], Jul 16, 1981) on 05/28/22 at  7:45 AM EDT by a video-enabled telemedicine application and verified that I am speaking with the correct person using two  identifiers.  Location: Patient: Virtual Visit Location Patient: Home Provider: Virtual Visit Location Provider: Home Office   I discussed the limitations of evaluation and management by telemedicine and the availability of in person appointments. The patient expressed understanding and agreed to proceed.    History of Present Illness: Kimberly Armstrong is a 41 y.o. who identifies as a female who was assigned female at birth, and is being seen today for body aches and fatigue. She was seen at a local Urgent Care on 05/20/22 and diagnosed with acute cystitis with hematuria. Started on Bactrim, had symptom improvement. By day 3-4 she started developing a hive-like rash. She called her PCP and was told to stop Bactrim as she may be allergic to sulfa drugs and was prescribed a 6 day taper of methylprednisolone. She started Benadryl OTC. Itching and hives persisted so she went back to Urgent Care on 05/26/22. They advised once an allergic reaction starts symptoms can be present for 10-14 days. They added Hydroxyzine and Famotidine. She does report she has not added these as she does not get paid until 05/04/22 and cannot afford them at this time. She is still having intermittent itching (has improved) and red patches still persist, but there have been no new lesions developing. Denies SOB, swelling. She is starting to have some body aches and fatigue starting back and has developed a low back pain again.     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   Tobacco use disorder, continuous 10/05/2021   Raynaud's phenomenon without gangrene 10/05/2021   Generalized abdominal tenderness without rebound tenderness 09/07/2021   Seasonal allergies 09/07/2021   Acute left-sided low back pain without sciatica 09/07/2021   Cold  sore 09/07/2021   Encounter for well woman exam with routine gynecological exam 03/15/2021   Neck muscle spasm 03/15/2021   Mild intermittent asthma without complication 38/05/1750   Body mass index (BMI) of 33.0-33.9 in adult 03/15/2021   Screening for STD (sexually transmitted disease) 03/15/2021   Need for Tdap vaccination 03/15/2021   Encounter to establish care 12/03/2020   Chronic midline low back pain with bilateral sciatica 12/03/2020   Moderate cigarette smoker 12/03/2020   Encounter for screening mammogram for breast cancer 12/03/2020   Asthma 05-08-1981    Allergies:  Allergies  Allergen Reactions   Sulfa Antibiotics Rash    Hives   Medications:  Current Outpatient Medications:    baclofen (LIORESAL) 10 MG tablet, TAKE 1 TABLET BY MOUTH THREE TIMES A DAY, Disp: 90 tablet, Rfl: 1   budesonide-formoterol (SYMBICORT) 160-4.5 MCG/ACT inhaler, Inhale 2 puffs into the lungs 2 (two) times daily., Disp: 3 each, Rfl: 1   cephALEXin (KEFLEX) 500 MG capsule, Take 1 capsule (500 mg total) by mouth 2 (two) times daily., Disp: 14 capsule, Rfl: 0   cetirizine (ZYRTEC) 10 MG tablet, Take 1 tablet (10 mg total) by mouth daily., Disp: 90 tablet, Rfl: 1   diclofenac (VOLTAREN) 75 MG EC tablet, TAKE 1 TABLET BY MOUTH TWICE A DAY, Disp: 60 tablet, Rfl: 2   dicyclomine (BENTYL) 20 MG tablet, Take 1 tablet (20 mg total) by mouth 2 (two) times daily. (Patient not taking: Reported on 04/27/2022), Disp: 20 tablet, Rfl: 0   famotidine (PEPCID) 20 MG tablet, Take 1 tablet (20 mg total) by mouth 2 (two) times daily. To reduce itching and hives., Disp: 30 tablet, Rfl: 0   fluticasone (FLONASE) 50 MCG/ACT nasal spray, SPRAY 2 SPRAYS INTO EACH NOSTRIL EVERY DAY, Disp: 16 mL, Rfl: 3   hydrOXYzine (ATARAX) 10 MG tablet, Take 1 tablet (10 mg total) by mouth 3 (three) times daily as needed. For itching and rash., Disp: 30 tablet, Rfl: 0   ibuprofen (ADVIL) 600 MG tablet, Take 1 tablet (600 mg total) by mouth  every 8 (eight) hours as needed., Disp: 30 tablet, Rfl: 0   methylPREDNISolone (MEDROL) 4 MG TBPK tablet, Take by mouth as directed for 6 days, Disp: 21 tablet, Rfl: 0   montelukast (SINGULAIR) 10 MG tablet, Take 1 tablet (10 mg total) by mouth at bedtime., Disp: 90 tablet, Rfl: 3   mupirocin ointment (BACTROBAN) 2 %, Apply 1 Application topically daily. (Patient not taking: Reported on 05/21/2022), Disp: 22 g, Rfl: 11   phenazopyridine (PYRIDIUM) 200 MG tablet, Take 1 tablet (200 mg total) by mouth 3 (three) times daily as needed for pain., Disp: 10 tablet, Rfl: 0   polyethylene glycol (MIRALAX / GLYCOLAX) 17 g packet, Take 17 g by mouth as needed. (Patient not taking: Reported on 04/27/2022), Disp: , Rfl:    Sertraline HCl (ZOLOFT PO), Take 10 mg by mouth., Disp: , Rfl:    triamcinolone ointment (KENALOG) 0.5 %, Apply small amount to effected areas bid for itching, Disp: 30 g, Rfl: 0   VENTOLIN HFA 108 (90 Base) MCG/ACT inhaler, TAKE 2 PUFFS BY MOUTH EVERY 6 HOURS AS  NEEDED FOR WHEEZE OR SHORTNESS OF BREATH, Disp: 18 each, Rfl: 2  Observations/Objective: Patient is well-developed, well-nourished in no acute distress.  Resting comfortably at home.  Head is normocephalic, atraumatic.  No labored breathing.  Speech is clear and coherent with logical content.  Patient is alert and oriented at baseline.    Assessment and Plan: 1. Acute cystitis without hematuria - cephALEXin (KEFLEX) 500 MG capsule; Take 1 capsule (500 mg total) by mouth 2 (two) times daily.  Dispense: 14 capsule; Refill: 0  - Suspect infection was not completely treated previously and starting to become symptomatic again. Possibility of cortisol withdrawal symptoms, but less likely due to taper and still on methylprednisolone (has one day left) - Keflex added - Discussed adding OTC claritin (Loratadine) or allegra (fexofenadine) for itching to limit benadryl use since it is causing daytime somnolence and hangover sensation -  May get famotidine OTC instead; Can take 20-'40mg'$  twice daily - Luke warm to cool showers - Lotion to avoid dryness - Seek in person evaluation if not improving or if symptoms worsen again  Follow Up Instructions: I discussed the assessment and treatment plan with the patient. The patient was provided an opportunity to ask questions and all were answered. The patient agreed with the plan and demonstrated an understanding of the instructions.  A copy of instructions were sent to the patient via MyChart unless otherwise noted below.    The patient was advised to call back or seek an in-person evaluation if the symptoms worsen or if the condition fails to improve as anticipated.  Time:  I spent 12 minutes with the patient via telehealth technology discussing the above problems/concerns.    Mar Daring, PA-C

## 2022-06-01 ENCOUNTER — Ambulatory Visit (INDEPENDENT_AMBULATORY_CARE_PROVIDER_SITE_OTHER): Payer: Managed Care, Other (non HMO) | Admitting: Nurse Practitioner

## 2022-06-01 ENCOUNTER — Encounter: Payer: Self-pay | Admitting: Nurse Practitioner

## 2022-06-01 VITALS — BP 152/98 | HR 77 | Ht 69.0 in | Wt 232.1 lb

## 2022-06-01 DIAGNOSIS — R5383 Other fatigue: Secondary | ICD-10-CM

## 2022-06-01 DIAGNOSIS — R319 Hematuria, unspecified: Secondary | ICD-10-CM

## 2022-06-01 DIAGNOSIS — R5381 Other malaise: Secondary | ICD-10-CM

## 2022-06-01 DIAGNOSIS — N390 Urinary tract infection, site not specified: Secondary | ICD-10-CM | POA: Diagnosis not present

## 2022-06-01 DIAGNOSIS — R102 Pelvic and perineal pain: Secondary | ICD-10-CM | POA: Diagnosis not present

## 2022-06-01 MED ORDER — CEFTRIAXONE SODIUM 1 G IJ SOLR
1.0000 g | Freq: Once | INTRAMUSCULAR | Status: AC
  Administered 2022-06-01: 1 g via INTRAMUSCULAR

## 2022-06-01 NOTE — Progress Notes (Signed)
Established patient visit   Patient: Kimberly Armstrong   DOB: 31-Jan-1981   40 y.o. Female  MRN: [id number] Visit Date: 06/01/2022   Chief Complaint  Patient presents with   Follow-up   Subjective    HPI  Follow up On 9/21 went to urgent care due to kidney infection.  Put on antibiotics which she was allergic to.  Created hives Treated with prednisone.  Went back to urgent care.  Was started on atarax, pepcid, and claritin.  On subsequent video visit on 05/28/2022, she was started on keflex due to cystitis which had not cleared up.  She states that she has not been able to pick up any of the new medications until Friday due to cost and Friday is when she gets paid.  She has not been at work since 05/20/2022 as symptoms have been so severe. She has brought FMLA paperwork to be completed and returned to her as soon as possible to protect her job.e  She has been out since 05/20/2022.  Will give rocephin shot today. She will start oral antibiotics on Friday. She should be able to return to work on Monday 06/07/2022.   Medications: Outpatient Medications Prior to Visit  Medication Sig   baclofen (LIORESAL) 10 MG tablet TAKE 1 TABLET BY MOUTH THREE TIMES A DAY   budesonide-formoterol (SYMBICORT) 160-4.5 MCG/ACT inhaler Inhale 2 puffs into the lungs 2 (two) times daily.   cephALEXin (KEFLEX) 500 MG capsule Take 1 capsule (500 mg total) by mouth 2 (two) times daily.   cetirizine (ZYRTEC) 10 MG tablet Take 1 tablet (10 mg total) by mouth daily.   diclofenac (VOLTAREN) 75 MG EC tablet TAKE 1 TABLET BY MOUTH TWICE A DAY   famotidine (PEPCID) 20 MG tablet Take 1 tablet (20 mg total) by mouth 2 (two) times daily. To reduce itching and hives.   fluticasone (FLONASE) 50 MCG/ACT nasal spray SPRAY 2 SPRAYS INTO EACH NOSTRIL EVERY DAY   hydrOXYzine (ATARAX) 10 MG tablet Take 1 tablet (10 mg total) by mouth 3 (three) times daily as needed. For itching and rash.   ibuprofen (ADVIL) 600 MG tablet  Take 1 tablet (600 mg total) by mouth every 8 (eight) hours as needed.   montelukast (SINGULAIR) 10 MG tablet Take 1 tablet (10 mg total) by mouth at bedtime.   phenazopyridine (PYRIDIUM) 200 MG tablet Take 1 tablet (200 mg total) by mouth 3 (three) times daily as needed for pain.   Sertraline HCl (ZOLOFT PO) Take 10 mg by mouth.   VENTOLIN HFA 108 (90 Base) MCG/ACT inhaler TAKE 2 PUFFS BY MOUTH EVERY 6 HOURS AS NEEDED FOR WHEEZE OR SHORTNESS OF BREATH   [DISCONTINUED] triamcinolone ointment (KENALOG) 0.5 % Apply small amount to effected areas bid for itching   dicyclomine (BENTYL) 20 MG tablet Take 1 tablet (20 mg total) by mouth 2 (two) times daily. (Patient not taking: Reported on 04/27/2022)   mupirocin ointment (BACTROBAN) 2 % Apply 1 Application topically daily. (Patient not taking: Reported on 05/21/2022)   polyethylene glycol (MIRALAX / GLYCOLAX) 17 g packet Take 17 g by mouth as needed. (Patient not taking: Reported on 04/27/2022)   [DISCONTINUED] methylPREDNISolone (MEDROL) 4 MG TBPK tablet Take by mouth as directed for 6 days   No facility-administered medications prior to visit.    Review of Systems  Constitutional:  Positive for appetite change and fatigue. Negative for activity change, chills and fever.       Patient hsa been unable to work since  05/20/2022.   HENT:  Negative for congestion, postnasal drip, rhinorrhea, sinus pressure, sinus pain, sneezing and sore throat.   Eyes: Negative.   Respiratory:  Negative for cough, chest tightness, shortness of breath and wheezing.   Cardiovascular:  Negative for chest pain and palpitations.  Gastrointestinal:  Negative for abdominal pain, constipation, diarrhea, nausea and vomiting.  Endocrine: Negative for cold intolerance, heat intolerance, polydipsia and polyuria.  Genitourinary:  Positive for dysuria, flank pain, genital sores and urgency. Negative for dyspareunia and frequency.  Musculoskeletal:  Positive for back pain. Negative for  arthralgias and myalgias.  Skin:  Negative for rash.  Allergic/Immunologic: Negative for environmental allergies.  Neurological:  Negative for dizziness, weakness and headaches.  Hematological:  Negative for adenopathy.  Psychiatric/Behavioral:  The patient is not nervous/anxious.       Objective     Today's Vitals   06/01/22 0818 06/01/22 0843  BP: (Abnormal) 105/103 (Abnormal) 152/98  Pulse: 77   SpO2: 98%   Weight: 232 lb 1.9 oz (105.3 kg)   Height: $Remove'5\' 9"'gexHmqN$  (1.753 m)    Body mass index is 34.28 kg/m.   BP Readings from Last 3 Encounters:  06/07/22 131/76  06/01/22 (Abnormal) 152/98  05/26/22 138/80    Wt Readings from Last 3 Encounters:  06/07/22 235 lb 1.9 oz (106.6 kg)  06/01/22 232 lb 1.9 oz (105.3 kg)  04/27/22 228 lb (103.4 kg)    Physical Exam Vitals and nursing note reviewed.  Constitutional:      Appearance: Normal appearance. She is well-developed. She is ill-appearing.  HENT:     Head: Normocephalic and atraumatic.     Nose: Nose normal.     Mouth/Throat:     Mouth: Mucous membranes are moist.     Pharynx: Oropharynx is clear.  Eyes:     Conjunctiva/sclera: Conjunctivae normal.     Pupils: Pupils are equal, round, and reactive to light.  Cardiovascular:     Rate and Rhythm: Normal rate and regular rhythm.     Pulses: Normal pulses.     Heart sounds: Normal heart sounds.  Pulmonary:     Effort: Pulmonary effort is normal.     Breath sounds: Normal breath sounds.  Abdominal:     General: Bowel sounds are normal. There is no distension.     Palpations: Abdomen is soft. There is no mass.     Tenderness: There is abdominal tenderness. There is no right CVA tenderness, left CVA tenderness, guarding or rebound.     Hernia: No hernia is present.     Comments: Patinet having suprapubic tenderness with palpation.   Genitourinary:    Comments: Right flank pain with palpation  Musculoskeletal:        General: Normal range of motion.     Cervical back:  Normal range of motion and neck supple.  Lymphadenopathy:     Cervical: No cervical adenopathy.  Skin:    General: Skin is warm and dry.     Capillary Refill: Capillary refill takes less than 2 seconds.  Neurological:     General: No focal deficit present.     Mental Status: She is alert and oriented to person, place, and time.  Psychiatric:        Mood and Affect: Mood normal.        Behavior: Behavior normal.        Thought Content: Thought content normal.        Judgment: Judgment normal.       Assessment &  Plan    1. Urinary tract infection with hematuria, site unspecified Patient given injection ceftriaxone injection during visit. She should continue with keflex until prescription is complete. Ret, increase water intake. FMLA/short-term disability paperwork to be completed and returned to the patient as soon as possible. Disability period will staret 05/20/2022 which is first day she missed due to illness and allow her to go back to work on 06/07/2022.  - cefTRIAXone (ROCEPHIN) injection 1 g  2. Pelvic pain in female Likely due to uti. Treat with antibiotics, rest, and fluids. Use OTC tylenol and/or ibuprofen as needed and as indicated for fever and body aches   3. Malaise and fatigue Rest and increase fluids.    Problem List Items Addressed This Visit       Genitourinary   Urinary tract infection with hematuria - Primary     Other   Pelvic pain in female   Malaise and fatigue     Return for as scheduled.         Ronnell Freshwater, NP  [facility] [phone] (phone) [phone] (fax)  [place]

## 2022-06-04 ENCOUNTER — Ambulatory Visit: Payer: Managed Care, Other (non HMO)

## 2022-06-07 ENCOUNTER — Ambulatory Visit: Payer: Managed Care, Other (non HMO) | Admitting: Nurse Practitioner

## 2022-06-07 ENCOUNTER — Encounter: Payer: Self-pay | Admitting: Nurse Practitioner

## 2022-06-07 VITALS — BP 131/76 | HR 80 | Ht 69.0 in | Wt 235.1 lb

## 2022-06-07 DIAGNOSIS — R319 Hematuria, unspecified: Secondary | ICD-10-CM | POA: Diagnosis not present

## 2022-06-07 DIAGNOSIS — M533 Sacrococcygeal disorders, not elsewhere classified: Secondary | ICD-10-CM | POA: Diagnosis not present

## 2022-06-07 DIAGNOSIS — N390 Urinary tract infection, site not specified: Secondary | ICD-10-CM

## 2022-06-07 DIAGNOSIS — W19XXXA Unspecified fall, initial encounter: Secondary | ICD-10-CM | POA: Diagnosis not present

## 2022-06-07 DIAGNOSIS — M545 Low back pain, unspecified: Secondary | ICD-10-CM | POA: Diagnosis not present

## 2022-06-07 DIAGNOSIS — Y92009 Unspecified place in unspecified non-institutional (private) residence as the place of occurrence of the external cause: Secondary | ICD-10-CM

## 2022-06-07 LAB — POCT URINALYSIS DIP (CLINITEK)
Bilirubin, UA: NEGATIVE
Glucose, UA: NEGATIVE mg/dL
Ketones, POC UA: NEGATIVE mg/dL
Leukocytes, UA: NEGATIVE
Nitrite, UA: NEGATIVE
POC PROTEIN,UA: NEGATIVE
Spec Grav, UA: 1.01 (ref 1.010–1.025)
Urobilinogen, UA: 0.2 E.U./dL
pH, UA: 5.5 (ref 5.0–8.0)

## 2022-06-07 NOTE — Progress Notes (Signed)
Patient is currently on keflex due to uti

## 2022-06-07 NOTE — Progress Notes (Signed)
Established patient visit   Patient: Kimberly Armstrong   DOB: 1981-06-20   42 y.o. Female  MRN: [id number] Visit Date: 06/07/2022  Chief Complaint  Patient presents with   Tailbone Pain   Subjective    Patient fell last night -chair pulled out from under her and she fell onto her tailbone onto hard wood floor.   Back Pain This is a new problem. The current episode started yesterday. The problem occurs constantly. The pain is present in the gluteal. The quality of the pain is described as aching. The pain does not radiate. The pain is at a severity of 7/10. The symptoms are aggravated by sitting and position. Associated symptoms include abdominal pain and pelvic pain. Pertinent negatives include no chest pain, dysuria, fever, headaches or weakness. (Currently on keflex to treat uti ) She has tried analgesics, ice, NSAIDs and muscle relaxant for the symptoms. The treatment provided moderate relief.      Medications: Outpatient Medications Prior to Visit  Medication Sig   baclofen (LIORESAL) 10 MG tablet TAKE 1 TABLET BY MOUTH THREE TIMES A DAY   budesonide-formoterol (SYMBICORT) 160-4.5 MCG/ACT inhaler Inhale 2 puffs into the lungs 2 (two) times daily.   cephALEXin (KEFLEX) 500 MG capsule Take 1 capsule (500 mg total) by mouth 2 (two) times daily.   cetirizine (ZYRTEC) 10 MG tablet Take 1 tablet (10 mg total) by mouth daily.   diclofenac (VOLTAREN) 75 MG EC tablet TAKE 1 TABLET BY MOUTH TWICE A DAY   famotidine (PEPCID) 20 MG tablet Take 1 tablet (20 mg total) by mouth 2 (two) times daily. To reduce itching and hives.   fluticasone (FLONASE) 50 MCG/ACT nasal spray SPRAY 2 SPRAYS INTO EACH NOSTRIL EVERY DAY   hydrOXYzine (ATARAX) 10 MG tablet Take 1 tablet (10 mg total) by mouth 3 (three) times daily as needed. For itching and rash.   ibuprofen (ADVIL) 600 MG tablet Take 1 tablet (600 mg total) by mouth every 8 (eight) hours as needed.   montelukast (SINGULAIR) 10 MG tablet Take 1  tablet (10 mg total) by mouth at bedtime.   phenazopyridine (PYRIDIUM) 200 MG tablet Take 1 tablet (200 mg total) by mouth 3 (three) times daily as needed for pain.   Sertraline HCl (ZOLOFT PO) Take 10 mg by mouth.   VENTOLIN HFA 108 (90 Base) MCG/ACT inhaler TAKE 2 PUFFS BY MOUTH EVERY 6 HOURS AS NEEDED FOR WHEEZE OR SHORTNESS OF BREATH   [DISCONTINUED] triamcinolone ointment (KENALOG) 0.5 % Apply small amount to effected areas bid for itching   dicyclomine (BENTYL) 20 MG tablet Take 1 tablet (20 mg total) by mouth 2 (two) times daily. (Patient not taking: Reported on 04/27/2022)   mupirocin ointment (BACTROBAN) 2 % Apply 1 Application topically daily. (Patient not taking: Reported on 05/21/2022)   polyethylene glycol (MIRALAX / GLYCOLAX) 17 g packet Take 17 g by mouth as needed. (Patient not taking: Reported on 04/27/2022)   No facility-administered medications prior to visit.    Review of Systems  Constitutional:  Positive for activity change. Negative for appetite change, chills, fatigue and fever.       Patient unable to sit or stand normally due to pain in tailbone area and low back pain which has resulted from recent fall at home.   HENT:  Negative for congestion, postnasal drip, rhinorrhea, sinus pressure, sinus pain, sneezing and sore throat.   Eyes: Negative.   Respiratory:  Negative for cough, chest tightness, shortness of breath and wheezing.  Cardiovascular:  Negative for chest pain and palpitations.  Gastrointestinal:  Positive for abdominal pain. Negative for constipation, diarrhea, nausea and vomiting.  Endocrine: Negative for cold intolerance, heat intolerance, polydipsia and polyuria.  Genitourinary:  Positive for pelvic pain. Negative for dyspareunia, dysuria, flank pain, frequency and urgency.  Musculoskeletal:  Positive for back pain. Negative for arthralgias and myalgias.  Skin:  Negative for rash.  Allergic/Immunologic: Negative for environmental allergies.   Neurological:  Negative for dizziness, weakness and headaches.  Hematological:  Negative for adenopathy.  Psychiatric/Behavioral:  The patient is nervous/anxious.      Objective     Today's Vitals   06/07/22 1007  BP: 131/76  Pulse: 80  SpO2: 98%  Weight: 235 lb 1.9 oz (106.6 kg)   Body mass index is 34.72 kg/m.   Physical Exam Vitals and nursing note reviewed.  Constitutional:      General: She is in acute distress.     Appearance: Normal appearance. She is well-developed.  HENT:     Head: Normocephalic and atraumatic.  Eyes:     Pupils: Pupils are equal, round, and reactive to light.  Cardiovascular:     Rate and Rhythm: Normal rate and regular rhythm.     Pulses: Normal pulses.     Heart sounds: Normal heart sounds.  Pulmonary:     Effort: Pulmonary effort is normal.     Breath sounds: Normal breath sounds.  Abdominal:     General: Bowel sounds are normal. There is no distension.     Palpations: Abdomen is soft. There is no mass.     Tenderness: There is abdominal tenderness. There is no right CVA tenderness, left CVA tenderness, guarding or rebound.     Hernia: No hernia is present.  Genitourinary:    Comments: U/a is negative for evidence of infection or other abnormalities today  Musculoskeletal:        General: Normal range of motion.     Cervical back: Normal range of motion and neck supple.       Back:  Lymphadenopathy:     Cervical: No cervical adenopathy.  Skin:    General: Skin is warm and dry.     Capillary Refill: Capillary refill takes less than 2 seconds.  Neurological:     General: No focal deficit present.     Mental Status: She is alert and oriented to person, place, and time.  Psychiatric:        Mood and Affect: Mood normal.        Behavior: Behavior normal.        Thought Content: Thought content normal.        Judgment: Judgment normal.       Results for orders placed or performed in visit on 06/07/22  POCT URINALYSIS DIP  (CLINITEK)  Result Value Ref Range   Color, UA yellow yellow   Clarity, UA clear clear   Glucose, UA negative negative mg/dL   Bilirubin, UA negative negative   Ketones, POC UA negative negative mg/dL   Spec Grav, UA 1.010 1.010 - 1.025   Blood, UA trace-intact (A) negative   pH, UA 5.5 5.0 - 8.0   POC PROTEIN,UA negative negative, trace   Urobilinogen, UA 0.2 0.2 or 1.0 E.U./dL   Nitrite, UA Negative Negative   Leukocytes, UA Negative Negative    Assessment & Plan    1. Fall in home, initial encounter The patient fell off of chair and onto hard wood floor injuring her tailbone  and low back.   2. Coccyx pain Continue to ice effected area. Use tylenol, NSAIDs as indicated to reduce pain. Will get x-ray of sacrum/coccyx for further evaluation.  Will extend needed time off work, enabling her to return to work on 06/28/2022.  - DG Sacrum/Coccyx; Future  3. Low back pain, unspecified back pain laterality, unspecified chronicity, unspecified whether sciatica present Likely worse today due to recent fall. Urine sample negative for infection or other abnormalities. Will x-ray coccyx/sacrum for further evaluation.  - POCT URINALYSIS DIP (CLINITEK) - DG Sacrum/Coccyx; Future  4. Urinary tract infection with hematuria, site unspecified Conitnue with antibiotic treatment as previously prescribed    Return for prn worsening or persistent symptoms.        Ronnell Freshwater, NP  [facility] [phone] (phone) [phone] (fax)  [place]

## 2022-06-09 ENCOUNTER — Encounter: Payer: Self-pay | Admitting: Nurse Practitioner

## 2022-06-10 ENCOUNTER — Other Ambulatory Visit: Payer: Self-pay | Admitting: Nurse Practitioner

## 2022-06-10 DIAGNOSIS — L209 Atopic dermatitis, unspecified: Secondary | ICD-10-CM

## 2022-06-11 ENCOUNTER — Ambulatory Visit: Payer: Managed Care, Other (non HMO) | Attending: Urology

## 2022-06-11 ENCOUNTER — Ambulatory Visit
Admission: RE | Admit: 2022-06-11 | Discharge: 2022-06-11 | Disposition: A | Discharge status: Home / Self Care | Payer: Managed Care, Other (non HMO) | Source: Ambulatory Visit | Attending: Nurse Practitioner

## 2022-06-11 DIAGNOSIS — M6281 Muscle weakness (generalized): Secondary | ICD-10-CM | POA: Diagnosis present

## 2022-06-11 DIAGNOSIS — M545 Low back pain, unspecified: Secondary | ICD-10-CM

## 2022-06-11 DIAGNOSIS — R102 Pelvic and perineal pain: Secondary | ICD-10-CM | POA: Diagnosis present

## 2022-06-11 DIAGNOSIS — R278 Other lack of coordination: Secondary | ICD-10-CM | POA: Insufficient documentation

## 2022-06-11 DIAGNOSIS — M6289 Other specified disorders of muscle: Secondary | ICD-10-CM | POA: Diagnosis not present

## 2022-06-11 DIAGNOSIS — M533 Sacrococcygeal disorders, not elsewhere classified: Secondary | ICD-10-CM

## 2022-06-11 NOTE — Therapy (Addendum)
OUTPATIENT PHYSICAL THERAPY FEMALE PELVIC TREATMENT   Patient Name: Kimberly Armstrong MRN: [id number] DOB:[birth year], 41 y.o., female Today's Date: 06/11/2022   PT End of Session - 06/11/22 0758     Visit Number 2    Number of Visits 10    Date for PT Re-Evaluation 07/30/22    Authorization Type IE: 05/21/22    PT Start Time 0800    PT Stop Time 0840    PT Time Calculation (min) 40 min    Activity Tolerance Patient tolerated treatment well             Past Medical History:  Diagnosis Date   Asthma    Past Surgical History:  Procedure Laterality Date   ABDOMINAL HYSTERECTOMY N/A    [employer] 11/30/2020   BACK SURGERY     carpel tunnel     CESAREAN SECTION     CESAREAN SECTION N/A    [employer] 11/30/2020   ENDOMETRIAL ABLATION     EYE SURGERY N/A    [employer] 11/30/2020   PARTIAL HYSTERECTOMY     SPINE SURGERY N/A    [employer] 11/30/2020   TUBAL LIGATION N/A    [employer] 11/30/2020   Patient Active Problem List   Diagnosis Date Noted   Pelvic pain in female 11/17/2021   Abdominal pain in female 11/17/2021   Renal mass, left 10/05/2021   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   Tobacco use disorder, continuous 10/05/2021   Raynaud's phenomenon without gangrene 10/05/2021   Generalized abdominal tenderness without rebound tenderness 09/07/2021   Seasonal allergies 09/07/2021   Acute left-sided low back pain without sciatica 09/07/2021   Cold sore 09/07/2021   Encounter for well woman exam with routine gynecological exam 03/15/2021   Neck muscle spasm 03/15/2021   Mild intermittent asthma without complication 42/70/6237   Body mass index (BMI) of 33.0-33.9 in adult 03/15/2021   Screening for STD (sexually transmitted disease) 03/15/2021   Need for Tdap vaccination 03/15/2021   Encounter to establish care 12/03/2020   Chronic midline low back pain with bilateral sciatica 12/03/2020   Moderate cigarette smoker 12/03/2020   Encounter for  screening mammogram for breast cancer 12/03/2020   Asthma 23-Aug-1981    PCP: Ronnell Freshwater, NP  REFERRING PROVIDER: Hollice Espy, MD  REFERRING DIAG:  R33.9 (ICD-10-CM) - Incomplete bladder emptying  R10.2 (ICD-10-CM) - Pelvic pain in female  M62.89 (ICD-10-CM) - Pelvic floor dysfunction in female   THERAPY DIAG:  Pelvic floor dysfunction  Other lack of coordination  Pelvic pain  Muscle weakness (generalized)  Rationale for Evaluation and Treatment: Rehabilitation  ONSET DATE: 2 years  PRECAUTIONS: None  WEIGHT BEARING RESTRICTIONS: No  FALLS:  Has patient fallen in last 6 months? 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 Pt went to the urgent care on 05/20/22 and dx with kidney infection. Pt is currently on antibiotics. This is the second time she has had a kidney infection. Pt notices when she initiates urination it creates a cramping and occasional sharp feeling in the lower abdomen. Pt also feels that BMs are at times difficult to release. This happens intermittently and Pt has tried to change her diet to help emptying bowels/bladder. These episodes are now happening about every week now which is different from every month.    Pain location: Deep  Pain type: dull and sharp Pain description: sharp, dull, and stabbing   Aggravating factors: Pt cannot think of anything Relieving factors: rest   PATIENT GOALS: will assess next visit due to time constraints    UROLOGICAL HISTORY Fluid intake: Yes   Pain with urination: Yes; 95% Fully empty bladder: No Stream: Strong Urgency: Yes Frequency: 6-8x Nocturia:  every hour  Leakage: Coughing, Sneezing, and Laughing Pads: No Bladder control (0-10): 5/10  GASTROINTESTINAL HISTORY Deferred 2/2 time constraints  Pain with bowel movement:  Type of bowel movement: Frequency:  Fully empty rectum:    SEXUAL HISTORY/FUNCTION Pain with intercourse: Initial Penetration and During Penetration Ability to have vaginal penetration:  Yes: occasional pain Able to achieve orgasm?: Yes, occasional pain too   OBSTETRICAL HISTORY Vaginal deliveries: G4P3 C-section deliveries: 3 c-sections Currently pregnant: No  GYNECOLOGICAL HISTORY Hysterectomy: yes, abdominal hysterectomy  Pelvic Organ Prolapse: None Pain with exam: yes no Heaviness/pressure: no  SUBJECTIVE:  Since IE, Pt has had a fall on 10/10 where a chair was pulled out of her and landed on her tailbone. Pt used ice and bath soaked. Pt was advised to get an x-ray done but will be going today. Also Pt had a bladder infection and received antibiotics. Pt has be re-checked and Dr said there was a little bit of bacteria but is clearing up. Pt denies pain with a BM or constipation.             PAIN:  Are you having pain? No NPRS scale: 0/10    TODAY'S TREATMENT  Neuromuscular Reeducation:   Pre-treatment assessment:   OBJECTIVE:    COGNITION: Overall cognitive status: Within functional limits for tasks assessed     POSTURE:   Iliac crest height: L iliac crest higher, L shoulder elevation Pelvic obliquity: WNL   RANGE OF MOTION:    (Norm range in degrees)  LEFT 06/11/22 RIGHT 06/11/22  Lumbar forward flexion (65):  WNL    Lumbar extension (30): WNL    Lumbar lateral flexion (25):  WNL WNL  Thoracic and Lumbar rotation (30 degrees):    WNL WNL  Hip Flexion (0-125):   WNL WNL  Hip IR (0-45):  Restricted compared to R WNL  Hip ER (0-45):  WNL WNL  Hip Adduction:      Hip Abduction (0-40):  WNL WNL  Hip extension (0-15):     (*= pain, Blank rows = not tested)   STRENGTH: MMT     RLE 06/11/22 LLE 06/11/22  Hip Flexion 5 5  Hip Extension    Hip Abduction     Hip Adduction  Hip ER  5 5  Hip IR  5 5  Knee Extension 5 5  Knee Flexion 5 5  Dorsiflexion     Plantarflexion (seated) 5 5  (*= pain, Blank rows = not tested)   SPECIAL TESTS:  FABER (SN 81): negative B FADIR (SN 94): negative B   PALPATION:  Assessment of lumbar spine  Hypomobile at L3-L5 causing pain down the L buttock, could not tolerate Grade I mobs  L hypomobile SIJ joint compared to R and some pain, 2/10  Coccyx Pain at tailbone, 2/10 and L ligamentous structures and muscular  When asked to "bear down" able to feel coccyx extension    Abdominal: Deferred 2/2 time constraints  Diastasis:  finger above umbilicus,  fingers at and below umbilicus  Scar mobility: present/mobile perpendicular, parallel Rib flare: present/absent  EXTERNAL PELVIC EXAM: Patient educated on the purpose of the pelvic exam and articulated understanding; patient consented to the exam verbally. Deferred 2/2 time constraints  Palpation: Breath coordination: present/absent/inconsistent Voluntary Contraction: present/absent Relaxation: full/delayed/non-relaxing Perineal movement with sustained IAP increase ("bear down"): descent/no change/elevation/excessive descent Perineal movement with rapid IAP increase ("cough"): elevation/no change/descent Pubic symphysis: (0= no contraction, 1= flicker, 2= weak squeeze, 3= fair squeeze with lift, 4= good squeeze and lift against resistance, 5= strong squeeze against strong resistance)   Manual Therapy: Prone STM at L surrounding ligamentous/muscular structures near coccyx with hip IR movement  Initially 2-3/10 discomfort but improved with continued hip IR movement    Neuromuscular Re-education: Discussion on urinary urgency control as Pt feels "trickling" of urine at times when she uses the restroom. Will continue providing education in future sessions. Also  discussion on continuing toileting posture.   Discussion on ice modality and posture in sitting (avoiding posterior pelvic tilt) to avoid increased pressure at tailbone and provide pain relief.   Pain modulation while improved coccyx mobility and PFM lengthening  Supine piriformis pose  Prone hip IR with coordinated exhale  Cat/cow with coordinate breath   Patient response to interventions: Pt felt more relaxed and "loosened" at the tailbone    Patient Education:  Patient provided with HEP including: above PFM lengthen/pain modulation techniques. Patient educated throughout session on appropriate technique and form using multi-modal cueing, HEP, and activity modification. Patient will benefit from further education in order to maximize compliance and understanding for long-term therapeutic gains.    ASSESSMENT:  Clinical Impression: Patient arrives to clinic with excellent motivation to participate in today's session. Pt had a recent fall on 06/06/22 where she landed on the buttock/tailbone. Pt evaluated by physician on 06/07/22 and referred to receive x-ray. Pt will be going today. Pt denies N/T at buttock or groin area, no pain with a BM, or change in bowels/bladder. Upon physical examination, Pt demonstrates deficits in pain, posture, hypomobility, and ROM as evidenced by increased L iliac crest height with L shoulder elevation, restricted L hip IR ROM compared to R, hypomobility of L3-L5 with radiating pain towards L buttocks (inability to tolerate Grade I mobs), hypomobility of the L SIJ causing 2/10 pain, and tenderness at the tailbone (2/10) and surrounding ligaments/musculature. Abdominal and PFM external assessment will be performed next session to identify suggested deficits of IAP management, PFM coordination, PFM strength, PFM extensibility. Pt able to tolerate manual techniques in prone as well a pain modulation techniques, ending session feeling better at the tailbone. Patient will  continue to benefit from skilled therapeutic intervention to address deficits in IAP management, PFM coordination, PFM strength, PFM  extensibility, pain, scar mobility, posture, and ROM in order to increase PLOF and improve overall QOL.    Objective Impairments: decreased coordination, decreased strength, increased fascial restrictions, increased muscle spasms, and pain.   Activity Limitations: sleeping, continence, toileting, and locomotion level  Personal Factors: Age, Behavior pattern, Past/current experiences, Time since onset of injury/illness/exacerbation, and 1-2 comorbidities: hx of endometriosis, asthma  are also affecting patient's functional outcome.   Rehab Potential: Good  Clinical Decision Making: Evolving/moderate complexity  Evaluation Complexity: Moderate   GOALS: Goals reviewed with patient? Yes  SHORT TERM GOALS: Target date: 06/25/2022  Patient will demonstrate independent and coordinated diaphragmatic breathing in supine with a 1:2 breathing pattern for improved down-regulation of the nervous system and improved management of intra-abdominal pressures in order to increase function at home and in the community. Baseline: will assess next visit  Goal status: INITIAL    LONG TERM GOALS: Target date: 07/30/2022   Patient will score  >/= 68 and 57 on FOTO Urinary Problem and Bowel Constipation  in order to demonstrate improved IAP management, decreased pain, improved PFM coordination, and overall improved QOL.  Baseline: 62 and 52 Goal status: INITIAL  2.  Patient will decrease worst pain as reported on NPRS by at least 2 points to demonstrate clinically significant reduction in pain in order to restore/improve function and overall QOL. Baseline: 9/10 pelvic pain  Goal status: INITIAL  3.  Patient will report a decrease in painful/uncomfortable bladder emptying to 50% or less over the course of a week in order to demonstrate improved PFM coordination, improved PFM  extensibility, and overall improved ability to eliminate urine/feces without limitation or pain.  Baseline: 95% or more in a week Goal status: INITIAL  4.  Patient will report less than 5 incidents of stress urinary incontinence over the course of 3 weeks while coughing/sneezing/laughing in order to demonstrate improved PFM coordination, strength, and function for improved overall QOL. Baseline: dribbling with all the above and has to change undergarments from time to time Goal status: INITIAL  5.  Patient will report a decrease in voiding intervals during the night in order to demonstrate improved control of the bladder, PFM coordination, sleep quality, and overall QOL.  Baseline: every hour during the night Goal status: INITIAL   PLAN: PT Frequency: 1x/week  PT Duration: 10 weeks  Planned Interventions: Therapeutic exercises, Therapeutic activity, Neuromuscular re-education, Balance training, Gait training, Patient/Family education, Self Care, Joint mobilization, Spinal mobilization, Cryotherapy, Moist heat, scar mobilization, Taping, and Manual therapy  Plan For Next Session: abdominal/PFM external (c-section scar), how is tailbone?, thoracic rotation    Jassmine Vandruff, PT, DPT  06/11/2022, 7:58 AM

## 2022-06-13 ENCOUNTER — Encounter: Payer: Self-pay | Admitting: Nurse Practitioner

## 2022-06-15 NOTE — Telephone Encounter (Signed)
Pt is calling in for an update on her paperwork.   Please advise.

## 2022-06-16 ENCOUNTER — Telehealth: Payer: Self-pay

## 2022-06-16 DIAGNOSIS — R5381 Other malaise: Secondary | ICD-10-CM | POA: Insufficient documentation

## 2022-06-16 DIAGNOSIS — N390 Urinary tract infection, site not specified: Secondary | ICD-10-CM | POA: Insufficient documentation

## 2022-06-16 NOTE — Telephone Encounter (Signed)
Pt is requesting for a release to go back to work to be included in the paperwork that she is picking up tomorrow.

## 2022-06-16 NOTE — Telephone Encounter (Signed)
Called pt she is advised that paperwork is ready for pick it's at the front

## 2022-06-18 ENCOUNTER — Telehealth: Payer: Self-pay

## 2022-06-18 ENCOUNTER — Ambulatory Visit: Payer: Managed Care, Other (non HMO)

## 2022-06-18 DIAGNOSIS — R102 Pelvic and perineal pain: Secondary | ICD-10-CM

## 2022-06-18 DIAGNOSIS — M6289 Other specified disorders of muscle: Secondary | ICD-10-CM

## 2022-06-18 DIAGNOSIS — R278 Other lack of coordination: Secondary | ICD-10-CM

## 2022-06-18 DIAGNOSIS — M6281 Muscle weakness (generalized): Secondary | ICD-10-CM

## 2022-06-18 NOTE — Therapy (Signed)
OUTPATIENT PHYSICAL THERAPY FEMALE PELVIC TREATMENT   Patient Name: Kimberly Armstrong MRN: [id number] DOB:[birth year], 41 y.o., female Today's Date: 06/18/2022   PT End of Session - 06/18/22 0753     Visit Number 3    Number of Visits 10    Date for PT Re-Evaluation 07/30/22    Authorization Type IE: 05/21/22    PT Start Time 0800    PT Stop Time 0840    PT Time Calculation (min) 40 min    Activity Tolerance Patient tolerated treatment well             Past Medical History:  Diagnosis Date   Asthma    Past Surgical History:  Procedure Laterality Date   ABDOMINAL HYSTERECTOMY N/A    [employer] 11/30/2020   BACK SURGERY     carpel tunnel     CESAREAN SECTION     CESAREAN SECTION N/A    [employer] 11/30/2020   ENDOMETRIAL ABLATION     EYE SURGERY N/A    [employer] 11/30/2020   PARTIAL HYSTERECTOMY     SPINE SURGERY N/A    [employer] 11/30/2020   TUBAL LIGATION N/A    [employer] 11/30/2020   Patient Active Problem List   Diagnosis Date Noted   Urinary tract infection with hematuria 06/16/2022   Malaise and fatigue 06/16/2022   Pelvic pain in female 11/17/2021   Abdominal pain in female 11/17/2021   Renal mass, left 10/05/2021   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   Tobacco use disorder, continuous 10/05/2021   Raynaud's phenomenon without gangrene 10/05/2021   Generalized abdominal tenderness without rebound tenderness 09/07/2021   Seasonal allergies 09/07/2021   Acute left-sided low back pain without sciatica 09/07/2021   Cold sore 09/07/2021   Encounter for well woman exam with routine gynecological exam 03/15/2021   Neck muscle spasm 03/15/2021   Mild intermittent asthma without complication 87/56/4332   Body mass index (BMI) of 33.0-33.9 in adult 03/15/2021   Screening for STD (sexually transmitted disease) 03/15/2021   Need for Tdap vaccination 03/15/2021   Encounter to establish care 12/03/2020   Chronic midline low back pain with  bilateral sciatica 12/03/2020   Moderate cigarette smoker 12/03/2020   Encounter for screening mammogram for breast cancer 12/03/2020   Asthma 1980/11/03    PCP: Ronnell Freshwater, NP  REFERRING PROVIDER: Hollice Espy, MD  REFERRING DIAG:  R33.9 (ICD-10-CM) - Incomplete bladder emptying  R10.2 (ICD-10-CM) - Pelvic pain in female  M62.89 (ICD-10-CM) - Pelvic floor dysfunction in female   THERAPY DIAG:  Pelvic floor dysfunction  Other lack of coordination  Pelvic pain  Muscle weakness (generalized)  Rationale for Evaluation and Treatment: Rehabilitation  ONSET DATE: 2 years  PRECAUTIONS: None  WEIGHT BEARING RESTRICTIONS: No  FALLS:  Has patient fallen in last 6 months? 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 Pt went to the urgent care on 05/20/22 and dx with kidney infection. Pt is currently on antibiotics. This is the second time she has had a kidney infection. Pt notices when she initiates urination it creates a cramping and occasional sharp feeling in the lower abdomen. Pt also feels that BMs are at times difficult to release. This happens intermittently and Pt has tried to change her diet to help emptying bowels/bladder. These episodes are now happening about every week now which is different from every month.    Pain location: Deep  Pain type: dull and sharp Pain description: sharp, dull, and stabbing   Aggravating factors: Pt cannot think of anything Relieving factors: rest   PATIENT GOALS: will assess next visit due to time constraints    UROLOGICAL HISTORY Fluid intake: Yes   Pain with urination: Yes;  95% Fully empty bladder: No Stream: Strong Urgency: Yes Frequency: 6-8x Nocturia: every hour  Leakage: Coughing, Sneezing, and Laughing Pads: No Bladder control (0-10): 5/10  GASTROINTESTINAL HISTORY Deferred 2/2 time constraints  Pain with bowel movement:  Type of bowel movement: Frequency:  Fully empty rectum:    SEXUAL HISTORY/FUNCTION Pain with intercourse: Initial Penetration and During Penetration Ability to have vaginal penetration:  Yes: occasional pain Able to achieve orgasm?: Yes, occasional pain too   OBSTETRICAL HISTORY Vaginal deliveries: G4P3 C-section deliveries: 3 c-sections Currently pregnant: No  GYNECOLOGICAL HISTORY Hysterectomy: yes, abdominal hysterectomy  Pelvic Organ Prolapse: None Pain with exam: yes no Heaviness/pressure: no  SUBJECTIVE:  Pt was a bit sore after last session but the exercises helped and heat modality. Pt also had an episode of lower abdominal pain but thinks it could be related to a food allergy.  Tailbone is a lot better.        PAIN:  Are you having pain? No NPRS scale: 0/10    OBJECTIVE:    COGNITION: Overall cognitive status: Within functional limits for tasks assessed     POSTURE:   Iliac crest height: L iliac crest higher, L shoulder elevation Pelvic obliquity: WNL   RANGE OF MOTION:    (Norm range in degrees)  LEFT 06/11/22 RIGHT 06/11/22  Lumbar forward flexion (65):  WNL    Lumbar extension (30): WNL    Lumbar lateral flexion (25):  WNL WNL  Thoracic and Lumbar rotation (30 degrees):    WNL WNL  Hip Flexion (0-125):   WNL WNL  Hip IR (0-45):  Restricted compared to R WNL  Hip ER (0-45):  WNL WNL  Hip Adduction:      Hip Abduction (0-40):  WNL WNL  Hip extension (0-15):     (*= pain, Blank rows = not tested)   STRENGTH: MMT    RLE 06/11/22 LLE 06/11/22  Hip Flexion 5 5  Hip Extension    Hip Abduction     Hip Adduction     Hip ER  5 5  Hip IR  5 5  Knee Extension 5 5  Knee Flexion 5 5   Dorsiflexion     Plantarflexion (seated) 5 5  (*= pain, Blank rows = not tested)   SPECIAL TESTS:  FABER (SN 81): negative B FADIR (  SN 94): negative B   PALPATION:  Assessment of lumbar spine  Hypomobile at L3-L5 causing pain down the L buttock, could not tolerate Grade I mobs  L hypomobile SIJ joint compared to R and some pain, 2/10  Coccyx Pain at tailbone, 2/10 and L ligamentous structures and muscular  When asked to "bear down" able to feel coccyx extension     TODAY'S TREATMENT:  Manual Therapy: Abdominal myofascial release for improved fascial sling mobility and pain modulation  Center of abdomen increased myofascial tension, no pain  Scar mobilization in circular/vertical/horizontal motions for improved tissue extensibility  Significant scar restriction above c-section scar "numb and tingling"  Improved with continued intervention    Neuromuscular Re-education: Pre-treatment assessment:  Abdominal:  Diastasis: none Scar mobility: c-section x3, significant scar restriction above the scar and at the scar. From R ASIS close to L ASIS   EXTERNAL PELVIC EXAM: Patient educated on the purpose of the pelvic exam and articulated understanding; patient consented to the exam verbally.  Breath coordination: present Voluntary Contraction: present, 4/5 MMT  Relaxation: full Perineal movement with sustained IAP increase ("bear down"): no change Perineal movement with rapid IAP increase ("cough"): no change (0= no contraction, 1= flicker, 2= weak squeeze, 3= fair squeeze with lift, 4= good squeeze and lift against resistance, 5= strong squeeze against strong resistance)   Supine hooklying diaphragmatic breathing with VCs and TCs for downregulation of the nervous system and improved management of IAP  Seated diaphragmatic breathing with VCs and TCs for downregulation of the nervous system and improved management of IAP Cueing to decrease activation of superficial abdominal  muscles during inhalation   Patient response to interventions: Pt felt relaxed at the lower abdominals after manual and felt underwear lining at lower abdominals   Patient Education:  Patient provided with HEP including: supine/seated diaphragmatic breathing and scar mobilization handout. Patient educated throughout session on appropriate technique and form using multi-modal cueing, HEP, and activity modification. Patient will benefit from further education in order to maximize compliance and understanding for long-term therapeutic gains.    ASSESSMENT:  Clinical Impression: Patient arrives to clinic with excellent motivation to participate in today's session. Pt continues to demonstrate deficits in pain, posture, hypomobility, and ROM. Upon PFM external assessment, Pt demonstrates deficits in PFM coordination and PFM extensibility as evidenced by no change in PFM with sustained IAP increase "bear down" and observed Valsalva. After manual palpation/assessment, Pt with myofascial tension in the center of the abdomen as well as having significant scar restriction at c-section scar (x3) and above. With continued manual intervention, Pt felt improved sensation at and above c-section scar. Pt guided on how to perform scar mobilization as part of HEP. Pt responded positively to manual, active, and educational interventions. Patient will continue to benefit from skilled therapeutic intervention to address deficits in IAP management, PFM coordination, PFM extensibility, pain, scar mobility, posture, and ROM in order to increase PLOF and improve overall QOL.    Objective Impairments: decreased coordination, decreased strength, increased fascial restrictions, increased muscle spasms, and pain.   Activity Limitations: sleeping, continence, toileting, and locomotion level  Personal Factors: Age, Behavior pattern, Past/current experiences, Time since onset of injury/illness/exacerbation, and 1-2  comorbidities: hx of endometriosis, asthma  are also affecting patient's functional outcome.   Rehab Potential: Good  Clinical Decision Making: Evolving/moderate complexity  Evaluation Complexity: Moderate   GOALS: Goals reviewed with patient? Yes  SHORT TERM GOALS: Target date: 06/25/2022  Patient will demonstrate independent and coordinated diaphragmatic breathing  in supine with a 1:2 breathing pattern for improved down-regulation of the nervous system and improved management of intra-abdominal pressures in order to increase function at home and in the community. Baseline: present and consistent in supine, required cueing in seated due to superficial abdominal activation Goal status: INITIAL    LONG TERM GOALS: Target date: 07/30/2022   Patient will score  >/= 68 and 57 on FOTO Urinary Problem and Bowel Constipation  in order to demonstrate improved IAP management, decreased pain, improved PFM coordination, and overall improved QOL.  Baseline: 62 and 52 Goal status: INITIAL  2.  Patient will decrease worst pain as reported on NPRS by at least 2 points to demonstrate clinically significant reduction in pain in order to restore/improve function and overall QOL. Baseline: 9/10 pelvic pain  Goal status: INITIAL  3.  Patient will report a decrease in painful/uncomfortable bladder emptying to 50% or less over the course of a week in order to demonstrate improved PFM coordination, improved PFM extensibility, and overall improved ability to eliminate urine/feces without limitation or pain.  Baseline: 95% or more in a week Goal status: INITIAL  4.  Patient will report less than 5 incidents of stress urinary incontinence over the course of 3 weeks while coughing/sneezing/laughing in order to demonstrate improved PFM coordination, strength, and function for improved overall QOL. Baseline: dribbling with all the above and has to change undergarments from time to time Goal status:  INITIAL  5.  Patient will report a decrease in voiding intervals during the night in order to demonstrate improved control of the bladder, PFM coordination, sleep quality, and overall QOL.  Baseline: every hour during the night Goal status: INITIAL   PLAN: PT Frequency: 1x/week  PT Duration: 10 weeks  Planned Interventions: Therapeutic exercises, Therapeutic activity, Neuromuscular re-education, Balance training, Gait training, Patient/Family education, Self Care, Joint mobilization, Spinal mobilization, Cryotherapy, Moist heat, scar mobilization, Taping, and Manual therapy  Plan For Next Session: thoracic rotation, PFM lengthening/relaxation, how did breathing and scar go?    Adrion Menz, PT, DPT  06/18/2022, 7:54 AM

## 2022-06-18 NOTE — Telephone Encounter (Signed)
I need to figure out how to make a note for the patient when they are not in the office or add an addendum to prior note. I just need a minute. The return to work date will be 10/30

## 2022-06-18 NOTE — Telephone Encounter (Signed)
Patient called office to see about her work note, please advise, thanks!

## 2022-06-21 NOTE — Telephone Encounter (Signed)
I think I did this, correct?

## 2022-06-21 NOTE — Telephone Encounter (Signed)
Pt is advised of paperwork for pick up

## 2022-06-24 DIAGNOSIS — W19XXXA Unspecified fall, initial encounter: Secondary | ICD-10-CM | POA: Insufficient documentation

## 2022-06-24 DIAGNOSIS — M533 Sacrococcygeal disorders, not elsewhere classified: Secondary | ICD-10-CM | POA: Insufficient documentation

## 2022-06-24 DIAGNOSIS — Y92009 Unspecified place in unspecified non-institutional (private) residence as the place of occurrence of the external cause: Secondary | ICD-10-CM | POA: Insufficient documentation

## 2022-06-25 ENCOUNTER — Other Ambulatory Visit: Payer: Self-pay | Admitting: Nurse Practitioner

## 2022-06-25 ENCOUNTER — Ambulatory Visit: Payer: Managed Care, Other (non HMO)

## 2022-06-25 DIAGNOSIS — R102 Pelvic and perineal pain unspecified side: Secondary | ICD-10-CM

## 2022-06-25 DIAGNOSIS — M6289 Other specified disorders of muscle: Secondary | ICD-10-CM | POA: Diagnosis not present

## 2022-06-25 DIAGNOSIS — M6281 Muscle weakness (generalized): Secondary | ICD-10-CM

## 2022-06-25 DIAGNOSIS — J4520 Mild intermittent asthma, uncomplicated: Secondary | ICD-10-CM

## 2022-06-25 DIAGNOSIS — R278 Other lack of coordination: Secondary | ICD-10-CM

## 2022-06-25 NOTE — Therapy (Signed)
OUTPATIENT PHYSICAL THERAPY FEMALE PELVIC TREATMENT   Patient Name: Kimberly Armstrong MRN: [id number] DOB:September 18, 1980, 41 y.o., female Today's Date: 06/25/2022   PT End of Session - 06/25/22 0751     Visit Number 4    Number of Visits 10    Date for PT Re-Evaluation 07/30/22    Authorization Type IE: 05/21/22    PT Start Time 0800    PT Stop Time 0840    PT Time Calculation (min) 40 min    Activity Tolerance Patient tolerated treatment well             Past Medical History:  Diagnosis Date   Asthma    Past Surgical History:  Procedure Laterality Date   ABDOMINAL HYSTERECTOMY N/A    [employer] 11/30/2020   BACK SURGERY     carpel tunnel     CESAREAN SECTION     CESAREAN SECTION N/A    [employer] 11/30/2020   ENDOMETRIAL ABLATION     EYE SURGERY N/A    [employer] 11/30/2020   PARTIAL HYSTERECTOMY     SPINE SURGERY N/A    [employer] 11/30/2020   TUBAL LIGATION N/A    [employer] 11/30/2020   Patient Active Problem List   Diagnosis Date Noted   Fall at home 06/24/2022   Coccyx pain 06/24/2022   Urinary tract infection with hematuria 06/16/2022   Malaise and fatigue 06/16/2022   Pelvic pain in female 11/17/2021   Abdominal pain in female 11/17/2021   Renal mass, left 10/05/2021   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   Tobacco use disorder, continuous 10/05/2021   Raynaud's phenomenon without gangrene 10/05/2021   Generalized abdominal tenderness without rebound tenderness 09/07/2021   Seasonal allergies 09/07/2021   Acute left-sided low back pain without sciatica 09/07/2021   Cold sore 09/07/2021   Encounter for well woman exam with routine gynecological exam 03/15/2021   Neck muscle spasm 03/15/2021   Mild intermittent asthma without complication 66/01/3015   Body mass index (BMI) of 33.0-33.9 in adult 03/15/2021   Screening for STD (sexually transmitted disease) 03/15/2021   Need for Tdap vaccination 03/15/2021   Encounter to establish care  12/03/2020   Low back pain 12/03/2020   Moderate cigarette smoker 12/03/2020   Encounter for screening mammogram for breast cancer 12/03/2020   Asthma Nov 25, 1980    PCP: Ronnell Freshwater, NP  REFERRING PROVIDER: Hollice Espy, MD  REFERRING DIAG:  R33.9 (ICD-10-CM) - Incomplete bladder emptying  R10.2 (ICD-10-CM) - Pelvic pain in female  M62.89 (ICD-10-CM) - Pelvic floor dysfunction in female   THERAPY DIAG:  Pelvic floor dysfunction  Other lack of coordination  Pelvic pain  Muscle weakness (generalized)  Rationale for Evaluation and Treatment: Rehabilitation  ONSET DATE: 2 years  PRECAUTIONS: None  WEIGHT BEARING RESTRICTIONS: No  FALLS:  Has patient fallen in last 6 months? 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 Pt went to the urgent care on 05/20/22 and dx with kidney infection. Pt is currently on antibiotics. This is the second time she has had a kidney infection. Pt notices when she initiates urination it creates a cramping and occasional sharp feeling in the lower abdomen. Pt also feels that BMs are at times difficult to release. This happens intermittently and Pt has tried to change her diet to help emptying bowels/bladder. These episodes are now happening about every week now which is different from every month.    Pain location: Deep  Pain type: dull and sharp Pain description: sharp, dull, and stabbing   Aggravating factors: Pt cannot think of anything Relieving factors: rest   PATIENT GOALS: will assess next visit due to time constraints    UROLOGICAL HISTORY Fluid intake: Yes   Pain with  urination: Yes; 95% Fully empty bladder: No Stream: Strong Urgency: Yes Frequency: 6-8x Nocturia: every hour  Leakage: Coughing, Sneezing, and Laughing Pads: No Bladder control (0-10): 5/10  GASTROINTESTINAL HISTORY Deferred 2/2 time constraints  Pain with bowel movement:  Type of bowel movement: Frequency:  Fully empty rectum:    SEXUAL HISTORY/FUNCTION Pain with intercourse: Initial Penetration and During Penetration Ability to have vaginal penetration:  Yes: occasional pain Able to achieve orgasm?: Yes, occasional pain too   OBSTETRICAL HISTORY Vaginal deliveries: G4P3 C-section deliveries: 3 c-sections Currently pregnant: No  GYNECOLOGICAL HISTORY Hysterectomy: yes, abdominal hysterectomy  Pelvic Organ Prolapse: None Pain with exam: yes no Heaviness/pressure: no  SUBJECTIVE:  Pt has been doing well and practicing HEP. Pt has performed scar mobs and with husband as well. Pt reports feeling less of the numbness as of last session.   PAIN:  Are you having pain? No NPRS scale: 0/10    OBJECTIVE:    COGNITION: Overall cognitive status: Within functional limits for tasks assessed     POSTURE:   Iliac crest height: L iliac crest higher, L shoulder elevation Pelvic obliquity: WNL   RANGE OF MOTION:    (Norm range in degrees)  LEFT 06/11/22 RIGHT 06/11/22  Lumbar forward flexion (65):  WNL    Lumbar extension (30): WNL    Lumbar lateral flexion (25):  WNL WNL  Thoracic and Lumbar rotation (30 degrees):    WNL WNL  Hip Flexion (0-125):   WNL WNL  Hip IR (0-45):  Restricted compared to R WNL  Hip ER (0-45):  WNL WNL  Hip Adduction:      Hip Abduction (0-40):  WNL WNL  Hip extension (0-15):     (*= pain, Blank rows = not tested)   STRENGTH: MMT    RLE 06/11/22 LLE 06/11/22  Hip Flexion 5 5  Hip Extension    Hip Abduction     Hip Adduction     Hip ER  5 5  Hip IR  5 5  Knee Extension 5 5  Knee Flexion 5 5  Dorsiflexion     Plantarflexion  (seated) 5 5  (*= pain, Blank rows = not tested)   SPECIAL TESTS:  FABER (SN 81): negative B FADIR (SN 94): negative B   PALPATION:  Assessment of lumbar spine  Hypomobile at L3-L5  causing pain down the L buttock, could not tolerate Grade I mobs  L hypomobile SIJ joint compared to R and some pain, 2/10  Coccyx Pain at tailbone, 2/10 and L ligamentous structures and muscular  When asked to "bear down" able to feel coccyx extension    Abdominal:  Diastasis: none Scar mobility: c-section x3, significant scar restriction above the scar and at the scar. From R ASIS close to L ASIS   EXTERNAL PELVIC EXAM: Patient educated on the purpose of the pelvic exam and articulated understanding; patient consented to the exam verbally.  Breath coordination: present Voluntary Contraction: present, 4/5 MMT  Relaxation: full Perineal movement with sustained IAP increase ("bear down"): no change Perineal movement with rapid IAP increase ("cough"): no change (0= no contraction, 1= flicker, 2= weak squeeze, 3= fair squeeze with lift, 4= good squeeze and lift against resistance, 5= strong squeeze against strong resistance)    TODAY'S TREATMENT:  Manual Therapy: Abdominal myofascial release for improved fascial sling mobility and pain modulation  Improvement in myofascial tension, no pain  Scar mobilization in circular/vertical/horizontal motions for improved tissue extensibility  Scar restriction above c-section scar  but no "numb and tingling" this session and improved restriction with continued intervention   Neuromuscular Re-education: Supine hooklying diaphragmatic breathing with VCs and TCs for downregulation of the nervous system and improved management of IAP   Supine hooklying PFM lengthening techniques with diaphragmatic breathing, VCs and TCs as needed              B Single knee to chest   Double knee to chest              "Happy baby" pose   Butterfly pose "adductor stretch"    Supine piriformis pose   Right sidelying thoracic otations, x10, for improved lengthening of the anterior fascial slings and pain modulation   Patient response to interventions: Pt felt lengthening of the spine with thoracic rotation   Patient Education:  Patient provided with HEP including: sidelying thoracic rotation, PFM lengthening techniques. Patient educated throughout session on appropriate technique and form using multi-modal cueing, HEP, and activity modification. Patient will benefit from further education in order to maximize compliance and understanding for long-term therapeutic gains.    ASSESSMENT:  Clinical Impression: Patient arrives to clinic with excellent motivation to participate in today's session. Pt continues to demonstrate deficits in IAP management, PFM coordination, PFM extensibility, pain, scar mobility, posture, and ROM. Pt reports doing well and having no pelvic pain. Pt required moderate VCs and TCs for PFM lengthening/pain modulation techniques for proper technique. Upon manual palpation of abdomen and c-section scar, Pt with improved scar restriction and no "numbness or tingling." Pt will continue scar mobs as part of HEP. Pt responded positively to manual, active, and educational interventions. Patient will continue to benefit from skilled therapeutic intervention to address deficits in IAP management, PFM coordination, PFM extensibility, pain, scar mobility, posture, and ROM in order to increase PLOF and improve overall QOL.    Objective Impairments: decreased coordination, decreased strength, increased fascial restrictions, increased muscle spasms, and pain.   Activity Limitations: sleeping, continence, toileting, and locomotion level  Personal Factors: Age, Behavior pattern, Past/current experiences, Time since onset of injury/illness/exacerbation, and 1-2 comorbidities: hx of endometriosis, asthma  are also affecting patient's functional outcome.    Rehab Potential: Good  Clinical Decision Making: Evolving/moderate complexity  Evaluation Complexity: Moderate   GOALS: Goals reviewed with patient? Yes  SHORT TERM GOALS: Target date: 06/25/2022  Patient  will demonstrate independent and coordinated diaphragmatic breathing in supine with a 1:2 breathing pattern for improved down-regulation of the nervous system and improved management of intra-abdominal pressures in order to increase function at home and in the community. Baseline: present and consistent in supine, required cueing in seated due to superficial abdominal activation Goal status: INITIAL    LONG TERM GOALS: Target date: 07/30/2022   Patient will score  >/= 68 and 57 on FOTO Urinary Problem and Bowel Constipation  in order to demonstrate improved IAP management, decreased pain, improved PFM coordination, and overall improved QOL.  Baseline: 62 and 52 Goal status: INITIAL  2.  Patient will decrease worst pain as reported on NPRS by at least 2 points to demonstrate clinically significant reduction in pain in order to restore/improve function and overall QOL. Baseline: 9/10 pelvic pain  Goal status: INITIAL  3.  Patient will report a decrease in painful/uncomfortable bladder emptying to 50% or less over the course of a week in order to demonstrate improved PFM coordination, improved PFM extensibility, and overall improved ability to eliminate urine/feces without limitation or pain.  Baseline: 95% or more in a week Goal status: INITIAL  4.  Patient will report less than 5 incidents of stress urinary incontinence over the course of 3 weeks while coughing/sneezing/laughing in order to demonstrate improved PFM coordination, strength, and function for improved overall QOL. Baseline: dribbling with all the above and has to change undergarments from time to time Goal status: INITIAL  5.  Patient will report a decrease in voiding intervals during the night in order to  demonstrate improved control of the bladder, PFM coordination, sleep quality, and overall QOL.  Baseline: every hour during the night Goal status: INITIAL   PLAN: PT Frequency: 1x/week  PT Duration: 10 weeks  Planned Interventions: Therapeutic exercises, Therapeutic activity, Neuromuscular re-education, Balance training, Gait training, Patient/Family education, Self Care, Joint mobilization, Spinal mobilization, Cryotherapy, Moist heat, scar mobilization, Taping, and Manual therapy  Plan For Next Session: how is scar/lengthen techniques going? Breathing diff positions, deep core start    [employer], PT, DPT  06/25/2022, 8:11 AM

## 2022-06-29 ENCOUNTER — Ambulatory Visit (INDEPENDENT_AMBULATORY_CARE_PROVIDER_SITE_OTHER): Payer: Managed Care, Other (non HMO) | Admitting: Nurse Practitioner

## 2022-06-29 ENCOUNTER — Encounter: Payer: Self-pay | Admitting: Nurse Practitioner

## 2022-06-29 VITALS — BP 130/81 | HR 69 | Ht 69.0 in | Wt 234.8 lb

## 2022-06-29 DIAGNOSIS — D487 Neoplasm of uncertain behavior of other specified sites: Secondary | ICD-10-CM

## 2022-06-29 DIAGNOSIS — N838 Other noninflammatory disorders of ovary, fallopian tube and broad ligament: Secondary | ICD-10-CM | POA: Diagnosis not present

## 2022-06-29 DIAGNOSIS — R102 Pelvic and perineal pain: Secondary | ICD-10-CM | POA: Diagnosis not present

## 2022-06-29 MED ORDER — CYCLOBENZAPRINE HCL 10 MG PO TABS: ORAL_TABLET | ORAL | 1 refills | Status: DC

## 2022-06-29 NOTE — Progress Notes (Signed)
Established patient visit   Patient: Kimberly Armstrong   DOB: 06-10-1981   41 y.o. Female  MRN: [id number] Visit Date: 06/29/2022   Chief Complaint  Patient presents with   Pelvic Pain   Subjective    The patient has been having this problem for over a year.  MRI of the pelvis was done 08/2021 showed -  1. Patient is presumed to be status post supracervical hysterectomy. Close correlation with surgical records recommended. 1.9 x 1.7 x 1.8 cm complex cystic mass is identified in the central cervical stroma. Cervical canal not well demonstrated in this lesion may be related to the canal or in the central stroma. The lesion may represent a tight cluster of multiple cysts or a single multicystic lesion. Large nabothian cyst would be a consideration. Cystic cervicitis would also be a consideration. Adenoma malignum/neoplasm cannot be excluded by imaging. 2. A second small nodular component along the anterior wall of the cervix is immediately anterior to the above described lesion and measures up to 1.3 cm in maximum dimension. This lesion does appear to generates some mass-effect on the posterior wall of the urinary bladder. While imaging features are nonspecific, this does not appear overtly aggressive and may simply represent scar.  She did see GYN who did not feel as though this was the cause of her pelvic pain. Referred her to urology and GI.  -she is currently taking physical therapy for the pelvic pain and urinary symptoms.   Patient was scheduled to go back to work on 10/302023 after being on FMLA since September with the onset of worsening pelvic pain. She is no yet able to go back to work. Will postpone her return to work until after her initial appointment with new GYN provider.   Pelvic Pain The patient's primary symptoms include pelvic pain. This is a chronic problem. The current episode started more than 1 year ago. The problem occurs constantly. The problem has  been waxing and waning. The pain is moderate. The problem affects both sides. Associated symptoms include abdominal pain and dysuria. The symptoms are aggravated by bowel movements, urinating and intercourse. Treatments tried: muscle relaxers make it better. The treatment provided mild relief. She is sexually active. No, her partner does not have an STD. Contraceptive use: partial hysterectomy. 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 Her past medical history is significant for an abdominal surgery and menorrhagia.     Medications: Outpatient Medications Prior to Visit  Medication Sig   albuterol (VENTOLIN HFA) 108 (90 Base) MCG/ACT inhaler TAKE 2 PUFFS BY MOUTH EVERY 6 HOURS AS NEEDED FOR WHEEZE OR SHORTNESS OF BREATH   budesonide-formoterol (SYMBICORT) 160-4.5 MCG/ACT inhaler Inhale 2 puffs into the lungs 2 (two) times daily.   cephALEXin (KEFLEX) 500 MG capsule Take 1 capsule (500 mg total) by mouth 2 (two) times daily.   cetirizine (ZYRTEC) 10 MG tablet Take 1 tablet (10 mg total) by mouth daily.   diclofenac (VOLTAREN) 75 MG EC tablet TAKE 1 TABLET BY MOUTH TWICE A DAY   famotidine (PEPCID) 20 MG tablet Take 1 tablet (20 mg total) by mouth 2 (two) times daily. To reduce itching and hives.   fluticasone (FLONASE) 50 MCG/ACT nasal spray SPRAY 2 SPRAYS INTO EACH NOSTRIL EVERY DAY   hydrOXYzine (ATARAX) 10 MG tablet Take 1 tablet (10 mg total) by mouth 3 (three) times daily as needed. For itching and rash.   ibuprofen (ADVIL) 600 MG tablet Take 1 tablet (600 mg total) by mouth every 8 (  eight) hours as needed.   montelukast (SINGULAIR) 10 MG tablet Take 1 tablet (10 mg total) by mouth at bedtime.   phenazopyridine (PYRIDIUM) 200 MG tablet Take 1 tablet (200 mg total) by mouth 3 (three) times daily as needed for pain.   Sertraline HCl (ZOLOFT PO) Take 10 mg by mouth.   triamcinolone ointment (KENALOG) 0.5 % APPLY SMALL AMOUNT TO AFFECTED AREAS TWICE DAILY FOR ITCHING    [DISCONTINUED] baclofen (LIORESAL) 10 MG tablet TAKE 1 TABLET BY MOUTH THREE TIMES A DAY   [DISCONTINUED] dicyclomine (BENTYL) 20 MG tablet Take 1 tablet (20 mg total) by mouth 2 (two) times daily. (Patient not taking: Reported on 04/27/2022)   [DISCONTINUED] mupirocin ointment (BACTROBAN) 2 % Apply 1 Application topically daily. (Patient not taking: Reported on 05/21/2022)   [DISCONTINUED] polyethylene glycol (MIRALAX / GLYCOLAX) 17 g packet Take 17 g by mouth as needed. (Patient not taking: Reported on 04/27/2022)   No facility-administered medications prior to visit.    Review of Systems  Constitutional:  Positive for activity change and fatigue.  Gastrointestinal:  Positive for abdominal pain.  Genitourinary:  Positive for dyspareunia, dysuria, menorrhagia and pelvic pain.       Objective     Today's Vitals   06/29/22 1431  BP: 130/81  Pulse: 69  SpO2: 100%  Weight: 234 lb 12.8 oz (106.5 kg)  Height: '5\' 9"'$  (1.753 m)   Body mass index is 34.67 kg/m.  BP Readings from Last 3 Encounters:  06/29/22 130/81  06/07/22 131/76  06/01/22 (Abnormal) 152/98    Wt Readings from Last 3 Encounters:  06/29/22 234 lb 12.8 oz (106.5 kg)  06/07/22 235 lb 1.9 oz (106.6 kg)  06/01/22 232 lb 1.9 oz (105.3 kg)    Physical Exam Vitals and nursing note reviewed.  Constitutional:      Appearance: Normal appearance. She is well-developed. She is ill-appearing.  HENT:     Head: Normocephalic.     Mouth/Throat:     Mouth: Mucous membranes are moist.     Pharynx: Oropharynx is clear.  Eyes:     Pupils: Pupils are equal, round, and reactive to light.  Cardiovascular:     Rate and Rhythm: Normal rate and regular rhythm.     Pulses: Normal pulses.     Heart sounds: Normal heart sounds.  Pulmonary:     Effort: Pulmonary effort is normal.     Breath sounds: Normal breath sounds.  Abdominal:     General: Bowel sounds are normal. There is no distension.     Palpations: Abdomen is soft. There  is no mass.     Tenderness: There is abdominal tenderness. There is no right CVA tenderness, left CVA tenderness, guarding or rebound.     Hernia: No hernia is present.     Comments: : Patinet having suprapubic tenderness with palpation.    Musculoskeletal:        General: Normal range of motion.     Cervical back: Normal range of motion and neck supple.  Lymphadenopathy:     Cervical: No cervical adenopathy.  Skin:    General: Skin is warm and dry.     Capillary Refill: Capillary refill takes less than 2 seconds.  Neurological:     General: No focal deficit present.     Mental Status: She is alert and oriented to person, place, and time.  Psychiatric:        Mood and Affect: Mood normal.  Behavior: Behavior normal.        Thought Content: Thought content normal.        Judgment: Judgment normal.       Assessment & Plan    1. Pelvic pain in female Reviewed results of pelvic MRI showing adnexa;/cervical masses. Refer to new GYN for second opinion and further evaluation. She has had some relief of pain with flexeril. May take 5 to 10 mg at bedtime as needed for pain. Will extend patient's FMLA leave until after her initial visit with new GYN provider. Will complete and return to the patient and her employer as soon as possible.  - Ambulatory referral to Gynecology - cyclobenzaprine (FLEXERIL) 10 MG tablet; Take 1/2 to 1 tablet po QHS prn muscle pain  Dispense: 30 tablet; Refill: 1  2. Neoplasm of uncertain behavior of pelvis Refer to new GYN provider for second opinion and further evaluation and treatment.  - Ambulatory referral to Gynecology  3. Ovarian hypertrophy Refer to new GYN provider for second opinion and further evaluation and treatment.  - Ambulatory referral to Gynecology   Problem List Items Addressed This Visit       Other   Neoplasm of uncertain behavior of pelvis   Relevant Orders   Ambulatory referral to Gynecology   Pelvic pain in female - Primary    Relevant Medications   cyclobenzaprine (FLEXERIL) 10 MG tablet   Other Relevant Orders   Ambulatory referral to Gynecology   Other Visit Diagnoses     Ovarian hypertrophy       Relevant Orders   Ambulatory referral to Gynecology        Return in about 5 weeks (around 08/03/2022) for pelvic pain .         Ronnell Freshwater, NP  [facility] [phone] (phone) [phone] (fax)  [place]

## 2022-07-08 ENCOUNTER — Ambulatory Visit: Payer: Managed Care, Other (non HMO) | Admitting: Radiology

## 2022-07-09 ENCOUNTER — Ambulatory Visit: Payer: Managed Care, Other (non HMO)

## 2022-07-12 ENCOUNTER — Telehealth: Payer: Self-pay

## 2022-07-12 NOTE — Telephone Encounter (Signed)
Pt was calling to check the status of the paperwork for work.   Please advise

## 2022-07-14 ENCOUNTER — Telehealth: Payer: Self-pay

## 2022-07-14 NOTE — Telephone Encounter (Signed)
Will you scan a copy of that paperwork into her chart in case they require more. Thanks  -HB

## 2022-07-14 NOTE — Telephone Encounter (Signed)
ok 

## 2022-07-14 NOTE — Telephone Encounter (Signed)
Called pt she is advised of her paper work that is completed and ready to be faxed copies are up front for pt pickup

## 2022-07-16 ENCOUNTER — Ambulatory Visit: Payer: Managed Care, Other (non HMO) | Attending: Urology

## 2022-07-16 DIAGNOSIS — M6281 Muscle weakness (generalized): Secondary | ICD-10-CM | POA: Diagnosis present

## 2022-07-16 DIAGNOSIS — R102 Pelvic and perineal pain: Secondary | ICD-10-CM | POA: Insufficient documentation

## 2022-07-16 DIAGNOSIS — R278 Other lack of coordination: Secondary | ICD-10-CM | POA: Diagnosis present

## 2022-07-16 DIAGNOSIS — M6289 Other specified disorders of muscle: Secondary | ICD-10-CM | POA: Insufficient documentation

## 2022-07-16 NOTE — Therapy (Signed)
OUTPATIENT PHYSICAL THERAPY FEMALE PELVIC TREATMENT   Patient Name: Kimberly Armstrong MRN: [id number] DOB:[birth year], 41 y.o., female Today's Date: 07/16/2022   PT End of Session - 07/16/22 0756     Visit Number 5    Number of Visits 10    Date for PT Re-Evaluation 07/30/22    Authorization Type IE: 05/21/22    PT Start Time 0800    PT Stop Time 0840    PT Time Calculation (min) 40 min    Activity Tolerance Patient tolerated treatment well             Past Medical History:  Diagnosis Date   Asthma    Past Surgical History:  Procedure Laterality Date   ABDOMINAL HYSTERECTOMY N/A    [employer] 11/30/2020   BACK SURGERY     carpel tunnel     CESAREAN SECTION     CESAREAN SECTION N/A    [employer] 11/30/2020   ENDOMETRIAL ABLATION     EYE SURGERY N/A    [employer] 11/30/2020   PARTIAL HYSTERECTOMY     SPINE SURGERY N/A    [employer] 11/30/2020   TUBAL LIGATION N/A    [facility] 11/30/2020   Patient Active Problem List   Diagnosis Date Noted   Fall at home 06/24/2022   Coccyx pain 06/24/2022   Urinary tract infection with hematuria 06/16/2022   Malaise and fatigue 06/16/2022   Pelvic pain in female 11/17/2021   Abdominal pain in female 11/17/2021   Renal mass, left 10/05/2021   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   Tobacco use disorder, continuous 10/05/2021   Raynaud's phenomenon without gangrene 10/05/2021   Generalized abdominal tenderness without rebound tenderness 09/07/2021   Seasonal allergies 09/07/2021   Acute left-sided low back pain without sciatica 09/07/2021   Cold sore 09/07/2021   Encounter for well woman exam with routine gynecological exam 03/15/2021   Neck muscle spasm 03/15/2021   Mild intermittent asthma without complication 96/78/9381   Body mass index (BMI) of 33.0-33.9 in adult 03/15/2021   Screening for STD (sexually transmitted disease) 03/15/2021   Need for Tdap vaccination 03/15/2021   Encounter to establish care  12/03/2020   Low back pain 12/03/2020   Moderate cigarette smoker 12/03/2020   Encounter for screening mammogram for breast cancer 12/03/2020   Asthma 1981-07-21    PCP: Ronnell Freshwater, NP  REFERRING PROVIDER: Hollice Espy, MD  REFERRING DIAG:  R33.9 (ICD-10-CM) - Incomplete bladder emptying  R10.2 (ICD-10-CM) - Pelvic pain in female  M62.89 (ICD-10-CM) - Pelvic floor dysfunction in female   THERAPY DIAG:  Pelvic floor dysfunction  Other lack of coordination  Pelvic pain  Muscle weakness (generalized)  Rationale for Evaluation and Treatment: Rehabilitation  ONSET DATE: 2 years  PRECAUTIONS: None  WEIGHT BEARING RESTRICTIONS: No  FALLS:  Has patient fallen in last 6 months? 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 Pt went to the urgent care on 05/20/22 and dx with kidney infection. Pt is currently on antibiotics. This is the second time she has had a kidney infection. Pt notices when she initiates urination it creates a cramping and occasional sharp feeling in the lower abdomen. Pt also feels that BMs are at times difficult to release. This happens intermittently and Pt has tried to change her diet to help emptying bowels/bladder. These episodes are now happening about every week now which is different from every month.    Pain location: Deep  Pain type: dull and sharp Pain description: sharp, dull, and stabbing   Aggravating factors: Pt cannot think of anything Relieving factors: rest   PATIENT GOALS: will assess next visit due to time constraints    UROLOGICAL HISTORY Fluid intake: Yes   Pain with  urination: Yes; 95% Fully empty bladder: No Stream: Strong Urgency: Yes Frequency: 6-8x Nocturia: every hour  Leakage: Coughing, Sneezing, and Laughing Pads: No Bladder control (0-10): 5/10  GASTROINTESTINAL HISTORY Deferred 2/2 time constraints  Pain with bowel movement:  Type of bowel movement: Frequency:  Fully empty rectum:    SEXUAL HISTORY/FUNCTION Pain with intercourse: Initial Penetration and During Penetration Ability to have vaginal penetration:  Yes: occasional pain Able to achieve orgasm?: Yes, occasional pain too   OBSTETRICAL HISTORY Vaginal deliveries: G4P3 C-section deliveries: 3 c-sections Currently pregnant: No  GYNECOLOGICAL HISTORY Hysterectomy: yes, abdominal hysterectomy  Pelvic Organ Prolapse: None Pain with exam: yes no Heaviness/pressure: no  SUBJECTIVE:  Pt has been having a lot of external stressors and kids being sick. Pt has been having increased pelvic pain intermittently. Pt has continued to try scar mobs and PFM lengthening/relaxation and at times helps but other times no.    PAIN:  Are you having pain? No NPRS scale: 0/10    OBJECTIVE:    COGNITION: Overall cognitive status: Within functional limits for tasks assessed     POSTURE:   Iliac crest height: L iliac crest higher, L shoulder elevation Pelvic obliquity: WNL   RANGE OF MOTION:    (Norm range in degrees)  LEFT 06/11/22 RIGHT 06/11/22  Lumbar forward flexion (65):  WNL    Lumbar extension (30): WNL    Lumbar lateral flexion (25):  WNL WNL  Thoracic and Lumbar rotation (30 degrees):    WNL WNL  Hip Flexion (0-125):   WNL WNL  Hip IR (0-45):  Restricted compared to R WNL  Hip ER (0-45):  WNL WNL  Hip Adduction:      Hip Abduction (0-40):  WNL WNL  Hip extension (0-15):     (*= pain, Blank rows = not tested)   STRENGTH: MMT    RLE 06/11/22 LLE 06/11/22  Hip Flexion 5 5  Hip Extension    Hip Abduction     Hip Adduction     Hip ER  5 5  Hip IR  5 5   Knee Extension 5 5  Knee Flexion 5 5  Dorsiflexion     Plantarflexion (seated) 5 5  (*= pain, Blank rows = not tested)   SPECIAL TESTS:  FABER (SN 81): negative B FADIR (SN 94): negative B  PALPATION:  Assessment of lumbar spine  Hypomobile at L3-L5 causing pain down the L buttock, could not tolerate Grade I mobs  L hypomobile SIJ joint compared to R and some pain, 2/10  Coccyx Pain at tailbone, 2/10 and L ligamentous structures and muscular  When asked to "bear down" able to feel coccyx extension    Abdominal:  Diastasis: none Scar mobility: c-section x3, significant scar restriction above the scar and at the scar. From R ASIS close to L ASIS   EXTERNAL PELVIC EXAM: Patient educated on the purpose of the pelvic exam and articulated understanding; patient consented to the exam verbally.  Breath coordination: present Voluntary Contraction: present, 4/5 MMT  Relaxation: full Perineal movement with sustained IAP increase ("bear down"): no change Perineal movement with rapid IAP increase ("cough"): no change (0= no contraction, 1= flicker, 2= weak squeeze, 3= fair squeeze with lift, 4= good squeeze and lift against resistance, 5= strong squeeze against strong resistance)    TODAY'S TREATMENT:  Manual Therapy: Abdominal myofascial release for improved fascial sling mobility and pain modulation  Improvement in myofascial tension, no pain  Scar mobilization above the scar in circular/vertical/horizontal motions for improved tissue extensibility  Significant scar restriction above c-section scar  with a dull ache this session    Neuromuscular Re-education: Supine hooklying diaphragmatic breathing with VCs and TCs for downregulation of the nervous system and improved management of IAP  Supine hooklying PFM lengthening techniques with diaphragmatic breathing, VCs and TCs as needed         Cat/cow         Prone cobra/baby cobra   Discussion on the pain cycle and pain  neuroscience. The idea of not "pushing thru" the pain. Pt verbalized understanding.   Discussion on continuing scar mobilization techniques at home and listening to body's reaction (upregulation of nervous system).    Patient response to interventions: Pt felt less tension in the LLQ and above pubic bone at end of session   Patient Education:  Patient provided with HEP including: prone cobra. Patient educated throughout session on appropriate technique and form using multi-modal cueing, HEP, and activity modification. Patient will benefit from further education in order to maximize compliance and understanding for long-term therapeutic gains.    ASSESSMENT:  Clinical Impression: Patient arrives to clinic with excellent motivation to participate in today's session. Pt continues to demonstrate deficits in IAP management, PFM coordination, PFM extensibility, pain, scar mobility, posture, and ROM. Pt reports having a few more instances of pelvic pain that have been intense. Pt follows up with GYN soon. After discussion, Pt verbalized understanding on pain neuroscience. Upon palpation, Pt with significant scar restriction at center of lower abdomen (above c-section scar) causing a "dull" ache but with continued manual technique, scar restriction decreased and skin palpated to be more pliable. Pt with decreased tension felt above the pubic bone at end of session. Pt required moderate VCs and TCs for a couple more PFM lengthening/pain modulation techniques for modifications. Pt responded positively to manual, active, and educational interventions. Patient will continue to benefit from skilled therapeutic intervention to address deficits in IAP management, PFM coordination, PFM extensibility, pain, scar mobility, posture, and ROM in order to increase PLOF and improve overall QOL.    Objective Impairments: decreased coordination, decreased strength, increased fascial restrictions, increased muscle spasms,  and pain.   Activity Limitations: sleeping, continence, toileting, and locomotion level  Personal Factors: Age, Behavior pattern, Past/current experiences, Time since onset of injury/illness/exacerbation, and 1-2 comorbidities: hx  of endometriosis, asthma  are also affecting patient's functional outcome.   Rehab Potential: Good  Clinical Decision Making: Evolving/moderate complexity  Evaluation Complexity: Moderate   GOALS: Goals reviewed with patient? Yes  SHORT TERM GOALS: Target date: 06/25/2022  Patient will demonstrate independent and coordinated diaphragmatic breathing in supine with a 1:2 breathing pattern for improved down-regulation of the nervous system and improved management of intra-abdominal pressures in order to increase function at home and in the community. Baseline: present and consistent in supine, required cueing in seated due to superficial abdominal activation Goal status: INITIAL    LONG TERM GOALS: Target date: 07/30/2022   Patient will score  >/= 68 and 57 on FOTO Urinary Problem and Bowel Constipation  in order to demonstrate improved IAP management, decreased pain, improved PFM coordination, and overall improved QOL.  Baseline: 62 and 52 Goal status: INITIAL  2.  Patient will decrease worst pain as reported on NPRS by at least 2 points to demonstrate clinically significant reduction in pain in order to restore/improve function and overall QOL. Baseline: 9/10 pelvic pain  Goal status: INITIAL  3.  Patient will report a decrease in painful/uncomfortable bladder emptying to 50% or less over the course of a week in order to demonstrate improved PFM coordination, improved PFM extensibility, and overall improved ability to eliminate urine/feces without limitation or pain.  Baseline: 95% or more in a week Goal status: INITIAL  4.  Patient will report less than 5 incidents of stress urinary incontinence over the course of 3 weeks while  coughing/sneezing/laughing in order to demonstrate improved PFM coordination, strength, and function for improved overall QOL. Baseline: dribbling with all the above and has to change undergarments from time to time Goal status: INITIAL  5.  Patient will report a decrease in voiding intervals during the night in order to demonstrate improved control of the bladder, PFM coordination, sleep quality, and overall QOL.  Baseline: every hour during the night Goal status: INITIAL   PLAN: PT Frequency: 1x/week  PT Duration: 10 weeks  Planned Interventions: Therapeutic exercises, Therapeutic activity, Neuromuscular re-education, Balance training, Gait training, Patient/Family education, Self Care, Joint mobilization, Spinal mobilization, Cryotherapy, Moist heat, scar mobilization, Taping, and Manual therapy  Plan For Next Session: how did scar mobs go/anymore intense pain?, manual, Breathing diff positions, deep core start    [employer], PT, DPT  07/16/2022, 7:56 AM

## 2022-07-19 ENCOUNTER — Telehealth: Payer: Self-pay | Admitting: *Deleted

## 2022-07-19 ENCOUNTER — Ambulatory Visit (INDEPENDENT_AMBULATORY_CARE_PROVIDER_SITE_OTHER): Payer: Managed Care, Other (non HMO) | Admitting: Radiology

## 2022-07-19 ENCOUNTER — Encounter: Payer: Self-pay | Admitting: Radiology

## 2022-07-19 VITALS — BP 128/86 | Ht 69.0 in | Wt 236.0 lb

## 2022-07-19 DIAGNOSIS — R102 Pelvic and perineal pain: Secondary | ICD-10-CM

## 2022-07-19 DIAGNOSIS — N809 Endometriosis, unspecified: Secondary | ICD-10-CM | POA: Diagnosis not present

## 2022-07-19 MED ORDER — ORILISSA 200 MG PO TABS: 1.0000 | ORAL_TABLET | Freq: Two times a day (BID) | ORAL | 2 refills | Status: DC

## 2022-07-19 NOTE — Progress Notes (Signed)
Kimberly Armstrong [birth year] [id number]   History:  41 y.o. G4P3 here with c/o pelvic pain x 5 years. S/p supracervical hyst for failed ablation and menorrhagia. Hx of endometriosis with endometrioma. Pain was cyclic and has become more constant over the past 37month. Often has pain when emptying her bladder and after having a BM. Negative GI workup. Pain with intercourse. Typically the pain is centrally located in the pelvis. NSAIDs no longer help. U/s and Pelvic MRI 1/23  Narrative & Impression  CLINICAL DATA:  Nodules identified in the vaginal cuff on recent ultrasound.   EXAM: MRI PELVIS WITHOUT AND WITH CONTRAST   TECHNIQUE: Multiplanar multisequence MR imaging of the pelvis was performed both before and after administration of intravenous contrast.   CONTRAST:  262mMULTIHANCE GADOBENATE DIMEGLUMINE 529 MG/ML IV SOLN   COMPARISON:  Ultrasound exam 09/10/2021   FINDINGS: Urinary Tract: The urinary bladder appears normal for the degree of distention. No evidence for urethral diverticulum.   Bowel:  Unremarkable visualized pelvic bowel loops.   Vascular/Lymphatic: No pathologically enlarged lymph nodes. No significant vascular abnormality seen.   Reproductive: Uterus is surgically absent in what appears to be a supracervical hysterectomy. There is a 1.9 x 1.7 x 1.8 cm complex cystic mass in the cervix. Cervical canal is not clearly delineated by MR imaging although this lesion does have a central location within the apparent cervical stroma. After IV contrast administration enhancing internal septa or thin mural components from adjacent cystic lesions are evident.   A second small nodular midline component is seen along the anterior cervix with some indentation of the posterior bladder wall this is immediately anterior to the above described multicystic lesion and measures approximately 1.3 x 1.1 x 0.7 cm (see axial T2 image 20 of series 8).   Right ovary  measures 2.0 x 1.2 x 3.3 cm, unremarkable.   Left ovary measures 2.8 x 1.3 x 2.3 cm, unremarkable.   Other:  No intraperitoneal free fluid.   Musculoskeletal: No focal suspicious marrow enhancement within the visualized bony anatomy.   IMPRESSION: 1. Patient is presumed to be status post supracervical hysterectomy. Close correlation with surgical records recommended. 1.9 x 1.7 x 1.8 cm complex cystic mass is identified in the central cervical stroma. Cervical canal not well demonstrated in this lesion may be related to the canal or in the central stroma. The lesion may represent a tight cluster of multiple cysts or a single multicystic lesion. Large nabothian cyst would be a consideration. Cystic cervicitis would also be a consideration. Adenoma malignum/neoplasm cannot be excluded by imaging. 2. A second small nodular component along the anterior wall of the cervix is immediately anterior to the above described lesion and measures up to 1.3 cm in maximum dimension. This lesion does appear to generates some mass-effect on the posterior wall of the urinary bladder. While imaging features are nonspecific, this does not appear overtly aggressive and may simply represent scar. 3. Unremarkable MR appearance of the ovaries.     Electronically Signed   By: ErMisty Stanley.D.   On: 09/17/2021 15:21    Gynecologic History Hysterectomy: menorrhagia  Sexually active: yes  Health Maintenance Last Pap: 2021.  Last mammogram: 2022.    Past medical history, past surgical history, family history and social history were all reviewed and documented in the EPIC chart.  ROS:  A ROS was performed and pertinent positives and negatives are included.  Exam:  Vitals:   07/19/22 0750  BP: 128/86  Weight: 236 lb (107 kg)  Height: '5\' 9"'$  (1.753 m)   Body mass index is 34.85 kg/m.  General appearance:  Normal Thyroid:  Symmetrical, normal in size, without palpable masses or  nodularity. Respiratory  Auscultation:  Clear without wheezing or rhonchi Cardiovascular  Auscultation:  Regular rate, without rubs, murmurs or gallops  Edema/varicosities:  Not grossly evident Abdominal  Soft,nontender, without masses, guarding or rebound.  Liver/spleen:  No organomegaly noted  Hernia:  None appreciated  Skin  Inspection:  Grossly normal Breasts: Examined lying and sitting.   Right: Without masses, retractions, nipple discharge or axillary adenopathy.   Left: Without masses, retractions, nipple discharge or axillary adenopathy. Genitourinary   Inguinal/mons:  Normal without inguinal adenopathy  External genitalia:  Normal appearing vulva with no masses, tenderness, or lesions  BUS/Urethra/Skene's glands:  Normal  Vagina:  Normal appearing with normal color and discharge, no lesions.   Cervix:  no lesions or discharge, no CMT  Uterus:  absent  Adnexa/parametria:     Rt/Lt: Tenderness of parametria bilaterally without masses palpated  Anus and perineum: Normal    Patient informed chaperone available to be present for pelvic exam. Patient has requested no chaperone to be present. Patient has been advised what will be completed during pelvic exam.   Assessment/Plan:   1. Pelvic pain - Urinalysis,Complete w/RFL Culture  2. Endometriosis determined by laparoscopy  Discussed surgical vs medical options. Since patient is a smoker will try Freida Busman (without estrogen add back) for now. She will work to stop smoking Continue NSAIDs prn - Elagolix Sodium (ORILISSA) 200 MG TABS; Take 1 tablet by mouth 2 (two) times daily.  Dispense: 60 tablet; Refill: 2   Follow up 3 months, if no improvement will consider surgical management   Kerry Dory WHNP-BC 8:24 AM 07/19/2022

## 2022-07-19 NOTE — Telephone Encounter (Signed)
Cannot use an IUD- has had a hyst. Not a candidate for estrogen- smoker. Has tried progestin only with no improvement.

## 2022-07-19 NOTE — Telephone Encounter (Signed)
Question answered on PA   CaseId:[id number];Status:Approved;Review Type:Prior Auth;Coverage Start Date:06/19/2022;Coverage End Date:01/15/2023;

## 2022-07-19 NOTE — Telephone Encounter (Signed)
[facility] patient Kimberly Armstrong needs PA. Please see the below:  Has the patient tried one of the following: a contraceptive (for example, combination oral contraceptives, levonorgestrel-releasing intrauterine systems [for example, Mirena , Liletta depo-medroxyprogesterone injection), an oral progesterone (for example, norethindrone tablets) or has a contraindication to a contraceptive, an oral progesterone, or a depo-medroxyprogesterone injection?  Please advise

## 2022-07-21 ENCOUNTER — Ambulatory Visit: Payer: Managed Care, Other (non HMO)

## 2022-07-21 LAB — URINALYSIS, COMPLETE W/RFL CULTURE
Bacteria, UA: NONE SEEN /HPF
Bilirubin Urine: NEGATIVE
Glucose, UA: NEGATIVE
Hyaline Cast: NONE SEEN /LPF
Ketones, ur: NEGATIVE
Leukocyte Esterase: NEGATIVE
Nitrites, Initial: NEGATIVE
Protein, ur: NEGATIVE
Specific Gravity, Urine: 1.004 (ref 1.001–1.035)
pH: 6 (ref 5.0–8.0)

## 2022-07-21 LAB — URINE CULTURE
MICRO NUMBER:: 14212034
Result:: NO GROWTH
SPECIMEN QUALITY:: ADEQUATE

## 2022-07-21 LAB — CULTURE INDICATED

## 2022-07-30 ENCOUNTER — Ambulatory Visit: Payer: Managed Care, Other (non HMO)

## 2022-08-03 ENCOUNTER — Ambulatory Visit (INDEPENDENT_AMBULATORY_CARE_PROVIDER_SITE_OTHER): Payer: Managed Care, Other (non HMO) | Admitting: Nurse Practitioner

## 2022-08-03 ENCOUNTER — Encounter: Payer: Self-pay | Admitting: Nurse Practitioner

## 2022-08-03 VITALS — BP 136/93 | HR 74 | Resp 18 | Ht 69.0 in | Wt 238.0 lb

## 2022-08-03 DIAGNOSIS — N838 Other noninflammatory disorders of ovary, fallopian tube and broad ligament: Secondary | ICD-10-CM | POA: Diagnosis not present

## 2022-08-03 DIAGNOSIS — M545 Low back pain, unspecified: Secondary | ICD-10-CM

## 2022-08-03 DIAGNOSIS — R102 Pelvic and perineal pain: Secondary | ICD-10-CM | POA: Diagnosis not present

## 2022-08-03 DIAGNOSIS — Z23 Encounter for immunization: Secondary | ICD-10-CM | POA: Diagnosis not present

## 2022-08-03 NOTE — Progress Notes (Signed)
Established patient visit   Patient: Kimberly Armstrong   DOB: [birth year]   41 y.o. Female  MRN: [id number] Visit Date: 08/03/2022   Chief Complaint  Patient presents with   Pelvic Pain   Subjective    HPI    The patient has been having this problem for over a year.  MRI of the pelvis was done 08/2021 showed -  1. Patient is presumed to be status post supracervical hysterectomy. Close correlation with surgical records recommended. 1.9 x 1.7 x 1.8 cm complex cystic mass is identified in the central cervical stroma. Cervical canal not well demonstrated in this lesion may be related to the canal or in the central stroma. The lesion may represent a tight cluster of multiple cysts or a single multicystic lesion. Large nabothian cyst would be a consideration. Cystic cervicitis would also be a consideration. Adenoma malignum/neoplasm cannot be excluded by imaging. 2. A second small nodular component along the anterior wall of the cervix is immediately anterior to the above described lesion and measures up to 1.3 cm in maximum dimension. This lesion does appear to generates some mass-effect on the posterior wall of the urinary bladder. While imaging features are nonspecific, this does not appear overtly aggressive and may simply represent scar.   She did see GYN who did not feel as though this was the cause of her pelvic pain. Referred her to urology and GI.  -she is currently taking physical therapy for the pelvic pain and urinary symptoms.    Patient was scheduled to go back to work on 10/302023 after being on FMLA since September with the onset of worsening pelvic pain. She is no yet able to go back to work. Will postpone her return to work until after her initial appointment with new GYN provider. GYN provider appointment was 07/19/2022.  -started on orlissa 200 mg -medication has been on backorder. Reporting pelvic pain as 8/10 in severity  -will extend FMLA through March so  she has opportunity to start medication and follow up with GYN in 09/2021.    Medications: Outpatient Medications Prior to Visit  Medication Sig   albuterol (VENTOLIN HFA) 108 (90 Base) MCG/ACT inhaler TAKE 2 PUFFS BY MOUTH EVERY 6 HOURS AS NEEDED FOR WHEEZE OR SHORTNESS OF BREATH   budesonide-formoterol (SYMBICORT) 160-4.5 MCG/ACT inhaler Inhale 2 puffs into the lungs 2 (two) times daily.   cetirizine (ZYRTEC) 10 MG tablet Take 1 tablet (10 mg total) by mouth daily.   cyclobenzaprine (FLEXERIL) 10 MG tablet Take 1/2 to 1 tablet po QHS prn muscle pain   diclofenac (VOLTAREN) 75 MG EC tablet TAKE 1 TABLET BY MOUTH TWICE A DAY   famotidine (PEPCID) 20 MG tablet Take 1 tablet (20 mg total) by mouth 2 (two) times daily. To reduce itching and hives.   fluticasone (FLONASE) 50 MCG/ACT nasal spray SPRAY 2 SPRAYS INTO EACH NOSTRIL EVERY DAY   hydrOXYzine (ATARAX) 10 MG tablet Take 1 tablet (10 mg total) by mouth 3 (three) times daily as needed. For itching and rash.   montelukast (SINGULAIR) 10 MG tablet Take 1 tablet (10 mg total) by mouth at bedtime.   phenazopyridine (PYRIDIUM) 200 MG tablet Take 1 tablet (200 mg total) by mouth 3 (three) times daily as needed for pain.   triamcinolone ointment (KENALOG) 0.5 % APPLY SMALL AMOUNT TO AFFECTED AREAS TWICE DAILY FOR ITCHING   Elagolix Sodium (ORILISSA) 200 MG TABS Take 1 tablet by mouth 2 (two) times daily. (Patient not taking: Reported on  08/03/2022)   ibuprofen (ADVIL) 600 MG tablet Take 1 tablet (600 mg total) by mouth every 8 (eight) hours as needed. (Patient not taking: Reported on 08/03/2022)   Sertraline HCl (ZOLOFT PO) Take 10 mg by mouth. (Patient not taking: Reported on 08/03/2022)   No facility-administered medications prior to visit.    Review of Systems  Constitutional:  Positive for activity change and fatigue.  Gastrointestinal:  Positive for abdominal pain.  Genitourinary:  Positive for dyspareunia, dysuria and pelvic pain.   Musculoskeletal:  Positive for back pain and myalgias.  All other systems reviewed and are negative.   Last CBC Lab Results  Component Value Date   WBC 6.6 03/22/2022   HGB 15.9 (H) 03/22/2022   HCT 46.6 (H) 03/22/2022   MCV 89.1 03/22/2022   MCH 30.4 03/22/2022   RDW 11.9 03/22/2022   PLT 196 07/62/2633   Last metabolic panel Lab Results  Component Value Date   GLUCOSE 128 (H) 03/22/2022   NA 138 03/22/2022   K 3.5 03/22/2022   CL 107 03/22/2022   CO2 25 03/22/2022   BUN 8 03/22/2022   CREATININE 0.84 03/22/2022   GFRNONAA >60 03/22/2022   CALCIUM 8.8 (L) 03/22/2022   PROT 7.1 03/22/2022   ALBUMIN 4.0 03/22/2022   LABGLOB 2.7 12/10/2020   AGRATIO 1.6 12/10/2020   BILITOT 0.7 03/22/2022   ALKPHOS 64 03/22/2022   AST 20 03/22/2022   ALT 23 03/22/2022   ANIONGAP 6 03/22/2022   Last lipids Lab Results  Component Value Date   CHOL 130 12/10/2020   HDL 33 (L) 12/10/2020   LDLCALC 76 12/10/2020   TRIG 115 12/10/2020   CHOLHDL 3.9 12/10/2020   Last hemoglobin A1c Lab Results  Component Value Date   HGBA1C 5.6 12/10/2020   Last thyroid functions Lab Results  Component Value Date   TSH 1.890 12/10/2020      Objective     Today's Vitals   08/03/22 1521  BP: (Abnormal) 136/93  Pulse: 74  Resp: 18  SpO2: 99%  Weight: 238 lb (108 kg)  Height: '5\' 9"'$  (1.753 m)  PainSc: 8   PainLoc: Groin   Body mass index is 35.15 kg/m.  BP Readings from Last 3 Encounters:  08/03/22 (Abnormal) 136/93  07/19/22 128/86  06/29/22 130/81    Wt Readings from Last 3 Encounters:  08/03/22 238 lb (108 kg)  07/19/22 236 lb (107 kg)  06/29/22 234 lb 12.8 oz (106.5 kg)    Physical Exam Vitals and nursing note reviewed.  Constitutional:      Appearance: Normal appearance. She is well-developed. She is ill-appearing.  HENT:     Head: Normocephalic.     Mouth/Throat:     Mouth: Mucous membranes are moist.     Pharynx: Oropharynx is clear.  Eyes:     Pupils: Pupils  are equal, round, and reactive to light.  Cardiovascular:     Rate and Rhythm: Normal rate and regular rhythm.     Pulses: Normal pulses.     Heart sounds: Normal heart sounds.  Pulmonary:     Effort: Pulmonary effort is normal.     Breath sounds: Normal breath sounds.  Abdominal:     General: Bowel sounds are normal. There is no distension.     Palpations: Abdomen is soft. There is no mass.     Tenderness: There is abdominal tenderness. There is no right CVA tenderness, left CVA tenderness, guarding or rebound.     Hernia: No hernia  is present.     Comments: : Patinet having suprapubic tenderness with palpation.    Musculoskeletal:        General: Normal range of motion.     Cervical back: Normal range of motion and neck supple.  Lymphadenopathy:     Cervical: No cervical adenopathy.  Skin:    General: Skin is warm and dry.     Capillary Refill: Capillary refill takes less than 2 seconds.  Neurological:     General: No focal deficit present.     Mental Status: She is alert and oriented to person, place, and time.  Psychiatric:        Mood and Affect: Mood normal.        Behavior: Behavior normal.        Thought Content: Thought content normal.        Judgment: Judgment normal.      Assessment & Plan    1. Pelvic pain in female Patient seeing new GYN provider for further evaluation and treatment of pelvic pain and ovarian hypertrophy. Is starting on new medication to treat symptoms and will follow up with new provider 09/2022. Will extend FMLA through November 26, 2022. This will allow time for medication to take effect and she has follow up with new GYN provider.   2. Ovarian hypertrophy Concern for endometriosis. Patient seeing new GYN provider for further evaluation and treatment of pelvic pain and ovarian hypertrophy. Is starting on new medication to treat symptoms and will follow up with new provider 09/2022. Will extend FMLA through November 26, 2022. This will allow time for  medication to take effect and she has follow up with new GYN provider.   3. Midline low back pain without sciatica, unspecified chronicity Low back pain is likely secondary to pelvic pain and ovarian hypertrophy. Concern for endometriosis. Recently started on new medication and should have FMLA extended through November 26, 2022 so that medication may have time to become effective and she is able to follow up with both GYN provider and primary care provider.   4. Need for influenza vaccination Flu vaccine administered during today's visit.  - Flu Vaccine QUAD 6+ mos PF IM (Fluarix Quad PF)    Problem List Items Addressed This Visit       Endocrine   Ovarian hypertrophy     Other   Low back pain   Pelvic pain in female - Primary   Other Visit Diagnoses     Need for influenza vaccination       Relevant Orders   Flu Vaccine QUAD 6+ mos PF IM (Fluarix Quad PF) (Completed)        Return in about 3 months (around 11/02/2022) for health maintenance exam - can we change her current appointment fro [id number] to early March. Ronnell Freshwater, NP  [facility] at [facility] [phone] (phone) [phone] (fax)  [place]

## 2022-08-04 ENCOUNTER — Encounter: Payer: Self-pay | Admitting: Nurse Practitioner

## 2022-08-06 ENCOUNTER — Ambulatory Visit: Payer: Managed Care, Other (non HMO) | Attending: Urology

## 2022-08-06 DIAGNOSIS — M6281 Muscle weakness (generalized): Secondary | ICD-10-CM | POA: Insufficient documentation

## 2022-08-06 DIAGNOSIS — R278 Other lack of coordination: Secondary | ICD-10-CM | POA: Insufficient documentation

## 2022-08-06 DIAGNOSIS — R102 Pelvic and perineal pain: Secondary | ICD-10-CM | POA: Diagnosis present

## 2022-08-06 DIAGNOSIS — M6289 Other specified disorders of muscle: Secondary | ICD-10-CM | POA: Diagnosis present

## 2022-08-06 NOTE — Therapy (Signed)
OUTPATIENT PHYSICAL THERAPY FEMALE PELVIC DISCHARGE   Patient Name: Kimberly Armstrong MRN: [id number] DOB:July 23, 1981, 41 y.o., female Today's Date: 08/06/2022   PT End of Session - 08/06/22 0749     Visit Number 6    Number of Visits 10    Date for PT Re-Evaluation 07/30/22    Authorization Type IE: 05/21/22    PT Start Time 0800    PT Stop Time 0840    PT Time Calculation (min) 40 min    Activity Tolerance Patient tolerated treatment well             Past Medical History:  Diagnosis Date   Asthma    Past Surgical History:  Procedure Laterality Date   ABDOMINAL HYSTERECTOMY N/A    [employer] 11/30/2020   BACK SURGERY     carpel tunnel     CESAREAN SECTION     CESAREAN SECTION N/A    [employer] 11/30/2020   ENDOMETRIAL ABLATION     EYE SURGERY N/A    [employer] 11/30/2020   PARTIAL HYSTERECTOMY     SPINE SURGERY N/A    [employer] 11/30/2020   TUBAL LIGATION N/A    [employer] 11/30/2020   Patient Active Problem List   Diagnosis Date Noted   Fall at home 06/24/2022   Coccyx pain 06/24/2022   Urinary tract infection with hematuria 06/16/2022   Malaise and fatigue 06/16/2022   Pelvic pain in female 11/17/2021   Abdominal pain in female 11/17/2021   Renal mass, left 10/05/2021   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   Tobacco use disorder, continuous 10/05/2021   Raynaud's phenomenon without gangrene 10/05/2021   Generalized abdominal tenderness without rebound tenderness 09/07/2021   Seasonal allergies 09/07/2021   Acute left-sided low back pain without sciatica 09/07/2021   Cold sore 09/07/2021   Encounter for well woman exam with routine gynecological exam 03/15/2021   Neck muscle spasm 03/15/2021   Mild intermittent asthma without complication 84/78/4128   Body mass index (BMI) of 33.0-33.9 in adult 03/15/2021   Screening for STD (sexually transmitted disease) 03/15/2021   Need for Tdap vaccination 03/15/2021   Encounter to establish care  12/03/2020   Low back pain 12/03/2020   Moderate cigarette smoker 12/03/2020   Encounter for screening mammogram for breast cancer 12/03/2020   Asthma 08/23/1981    PCP: Ronnell Freshwater, NP  REFERRING PROVIDER: Hollice Espy, MD  REFERRING DIAG:  R33.9 (ICD-10-CM) - Incomplete bladder emptying  R10.2 (ICD-10-CM) - Pelvic pain in female  M62.89 (ICD-10-CM) - Pelvic floor dysfunction in female   THERAPY DIAG:  Pelvic floor dysfunction  Other lack of coordination  Pelvic pain  Muscle weakness (generalized)  Rationale for Evaluation and Treatment: Rehabilitation  ONSET DATE: 2 years  PRECAUTIONS: None  WEIGHT BEARING RESTRICTIONS: No  FALLS:  Has patient fallen in last 6 months? 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 Pt went to the urgent care on 05/20/22 and dx with kidney infection. Pt is currently on antibiotics. This is the second time she has had a kidney infection. Pt notices when she initiates urination it creates a cramping and occasional sharp feeling in the lower abdomen. Pt also feels that BMs are at times difficult to release. This happens intermittently and Pt has tried to change her diet to help emptying bowels/bladder. These episodes are now happening about every week now which is different from every month.    Pain location: Deep  Pain type: dull and sharp Pain description: sharp, dull, and stabbing   Aggravating factors: Pt cannot think of anything Relieving factors: rest   PATIENT GOALS: will assess next visit due to time constraints    UROLOGICAL HISTORY Fluid intake: Yes   Pain with  urination: Yes; 95% Fully empty bladder: No Stream: Strong Urgency: Yes Frequency: 6-8x Nocturia: every hour  Leakage: Coughing, Sneezing, and Laughing Pads: No Bladder control (0-10): 5/10  GASTROINTESTINAL HISTORY Deferred 2/2 time constraints  Pain with bowel movement:  Type of bowel movement: Frequency:  Fully empty rectum:    SEXUAL HISTORY/FUNCTION Pain with intercourse: Initial Penetration and During Penetration Ability to have vaginal penetration:  Yes: occasional pain Able to achieve orgasm?: Yes, occasional pain too   OBSTETRICAL HISTORY Vaginal deliveries: G4P3 C-section deliveries: 3 c-sections Currently pregnant: No  GYNECOLOGICAL HISTORY Hysterectomy: yes, abdominal hysterectomy  Pelvic Organ Prolapse: None Pain with exam: yes no Heaviness/pressure: no  SUBJECTIVE:  Pt has seen a new GYN in 07/19/22 and after review of all the results of previous ultrasounds, provider told Pt more than likely what is going on is endometriosis. Pt was given a surgical option and non-surgical option (medications) and Pt opted for the medication. Pt has stated the pelvic pain has happened at least 1x per week. Pt is taking Orilissa medication for moderate to severe endometriosis pain.    PAIN:  Are you having pain? No NPRS scale: 0/10    OBJECTIVE:    COGNITION: Overall cognitive status: Within functional limits for tasks assessed     POSTURE:   Iliac crest height: L iliac crest higher, L shoulder elevation Pelvic obliquity: WNL   RANGE OF MOTION:    (Norm range in degrees)  LEFT 06/11/22 RIGHT 06/11/22  Lumbar forward flexion (65):  WNL    Lumbar extension (30): WNL    Lumbar lateral flexion (25):  WNL WNL  Thoracic and Lumbar rotation (30 degrees):    WNL WNL  Hip Flexion (0-125):   WNL WNL  Hip IR (0-45):  Restricted compared to R WNL  Hip ER (0-45):  WNL WNL  Hip Adduction:      Hip Abduction (0-40):  WNL WNL  Hip extension (0-15):     (*= pain,  Blank rows = not tested)   STRENGTH: MMT    RLE 06/11/22 LLE 06/11/22  Hip Flexion 5 5  Hip Extension    Hip Abduction     Hip Adduction     Hip ER  5 5  Hip IR  5 5  Knee Extension 5 5  Knee Flexion 5 5  Dorsiflexion  Plantarflexion (seated) 5 5  (*= pain, Blank rows = not tested)   SPECIAL TESTS:  FABER (SN 81): negative B FADIR (SN 94): negative B   PALPATION:  Assessment of lumbar spine  Hypomobile at L3-L5 causing pain down the L buttock, could not tolerate Grade I mobs  L hypomobile SIJ joint compared to R and some pain, 2/10  Coccyx Pain at tailbone, 2/10 and L ligamentous structures and muscular  When asked to "bear down" able to feel coccyx extension    Abdominal:  Diastasis: none Scar mobility: c-section x3, significant scar restriction above the scar and at the scar. From R ASIS close to L ASIS   EXTERNAL PELVIC EXAM: Patient educated on the purpose of the pelvic exam and articulated understanding; patient consented to the exam verbally.  Breath coordination: present Voluntary Contraction: present, 4/5 MMT  Relaxation: full Perineal movement with sustained IAP increase ("bear down"): no change Perineal movement with rapid IAP increase ("cough"): no change (0= no contraction, 1= flicker, 2= weak squeeze, 3= fair squeeze with lift, 4= good squeeze and lift against resistance, 5= strong squeeze against strong resistance)    TODAY'S TREATMENT:  Neuromuscular Re-education: Reassessment of FOTO: FOTO Urinary Problem - IE: 62, Today: 63, but off of FOTO responses and subjective report Pt has made significant progress FOTO Bowel Constipation - IE: 52, Today: 68  Supine hooklying diaphragmatic breathing with VCs and TCs for downregulation of the nervous system and improved management of IAP  EXTERNAL PELVIC EXAM: Patient educated on the purpose of the pelvic exam and articulated understanding; patient consented to the exam verbally.  Breath  coordination: present Relaxation: full Perineal movement with sustained IAP increase ("bear down"): descent, (IE: no change)  Discussion on continuing with relaxation techniques/mindfulness techniques especially while toileting. Gave Nari Clemmons audio tracks via Youtube to aid with relaxation techniques.   Discussion on asking for referral for PFPT next year if would like to continue with [facility] in [place] as it is closer to home.   Patient response to interventions: Pt comfortable to discharge today   Patient Education:  Patient provided with HEP including: no change. Patient educated throughout session on appropriate technique and form using multi-modal cueing, HEP, and activity modification. Patient will benefit from further education in order to maximize compliance and understanding for long-term therapeutic gains.    ASSESSMENT:  Clinical Impression: Patient arrives to clinic with excellent motivation to participate in today's discharge. Pt has demonstrated significant improvement since IE on 05/21/22 in relation to IAP management, PFM coordination, PFM extensibility, pain, scar mobility, and posture. Pt's recent visit with new GYN stated Pt's symptoms and last ultrasound report is most consistent with endometriosis. Pt opted for non-surgical option via medication Freida Busman) and told to take for 3 months to see if aids with pelvic pain. Worst pelvic pain has reduced to a 7/10 from a 9/10 on IE and Pt reports practicing HEP (PFM lengthening/pain modulation techniques which aids temporarily). FOTO Bowel Constipation increased by 16 points compared to IE (52). Although FOTO Urinary Problem only detected a one point change, based on FOTO responses and subjective report, Pt has had no instances of SUI and has been able to only wake 1x/night compared to every hour at night initially. Pt has had no urinary incontinence. Upon PFM external assessment, Pt is able to demonstrate full PFM  lengthening with sustained IAP increase "bear down" compared to IE: no change. Pt met 4/6 goals created since IE. Pt understands seeking PFPT in  the new year if would like to continue focusing on pain modulation techniques and decreasing PFM tension. Pt agrees and is adequate for discharge.    Objective Impairments: decreased coordination, decreased strength, increased fascial restrictions, increased muscle spasms, and pain.   Activity Limitations: sleeping, continence, toileting, and locomotion level  Personal Factors: Age, Behavior pattern, Past/current experiences, Time since onset of injury/illness/exacerbation, and 1-2 comorbidities: hx of endometriosis, asthma  are also affecting patient's functional outcome.   Rehab Potential: Good  Clinical Decision Making: Evolving/moderate complexity  Evaluation Complexity: Moderate   GOALS: Goals reviewed with patient? Yes  SHORT TERM GOALS: Target date: 06/25/2022  Patient will demonstrate independent and coordinated diaphragmatic breathing in supine with a 1:2 breathing pattern for improved down-regulation of the nervous system and improved management of intra-abdominal pressures in order to increase function at home and in the community. Baseline: present and consistent in supine, required cueing in seated due to superficial abdominal activation; full PFM relaxation and fully present breath coordination  Goal status: MET    LONG TERM GOALS: Target date: 07/30/2022   Patient will score  >/= 68 and 57 on FOTO Urinary Problem and Bowel Constipation  in order to demonstrate improved IAP management, decreased pain, improved PFM coordination, and overall improved QOL.  Baseline: 62 and 52; 63 and 68  Goal status: NOT MET  2.  Patient will decrease worst pain as reported on NPRS by at least 2 points to demonstrate clinically significant reduction in pain in order to restore/improve function and overall QOL. Baseline: 9/10 pelvic pain; (12/8):  7/10  Goal status: MET  3.  Patient will report a decrease in painful/uncomfortable bladder emptying to 50% or less over the course of a week in order to demonstrate improved PFM coordination, improved PFM extensibility, and overall improved ability to eliminate urine/feces without limitation or pain.  Baseline: 95% or more in a week; (12/8): 75% a week, some days no pain, some days yes Goal status: NOT MET  4.  Patient will report less than 5 incidents of stress urinary incontinence over the course of 3 weeks while coughing/sneezing/laughing in order to demonstrate improved PFM coordination, strength, and function for improved overall QOL. Baseline: dribbling with all the above and has to change undergarments from time to time; (12/8): none  Goal status: MET  5.  Patient will report a decrease in voiding intervals during the night in order to demonstrate improved control of the bladder, PFM coordination, sleep quality, and overall QOL.  Baseline: every hour during the night; (12/8): 1x night  Goal status: MET   PLAN: PT Frequency: 1x/week  PT Duration: 10 weeks  Planned Interventions: Therapeutic exercises, Therapeutic activity, Neuromuscular re-education, Balance training, Gait training, Patient/Family education, Self Care, Joint mobilization, Spinal mobilization, Cryotherapy, Moist heat, scar mobilization, Taping, and Manual therapy    Aleksa Collinsworth, PT, DPT  08/06/2022, 9:19 AM

## 2022-08-13 ENCOUNTER — Ambulatory Visit: Payer: Managed Care, Other (non HMO) | Attending: Urology

## 2022-08-16 ENCOUNTER — Encounter: Payer: Self-pay | Admitting: Nurse Practitioner

## 2022-08-17 ENCOUNTER — Telehealth: Payer: Self-pay | Admitting: *Deleted

## 2022-08-17 NOTE — Telephone Encounter (Signed)
Pt calling to verify that her paperwork that you have was completed and sent in.  She said that she gave it to you last month and that if it is not complete if you could do so and send it today.  She said it was due on 08/15/2022. Jimya Ciani Zimmerman Rumple, CMA

## 2022-08-18 ENCOUNTER — Telehealth: Payer: Self-pay

## 2022-08-18 DIAGNOSIS — N838 Other noninflammatory disorders of ovary, fallopian tube and broad ligament: Secondary | ICD-10-CM | POA: Insufficient documentation

## 2022-08-18 NOTE — Telephone Encounter (Signed)
Called pt LVM to contact the office °

## 2022-08-18 NOTE — Telephone Encounter (Signed)
I have just given the requested documentation to Katrina to fax to employer.

## 2022-08-20 ENCOUNTER — Ambulatory Visit: Payer: Managed Care, Other (non HMO)

## 2022-08-25 ENCOUNTER — Encounter: Payer: Self-pay | Admitting: Nurse Practitioner

## 2022-08-25 ENCOUNTER — Other Ambulatory Visit: Payer: Self-pay | Admitting: Nurse Practitioner

## 2022-08-25 DIAGNOSIS — L209 Atopic dermatitis, unspecified: Secondary | ICD-10-CM

## 2022-08-27 ENCOUNTER — Ambulatory Visit: Payer: Managed Care, Other (non HMO)

## 2022-09-01 ENCOUNTER — Encounter (HOSPITAL_BASED_OUTPATIENT_CLINIC_OR_DEPARTMENT_OTHER): Payer: Self-pay

## 2022-09-01 ENCOUNTER — Other Ambulatory Visit: Payer: Self-pay

## 2022-09-01 ENCOUNTER — Emergency Department (HOSPITAL_BASED_OUTPATIENT_CLINIC_OR_DEPARTMENT_OTHER)
Admission: EM | Admit: 2022-09-01 | Discharge: 2022-09-01 | Disposition: A | Discharge status: Home / Self Care | Payer: Managed Care, Other (non HMO) | Attending: Emergency Medicine | Admitting: Emergency Medicine

## 2022-09-01 DIAGNOSIS — R300 Dysuria: Secondary | ICD-10-CM | POA: Diagnosis not present

## 2022-09-01 DIAGNOSIS — R1030 Lower abdominal pain, unspecified: Secondary | ICD-10-CM | POA: Diagnosis not present

## 2022-09-01 DIAGNOSIS — J45909 Unspecified asthma, uncomplicated: Secondary | ICD-10-CM | POA: Diagnosis not present

## 2022-09-01 LAB — URINALYSIS, MICROSCOPIC (REFLEX)

## 2022-09-01 LAB — URINALYSIS, ROUTINE W REFLEX MICROSCOPIC
Bilirubin Urine: NEGATIVE
Glucose, UA: NEGATIVE mg/dL
Ketones, ur: NEGATIVE mg/dL
Leukocytes,Ua: NEGATIVE
Nitrite: NEGATIVE
Protein, ur: NEGATIVE mg/dL
Specific Gravity, Urine: 1.01 (ref 1.005–1.030)
pH: 7 (ref 5.0–8.0)

## 2022-09-01 MED ORDER — IBUPROFEN 800 MG PO TABS: 800.0000 mg | ORAL_TABLET | Freq: Three times a day (TID) | ORAL | 0 refills | Status: DC | PRN

## 2022-09-01 MED ORDER — SENNOSIDES-DOCUSATE SODIUM 8.6-50 MG PO TABS: 1.0000 | ORAL_TABLET | Freq: Every evening | ORAL | 0 refills | Status: DC | PRN

## 2022-09-01 MED ORDER — DICYCLOMINE HCL 20 MG PO TABS: 20.0000 mg | ORAL_TABLET | Freq: Three times a day (TID) | ORAL | 0 refills | Status: DC | PRN

## 2022-09-01 MED ORDER — KETOROLAC TROMETHAMINE 30 MG/ML IJ SOLN
30.0000 mg | Freq: Once | INTRAMUSCULAR | Status: AC
  Administered 2022-09-01: 30 mg via INTRAMUSCULAR
  Filled 2022-09-01: qty 1

## 2022-09-01 NOTE — Discharge Instructions (Signed)
You were seen in the emergency department today with abdominal discomfort.  I suspect this may be due to your endometriosis and will have you to continue discussions with your OB/GYN regarding management options.  I am calling in 2 prescriptions to help with pain including 800 mg ibuprofen and Bentyl.  I have also called in some constipation medicine if you need some additional relief from constipation.   Return with any new or suddenly worsening symptoms.

## 2022-09-01 NOTE — ED Provider Notes (Signed)
Emergency Department Provider Note   I have reviewed the triage vital signs and the nursing notes.   HISTORY  Chief Complaint Abdominal Pain   HPI Kimberly Armstrong is a 42 y.o. female presents emergency department for evaluation of mid to lower abdominal pain with some mild dysuria.  Notes a history of endometriosis with similar pain intermittently in the past.  She is currently following with OB/GYN and has been on some new medication to help control symptoms but notes this seems to not be working as well.  Denies any heavy vaginal bleeding.  No frequency or urgency.  No shaking chills or fevers.  Past Medical History:  Diagnosis Date   Asthma     Review of Systems  Constitutional: No fever/chills Cardiovascular: Denies chest pain. Respiratory: Denies shortness of breath. Gastrointestinal: Positive lower abdominal pain.  No nausea, no vomiting.  No diarrhea.  No constipation. Genitourinary: Negative for dysuria. Musculoskeletal: Negative for back pain. Skin: Negative for rash. Neurological: Negative for headaches, focal weakness or numbness.   ____________________________________________   PHYSICAL EXAM:  VITAL SIGNS: ED Triage Vitals  Enc Vitals Group     BP 09/01/22 0857 (!) 160/118     Pulse Rate 09/01/22 0857 74     Resp 09/01/22 0857 18     Temp 09/01/22 0857 97.9 F (36.6 C)     Temp Source 09/01/22 0857 Oral     SpO2 09/01/22 0857 99 %     Weight 09/01/22 0857 245 lb (111.1 kg)     Height 09/01/22 0857 '5\' 9"'$  (1.753 m)   Constitutional: Alert and oriented. Well appearing and in no acute distress. Eyes: Conjunctivae are normal.  Head: Atraumatic. Nose: No congestion/rhinnorhea. Mouth/Throat: Mucous membranes are moist.  Neck: No stridor.  Cardiovascular: Normal rate, regular rhythm. Good peripheral circulation.  Respiratory: Normal respiratory effort.   Gastrointestinal: Soft and nontender. No distention.  Musculoskeletal: No gross  deformities of extremities. Neurologic:  Normal speech and language.  Skin:  Skin is warm, dry and intact. No rash noted.  ____________________________________________   LABS (all labs ordered are listed, but only abnormal results are displayed)  Labs Reviewed  URINALYSIS, ROUTINE W REFLEX MICROSCOPIC - Abnormal; Notable for the following components:      Result Value   Hgb urine dipstick TRACE (*)    All other components within normal limits  URINALYSIS, MICROSCOPIC (REFLEX) - Abnormal; Notable for the following components:   Bacteria, UA FEW (*)    All other components within normal limits    ____________________________________________   PROCEDURES  Procedure(s) performed:   Procedures  None  ____________________________________________   INITIAL IMPRESSION / ASSESSMENT AND PLAN / ED COURSE  Pertinent labs & imaging results that were available during my care of the patient were reviewed by me and considered in my medical decision making (see chart for details).   This patient is Presenting for Evaluation of abdominal pain, which does require a range of treatment options, and is a complaint that involves a high risk of morbidity and mortality.  The Differential Diagnoses includes but is not exclusive to ectopic pregnancy, ovarian cyst, ovarian torsion, acute appendicitis, urinary tract infection, endometriosis, bowel obstruction, hernia, colitis, renal colic, gastroenteritis, volvulus etc.   I decided to review pertinent External Data, and in summary patient with prior ED visits and outpatient encounters for similar pelvic pain.    Clinical Laboratory Tests Ordered, included UA without evidence of UTI.  Patient with history of partial hysterectomy and urine pregnancy  not obtained.   Medical Decision Making: Summary:  Patient presents emergency department with lower abdominal pain similar to her endometriosis discomfort.  We discussed repeat imaging such as CT or  ultrasound but patient has had multiple imaging modalities in the past.  She is following with OB/GYN and considering possible surgical intervention for this endometriosis pain.  Decision making discussion with patient and ultimately decided to defer imaging for now. Pain controlled in the ED. She plans to reach out to her Gyn team in the AM.   Patient's presentation is most consistent with acute presentation with potential threat to life or bodily function.   Disposition: discharge  ____________________________________________  FINAL CLINICAL IMPRESSION(S) / ED DIAGNOSES  Final diagnoses:  Lower abdominal pain     NEW OUTPATIENT MEDICATIONS STARTED DURING THIS VISIT:  Discharge Medication List as of 09/01/2022 10:09 AM     START taking these medications   Details  dicyclomine (BENTYL) 20 MG tablet Take 1 tablet (20 mg total) by mouth 3 (three) times daily as needed for spasms., Starting Wed 09/01/2022, Normal    senna-docusate (SENOKOT-S) 8.6-50 MG tablet Take 1 tablet by mouth at bedtime as needed for mild constipation or moderate constipation., Starting Wed 09/01/2022, Normal        Note:  This document was prepared using Dragon voice recognition software and may include unintentional dictation errors.  Nanda Quinton, MD, [facility] Emergency Medicine    Chrystina Naff, [facility], MD 09/07/22 0001

## 2022-09-01 NOTE — ED Triage Notes (Signed)
C/o lower mid abdominal pain since this morning while urinating.[name] Hx of endometriosis. States has been constipated and trouble urinating. Nausea

## 2022-09-01 NOTE — ED Notes (Signed)
Discharge instructions reviewed with patient. Patient verbalizes understanding, no further questions at this time. Medications/prescriptions and follow up information provided. No acute distress noted at time of departure.  

## 2022-09-07 ENCOUNTER — Encounter: Payer: Self-pay | Admitting: Radiology

## 2022-09-07 ENCOUNTER — Ambulatory Visit (INDEPENDENT_AMBULATORY_CARE_PROVIDER_SITE_OTHER): Payer: Managed Care, Other (non HMO) | Admitting: Radiology

## 2022-09-07 VITALS — BP 122/84 | HR 82 | Ht 69.0 in | Wt 236.0 lb

## 2022-09-07 DIAGNOSIS — R102 Pelvic and perineal pain: Secondary | ICD-10-CM | POA: Diagnosis not present

## 2022-09-07 DIAGNOSIS — N809 Endometriosis, unspecified: Secondary | ICD-10-CM

## 2022-09-07 NOTE — Progress Notes (Signed)
Kimberly Armstrong Knee [birth year] [id number]   History:  42 y.o. G4P3 here for follow up after starting [place] for endometriosis. Reports pain has improved but the hot flashes and mood swings are troublesome. She was not a candidate for add back therapy due to smoking status. She is interested in more definitive surgical options.   She originally presents 6 weeks ago with c/o pelvic pain x 5 years. S/p supracervical hyst for failed ablation and menorrhagia. Hx of endometriosis with endometrioma. Pain was cyclic and has become more constant over the past 29month. Often has pain when emptying her bladder and after having a BM. Negative GI workup. Pain with intercourse. Typically the pain is centrally located in the pelvis. NSAIDs no longer help. U/s and Pelvic MRI 1/23  Narrative & Impression  CLINICAL DATA:  Nodules identified in the vaginal cuff on recent ultrasound.   EXAM: MRI PELVIS WITHOUT AND WITH CONTRAST   TECHNIQUE: Multiplanar multisequence MR imaging of the pelvis was performed both before and after administration of intravenous contrast.   CONTRAST:  247mMULTIHANCE GADOBENATE DIMEGLUMINE 529 MG/ML IV SOLN   COMPARISON:  Ultrasound exam 09/10/2021   FINDINGS: Urinary Tract: The urinary bladder appears normal for the degree of distention. No evidence for urethral diverticulum.   Bowel:  Unremarkable visualized pelvic bowel loops.   Vascular/Lymphatic: No pathologically enlarged lymph nodes. No significant vascular abnormality seen.   Reproductive: Uterus is surgically absent in what appears to be a supracervical hysterectomy. There is a 1.9 x 1.7 x 1.8 cm complex cystic mass in the cervix. Cervical canal is not clearly delineated by MR imaging although this lesion does have a central location within the apparent cervical stroma. After IV contrast administration enhancing internal septa or thin mural components from adjacent cystic lesions are evident.   A  second small nodular midline component is seen along the anterior cervix with some indentation of the posterior bladder wall this is immediately anterior to the above described multicystic lesion and measures approximately 1.3 x 1.1 x 0.7 cm (see axial T2 image 20 of series 8).   Right ovary measures 2.0 x 1.2 x 3.3 cm, unremarkable.   Left ovary measures 2.8 x 1.3 x 2.3 cm, unremarkable.   Other:  No intraperitoneal free fluid.   Musculoskeletal: No focal suspicious marrow enhancement within the visualized bony anatomy.   IMPRESSION: 1. Patient is presumed to be status post supracervical hysterectomy. Close correlation with surgical records recommended. 1.9 x 1.7 x 1.8 cm complex cystic mass is identified in the central cervical stroma. Cervical canal not well demonstrated in this lesion may be related to the canal or in the central stroma. The lesion may represent a tight cluster of multiple cysts or a single multicystic lesion. Large nabothian cyst would be a consideration. Cystic cervicitis would also be a consideration. Adenoma malignum/neoplasm cannot be excluded by imaging. 2. A second small nodular component along the anterior wall of the cervix is immediately anterior to the above described lesion and measures up to 1.3 cm in maximum dimension. This lesion does appear to generates some mass-effect on the posterior wall of the urinary bladder. While imaging features are nonspecific, this does not appear overtly aggressive and may simply represent scar. 3. Unremarkable MR appearance of the ovaries.     Electronically Signed   By: ErMisty Stanley.D.   On: 09/17/2021 15:21    Gynecologic History Hysterectomy: menorrhagia  Sexually active: yes  Health Maintenance Last Pap: 2021.  Last mammogram:  2022.    Past medical history, past surgical history, family history and social history were all reviewed and documented in the EPIC chart.  ROS:  A ROS was performed and  pertinent positives and negatives are included.  Exam:  Vitals:   09/07/22 0910  BP: 122/84  Pulse: 82  Weight: 236 lb (107 kg)  Height: '5\' 9"'$  (1.753 m)   Body mass index is 34.85 kg/m.  Physical Exam Vitals and nursing note reviewed.  Constitutional:      Appearance: She is obese.  Neurological:     Mental Status: She is alert.  Psychiatric:        Mood and Affect: Mood normal.        Thought Content: Thought content normal.        Judgment: Judgment normal.      Assessment/Plan:   1. Pelvic pain  2. Endometriosis determined by laparoscopy Interested in surgical options, will schedule with Dr Dellis Filbert to discuss further. In the meantime may decrease Orilissa to once daily dosing to help with symptoms but hopefully decrease side effects.  Kerry Dory WHNP-BC 9:43 AM 09/07/2022

## 2022-09-24 ENCOUNTER — Other Ambulatory Visit: Payer: Self-pay | Admitting: Nurse Practitioner

## 2022-09-24 DIAGNOSIS — J4520 Mild intermittent asthma, uncomplicated: Secondary | ICD-10-CM

## 2022-10-06 ENCOUNTER — Encounter: Payer: Managed Care, Other (non HMO) | Admitting: Nurse Practitioner

## 2022-10-11 ENCOUNTER — Telehealth: Payer: Self-pay

## 2022-10-11 ENCOUNTER — Encounter: Payer: Self-pay | Admitting: Obstetrics & Gynecology

## 2022-10-11 ENCOUNTER — Ambulatory Visit (INDEPENDENT_AMBULATORY_CARE_PROVIDER_SITE_OTHER): Payer: Managed Care, Other (non HMO) | Admitting: Obstetrics & Gynecology

## 2022-10-11 ENCOUNTER — Telehealth: Payer: Self-pay | Admitting: *Deleted

## 2022-10-11 VITALS — BP 142/80 | HR 80

## 2022-10-11 DIAGNOSIS — F17210 Nicotine dependence, cigarettes, uncomplicated: Secondary | ICD-10-CM | POA: Diagnosis not present

## 2022-10-11 DIAGNOSIS — R102 Pelvic and perineal pain: Secondary | ICD-10-CM

## 2022-10-11 DIAGNOSIS — N809 Endometriosis, unspecified: Secondary | ICD-10-CM

## 2022-10-11 DIAGNOSIS — Z90711 Acquired absence of uterus with remaining cervical stump: Secondary | ICD-10-CM | POA: Diagnosis not present

## 2022-10-11 DIAGNOSIS — N889 Noninflammatory disorder of cervix uteri, unspecified: Secondary | ICD-10-CM

## 2022-10-11 NOTE — Progress Notes (Unsigned)
Kimberly Armstrong 31-Mar-1981 [id number]        41 y.o.  [id number]   RP: Pelvic pain worsening x 1 1/2 year/H/O Endometriosis  HPI: S/p supracervical hysterectomy [place] in 2016 for failed ablation and menorrhagia. Patient doesn't know why the cervix was not removed, possibly d/t severe adhesions given her h/o Endometriosis with previously removed Endometriomas.  Her Ovaries were preserved. Patient will obtain the OR report asap. Pain was cyclic x 5 years and has become more constant over the past 18 months. Often has pain when emptying her bladder and after having a BM. Negative GI workup. Pain with intercourse. Typically the pain is centrally located in the pelvis. NSAIDs no longer helping. U/s and Pelvic MRI 1/23.   Patient was started on Orilissa 200 mg BID on 07/19/22.  She is a cigarette smoker.  The pelvic pain improved on it, but the hot flushes and low moods prompted her to decrease to 1 tab daily and then stop.   OB History  Gravida Para Term Preterm AB Living  4 3 3 $ 0 1 3  SAB IAB Ectopic Multiple Live Births  1 0 0 0 3    # Outcome Date GA Lbr Len/2nd Weight Sex Delivery Anes PTL Lv  4 Term           3 Term           2 Term           1 SAB             Past medical history,surgical history, problem list, medications, allergies, family history and social history were all reviewed and documented in the EPIC chart.   Directed ROS with pertinent positives and negatives documented in the history of present illness/assessment and plan.  Exam:  Vitals:   10/11/22 1333  BP: (!) 142/80  Pulse: 80   General appearance:  Normal  CLINICAL DATA:  Nodules identified in the vaginal cuff on recent ultrasound.   EXAM: MRI PELVIS WITHOUT AND WITH CONTRAST   TECHNIQUE: Multiplanar multisequence MR imaging of the pelvis was performed both before and after administration of intravenous contrast.   CONTRAST:  80m MULTIHANCE GADOBENATE DIMEGLUMINE 529 MG/ML IV SOLN    COMPARISON:  Ultrasound exam 09/10/2021   FINDINGS: Urinary Tract: The urinary bladder appears normal for the degree of distention. No evidence for urethral diverticulum.   Bowel:  Unremarkable visualized pelvic bowel loops.   Vascular/Lymphatic: No pathologically enlarged lymph nodes. No significant vascular abnormality seen.   Reproductive: Uterus is surgically absent in what appears to be a supracervical hysterectomy. There is a 1.9 x 1.7 x 1.8 cm complex cystic mass in the cervix. Cervical canal is not clearly delineated by MR imaging although this lesion does have a central location within the apparent cervical stroma. After IV contrast administration enhancing internal septa or thin mural components from adjacent cystic lesions are evident.   A second small nodular midline component is seen along the anterior cervix with some indentation of the posterior bladder wall this is immediately anterior to the above described multicystic lesion and measures approximately 1.3 x 1.1 x 0.7 cm (see axial T2 image 20 of series 8).   Right ovary measures 2.0 x 1.2 x 3.3 cm, unremarkable.   Left ovary measures 2.8 x 1.3 x 2.3 cm, unremarkable.   Other:  No intraperitoneal free fluid.   Musculoskeletal: No focal suspicious marrow enhancement within the visualized bony anatomy.   IMPRESSION: 1. Patient  is presumed to be status post supracervical hysterectomy. Close correlation with surgical records recommended. 1.9 x 1.7 x 1.8 cm complex cystic mass is identified in the central cervical stroma. Cervical canal not well demonstrated in this lesion may be related to the canal or in the central stroma. The lesion may represent a tight cluster of multiple cysts or a single multicystic lesion. Large nabothian cyst would be a consideration. Cystic cervicitis would also be a consideration. Adenoma malignum/neoplasm cannot be excluded by imaging. 2. A second small nodular component along  the anterior wall of the cervix is immediately anterior to the above described lesion and measures up to 1.3 cm in maximum dimension. This lesion does appear to generates some mass-effect on the posterior wall of the urinary bladder. While imaging features are nonspecific, this does not appear overtly aggressive and may simply represent scar. 3. Unremarkable MR appearance of the ovaries.     Electronically Signed   By: Misty Stanley M.D.   On: 09/17/2021 15:21      Assessment/Plan:  42 y.o. [id number]   1. Pelvic pain in female S/p supracervical hysterectomy [place] in 2016 for failed ablation and menorrhagia. Patient doesn't know why the cervix was not removed, possibly d/t severe adhesions given her h/o Endometriosis with previously removed Endometriomas.  Her Ovaries were preserved. Patient will obtain the OR report asap. Pain was cyclic x 5 years and has become more constant over the past 18 months. Often has pain when emptying her bladder and after having a BM. Negative GI workup. Pain with intercourse. Typically the pain is centrally located in the pelvis. NSAIDs no longer helping. U/s and Pelvic MRI 1/23.   1. Patient is presumed to be status post supracervical hysterectomy. Close correlation with surgical records recommended. 1.9 x 1.7 x 1.8 cm complex cystic mass is identified in the central cervical stroma. Cervical canal not well demonstrated in this lesion may be related to the canal or in the central stroma. The lesion may represent a tight cluster of multiple cysts or a single multicystic lesion. Large nabothian cyst would be a consideration. Cystic cervicitis would also be a consideration. Adenoma malignum/neoplasm cannot be excluded by imaging. 2. A second small nodular component along the anterior wall of the cervix is immediately anterior to the above described lesion and measures up to 1.3 cm in maximum dimension. This lesion does appear to generates some mass-effect on the  posterior wall of the urinary bladder. While imaging features are nonspecific, this does not appear overtly aggressive and may simply represent scar. 3. Unremarkable MR appearance of the ovaries.   Patient was started on Orilissa 200 mg BID on 07/19/22.  She is a cigarette smoker.  The pelvic pain improved on it, but the hot flushes and low moods prompted her to decrease to 1 tab daily and then stop.  Cervical/pericervical lesions on Pelvic MRI 08/2021 with h/o Endometriosis and worsening pelvic pains.  S/P LPS for Ovarian Endometrioma removal and then Supracervical abdominal Hysterectomy.  Side effects on Orilissa with hot flushes and low mood.  Patient encouraged to try again with Freida Busman.  DepoLupron discussed.  Surgery and its risks thoroughly reviewed in the context of probable severe adhesions/inflammation from endometriosis and adhesions from previous surgeries with a remnant cervix associated with lesions per MRI.  Given those factors, decision made with patient to refer to [facility] for evaluation/recommendations and possibly surgical management.  2. Endometriosis determined by laparoscopy H/O Ovarian Endometriomas removed by LPS per patient.  3.  S/P laparoscopic supracervical hysterectomy Will obtain OR report.  Suspicion for severe pelvic adhesions explaining that a Supracervical Hysterectomy was performed by laparotomy.  Ovaries preserved.  4. Moderate cigarette smoker   Princess Bruins MD, 1:53 PM 10/11/2022

## 2022-10-11 NOTE — Telephone Encounter (Signed)
-----   Message from Princess Bruins, MD sent at 10/11/2022  2:37 PM EST ----- Regarding: Refer to Gyn-Onco for surgery Pelvic pain x 5 yrs.  H/O Endometriosis.  Supracervical abdominal hysterectomy in [place] in 2016 per patient.  Will obtain the OR report.  Remaining cervix with 2 cystic lesions in/around the cervix per MRI 08/2021.  Started on Orilissa but stopped d/t side effects.  Refer to Gyn-Onco for opinion and surgical management.

## 2022-10-11 NOTE — Telephone Encounter (Signed)
Dr. Marguerita Merles has already placed the referral.  Will route to [facility] to follow.

## 2022-10-11 NOTE — Telephone Encounter (Signed)
Refer to Gyn-Onco for surgery Received: Today Kimberly Bruins, MD  P [facility] Triage; Burnice Logan, RN; Ramond Craver, RMA Pelvic pain x 5 yrs.  H/O Endometriosis.  Supracervical abdominal hysterectomy in [place] in 2016 per patient.  Will obtain the OR report.  Remaining cervix with 2 cystic lesions in/around the cervix per MRI 08/2021.  Started on Orilissa but stopped d/t side effects.  Refer to Gyn-Onco for opinion and surgical management.

## 2022-10-11 NOTE — Telephone Encounter (Signed)
Order for referral placed.   Routing to [place] to f/u on referral.

## 2022-10-12 ENCOUNTER — Encounter: Payer: Self-pay | Admitting: Obstetrics & Gynecology

## 2022-10-12 ENCOUNTER — Telehealth: Payer: Self-pay | Admitting: *Deleted

## 2022-10-12 NOTE — Telephone Encounter (Signed)
LMOM to call the office to schedule a new patient

## 2022-10-12 NOTE — Telephone Encounter (Signed)
Spoke with the patient regarding the referral to GYN oncology. Patient scheduled as new patient with Dr Berline Lopes on 2/22 at 9 am.  Patient given an arrival time of 8:30 am.  Explained to the patient the the doctor will perform a pelvic exam at this visit. Patient given the policy that only one visitor allowed and that visitor must be over 16 yrs are allowed in the [place]. Patient given the address/phone number for the clinic and that the center offers free valet service. Patient aware of the mask mandate.

## 2022-10-12 NOTE — Telephone Encounter (Signed)
Per [facility] medical clearance and records fax to pre service center

## 2022-10-14 NOTE — Telephone Encounter (Signed)
Per review of Epic, patient scheduled to see Dr. Berline Lopes GYN ONC on 10/21/22.   Reviewed with Dr. Dellis Filbert. Have patient return ASAP for PAP and ECC.   Spoke with patient, OV scheduled for 2/16 at 12:15PM. Future 31mof/u with Jami cancelled for 10/26/22.   Patient verbalizes understanding and is agreeable.  Routing to provider for final review. Patient is agreeable to disposition. Will close encounter.   Cc: JWende Crease NP

## 2022-10-15 ENCOUNTER — Other Ambulatory Visit (HOSPITAL_COMMUNITY)
Admission: RE | Admit: 2022-10-15 | Discharge: 2022-10-15 | Disposition: A | Discharge status: Home / Self Care | Payer: Managed Care, Other (non HMO) | Source: Ambulatory Visit | Attending: Obstetrics & Gynecology | Admitting: Obstetrics & Gynecology

## 2022-10-15 ENCOUNTER — Encounter: Payer: Self-pay | Admitting: Obstetrics & Gynecology

## 2022-10-15 ENCOUNTER — Ambulatory Visit (INDEPENDENT_AMBULATORY_CARE_PROVIDER_SITE_OTHER): Payer: Managed Care, Other (non HMO) | Admitting: Obstetrics & Gynecology

## 2022-10-15 VITALS — BP 124/84 | HR 87

## 2022-10-15 DIAGNOSIS — N889 Noninflammatory disorder of cervix uteri, unspecified: Secondary | ICD-10-CM | POA: Insufficient documentation

## 2022-10-15 DIAGNOSIS — N809 Endometriosis, unspecified: Secondary | ICD-10-CM

## 2022-10-15 DIAGNOSIS — R102 Pelvic and perineal pain: Secondary | ICD-10-CM

## 2022-10-15 NOTE — Progress Notes (Signed)
    Kimberly Armstrong 06-Oct-1980 [id number]        42 y.o.  [id number]   RP: Cervical lesions on MRI with pelvic pain, complete evaluation with Gyn exam/ECC/Pap test  HPI: Cervical lesions on MRI with pelvic pain, complete evaluation with Gyn exam/ECC/Pap test.  Patient has not restarted Orilissa.  Gyn-onco appointment scheduled next week.  See note from 10/11/2022.   OB History  Gravida Para Term Preterm AB Living  4 3 3 $ 0 1 3  SAB IAB Ectopic Multiple Live Births  1 0 0 0 3    # Outcome Date GA Lbr Len/2nd Weight Sex Delivery Anes PTL Lv  4 Term           3 Term           2 Term           1 SAB             Past medical history,surgical history, problem list, medications, allergies, family history and social history were all reviewed and documented in the EPIC chart.   Directed ROS with pertinent positives and negatives documented in the history of present illness/assessment and plan.  Exam:  Vitals:   10/15/22 1209  BP: 124/84  Pulse: 87  SpO2: 99%   General appearance:  Normal  Abdomen: Normal  Gynecologic exam: Vulva normal.  Speculum:  Cervix/vagina normal.  Normal secretion, no blood.  Pap reflex/ECC done.  Bimanual exam:  Cervix mobile, mildly tender.  No pelvic mass felt.   Assessment/Plan:  42 y.o. [id number]   1. Cervical lesion Cervical/paracervical cystic lesions per Pelvic MRI 08/2021.  Completed the investigation with a Pap reflex and ECC today.  Pending results.  Referred to [facility], appointment next week. - Cytology - PAP( [facility]) - Surgical pathology( [facility]/ [facility])  2. Pelvic pain in female H/O Endometriosis, s/p Supracervical Abdominal Hysterectomy.  Op report to obtain from [place], patient called the office of the Gyn Surgeon who did the surgery, he is now retired.  No pelvic mass felt on Gyn exam today.  3. Endometriosis determined by laparoscopy Cervical remnant/Bilateral Ovaries preserved.  Pelvic pain/cervical/paracervical  lesions on MRI of pelvis.   Other orders - Multiple Vitamin (MULTIVITAMIN PO); Take by mouth. - B Complex Vitamins (B COMPLEX PO); Take by mouth. drops - Bromelains (BROMELAIN PO); Take by mouth. drops - UNABLE TO FIND; Med Name: pre/probiotic   Princess Bruins MD, 12:16 PM 10/15/2022

## 2022-10-18 ENCOUNTER — Encounter: Payer: Self-pay | Admitting: Gynecologic Oncology

## 2022-10-20 NOTE — Progress Notes (Unsigned)
GYNECOLOGIC ONCOLOGY NEW PATIENT CONSULTATION   Patient Name: Kimberly Armstrong  Patient Age: 42 y.o. Date of Service: 10/21/22 Referring Provider: Princess Bruins, MD  Primary Care Provider: Ronnell Freshwater, NP Consulting Provider: Jeral Pinch, MD   Assessment/Plan:  Premenopausal patient with known history of endometriosis who underwent supracervical hysterectomy approximately 7 years ago with cyclic pelvic pain.  I spent some time speaking with the patient about her prior surgical history, diagnosis of endometriosis, and more recent pelvic pain.  We reviewed MRI findings from her scan over a year ago.  I discussed more likely etiologies for the findings on MRI including cystic lesions of the cervix, such as a nabothian cyst, endometriosis, or scar tissue.  While adenoma malignum is certainly on the differential, we discussed that this is a rare tumor and I think much less likely to be the cause of her symptoms.  The patient is unsure to why she underwent a supracervical hysterectomy.  We had her sign a release of records today which we will send to the hospital in [place] where she had her surgery.  Hopefully between either this and her pathology report, we can determine if significant adhesions were the reason that a supracervical hysterectomy was performed.  We also discussed her recent Pap test and ECC, all of which was very reassuring.  In terms of treatment options moving forward, we discussed scheduling surgery now.  Such as surgery would involve a trachelectomy, most likely that could be done robotically.  The decision about whether to leave or take the ovaries would be an important one.  In the setting of endometriosis, if that is indeed what is causing her pelvic pain, we discussed that definitive treatment includes removing the ovaries given the hormonal stimulation caused by estrogen.  I think it would be reasonable to leave one or both ovaries at the time of surgery  although we could also consider taking her ovaries and putting her on some add back hormonal replacement therapy.  We discussed what trachelectomy would involve.  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  She has had some benefit in terms of more recent over-the-counter medications that she is taking for her symptoms.  I recommended that we get an MRI to assess whether there has been any change to the sizes of the cervical lesions seen or their character that would increase any concern regarding malignancy.  If these areas look more concerning, then we will move towards getting her scheduled for surgery sooner rather than later.  If lesions are similar in size and character or have decreased, we discussed the possibility of surveillance.  In this case, I would recommend starting some progesterone for suppression.  We discussed the role of progesterone in terms of endometriosis treatment and that this could help control her pain, growth of the cervical lesions, or the development of new endometriosis implants.  The patient was interested in starting oral progesterone and repeating an MRI.  We will work to get her surgical records.  I will call her with MRI results and hopefully to review records from [place].  Depending on MRI results and her symptoms, we will decide at that point whether we are moving forward with scheduling surgery or planning to continue with supportive care/progesterone therapy.  A copy of this note was sent to the patient's referring provider.   70 minutes  of total time was spent for this patient encounter, including preparation, face-to-face counseling with the patient and coordination of care, and documentation of the encounter.  Jeral Pinch, MD  Division of Gynecologic Oncology  Department of Obstetrics and Gynecology   [facility]  ___________________________________________  Chief Complaint: No chief complaint on file.   History of Present Illness:  Kimberly Armstrong is a 42 y.o. y.o. female who is seen in consultation at the request of Dr. Dellis Filbert for an evaluation of cervical mass, endometriosis.  The patient had her last delivery by C-section in 2017.  She had a tubal ligation at that time.  Shortly after she delivered, she had an IUD placed that was expelled after several heavy menses.  She denies having any pain prior to this last C-section just irregular and heavy bleeding.  Later in 2017, she underwent partial hysterectomy.  She remembers a discussion about leaving the ovaries in situ but does not remember discussion about leaving the cervix.  The physician who performed her hysterectomy in [place] has since passed away.  She had the surgery done at a [facility] on [address].  Over the last 6 years, she began having pain similar to menstrual cramping.  Initially, this happened once a month with some mild cramping.  Some months she would not have any pain.  She describes this as cramping in her deep pelvis.  Within the last year and a half, she has noticed increased pain as well as increased frequency of the pain.  She now has cramping for almost 2 weeks of every month.  She has been treated for urinary tract infections, has tried changing her diet, without any effect on her pain.  She was recently started on Orilissa at the higher dose.  This helped immediately but she developed significant hot flashes, mood swings, and other symptoms.  She ultimately decided to stop the [place].  She had recurrence of her pain within a couple of days after stopping the medication with some worsening of the pain.  She restarted the Orilissa at half the dose, which helped some but she had persistent symptoms.  She ultimately decided to take a more naturalistic approach.  She stopped the  [place] 3 weeks ago.  She is taking a multivitamin for women, pre and probiotic, and a vitamin B complex.  These interventions have helped significantly.  She denies any severe pain.  If she develops dull pain, she uses Tylenol, ibuprofen, and/or a muscle relaxant.  In early 2023, she had multiple imaging studies to workup her symptoms. 09/10/21: Pelvic ultrasound reveals surgically absent uterus.  In the posterior aspect of the vaginal cuff is a poorly defined hypoechoic nodule measuring 2.2 x 1.5 x 1.7 cm that is incompletely characterized on transabdominal exam.  There is a slightly more hyperechoic nodule in the superior aspect of the vaginal cuff measuring 1.5 x 1.1 x 1.4 cm that causes mass mild effect on the bladder base.  Right ovary measures up to 4.7 cm without dominant cyst lesion.  Left ovary measures up to 6.1 cm again without dominant cystic lesion.  Both ovaries noted to be prominent in size although no definitive cystic or solid lesions seen. 09/17/2021: MRI of the pelvis shows 1.9 x 1.7 x 1.8 cm complex cystic mass in the central cervical stroma.  Cervical canal is not well-demonstrated but this lesion may be related to the canal or in the cervical stroma.  Could represent tiny cluster of multiple cyst  or single multicystic lesion.  Large nabothian cyst would also be a consideration.  Cystic cervicitis is in the differential and adenoma malignancy cannot be excluded.  Second smaller nodular component along the anterior wall of the cervix is immediately anterior to the above lesion and measures up to 1.3 cm.  It generates some mass effect on the posterior wall of the urinary bladder, does not appear overtly aggressive and may simply represent scar.  Unremarkable appearance of the ovaries.  10/15/21: Pap and ECC. Pap was NIML. ECC showed scant benign glandular and squamous tissue.  Patient denies any vaginal bleeding or discharge.  She describes having normal bowel movements but has pain with  her bowel function and has dysuria during the times when she is having menstrual-like cramping.  She denies seeing any blood in her stool.  She denies urinary symptoms outside of what is described above and sometimes feeling like she cannot empty all the way or has to change position to help her empty completely.  She denies any upper abdominal pain.  She endorses a good appetite without nausea or emesis.  She has gained a little weight in the last few months which she attributes to stress given everything going on.  Patient has a history of endometriosis.  Her surgical history is notable for 3 C-sections, the last of which also included a tubal ligation.  She had an endometrial ablation in 2013 for her history of heavy bleeding.  She also had a surgery for endometriosis.  By her description, it sounds like she developed endometriosis within one of her C-section scars and this was excised.  PAST MEDICAL HISTORY:  Past Medical History:  Diagnosis Date   Asthma    TID use currently 2.2024   Endometriosis    Tobacco abuse      PAST SURGICAL HISTORY:  Past Surgical History:  Procedure Laterality Date   BACK SURGERY     carpel tunnel     CESAREAN SECTION     x3   ENDOMETRIAL ABLATION     EYE SURGERY N/A    [facility] 11/30/2020   OTHER SURGICAL HISTORY  2013   removal of scar tissue (endometriosis) from Pfannenstiel incision   PARTIAL HYSTERECTOMY  2017   cervix, ovaries left in situ   SPINE SURGERY N/A    [facility] 11/30/2020   TUBAL LIGATION N/A    [facility] 11/30/2020, at time of last section    OB/GYN HISTORY:  OB History  Gravida Para Term Preterm AB Living  4 3 3 $ 0 1 3  SAB IAB Ectopic Multiple Live Births  1 0 0 0 3    # Outcome Date GA Lbr Len/2nd Weight Sex Delivery Anes PTL Lv  4 Term           3 Term           2 Term           1 SAB             No LMP recorded. Patient has had a hysterectomy.  Age at menarche: 25 Age at menopause: 69 Hx of HRT: Denies Hx of STDs:  Denies Last pap: 2024 History of abnormal pap smears: Denies  SCREENING STUDIES:  Last mammogram: 2022  Last colonoscopy: Never had  MEDICATIONS: Outpatient Encounter Medications as of 10/21/2022  Medication Sig   albuterol (VENTOLIN HFA) 108 (90 Base) MCG/ACT inhaler TAKE 2 PUFFS BY MOUTH EVERY 6 HOURS AS NEEDED FOR WHEEZE OR SHORTNESS OF BREATH  B Complex Vitamins (B COMPLEX PO) Take by mouth. drops   Bromelains (BROMELAIN PO) Take by mouth. drops   budesonide-formoterol (SYMBICORT) 160-4.5 MCG/ACT inhaler Inhale 2 puffs into the lungs 2 (two) times daily.   cetirizine (ZYRTEC) 10 MG tablet Take 1 tablet (10 mg total) by mouth daily.   diclofenac (VOLTAREN) 75 MG EC tablet TAKE 1 TABLET BY MOUTH TWICE A DAY   fluticasone (FLONASE) 50 MCG/ACT nasal spray SPRAY 2 SPRAYS INTO EACH NOSTRIL EVERY DAY   ibuprofen (ADVIL) 800 MG tablet Take 1 tablet (800 mg total) by mouth every 8 (eight) hours as needed.   montelukast (SINGULAIR) 10 MG tablet Take 1 tablet (10 mg total) by mouth at bedtime.   Multiple Vitamin (MULTIVITAMIN PO) Take by mouth.   norethindrone (ORTHO MICRONOR) 0.35 MG tablet Take 1 tablet (0.35 mg total) by mouth daily.   senna-docusate (SENOKOT-S) 8.6-50 MG tablet Take 1 tablet by mouth at bedtime as needed for mild constipation or moderate constipation.   UNABLE TO FIND Med Name: pre/probiotic   [DISCONTINUED] cyclobenzaprine (FLEXERIL) 10 MG tablet Take 1/2 to 1 tablet po QHS prn muscle pain   hydrOXYzine (ATARAX) 10 MG tablet Take 1 tablet (10 mg total) by mouth 3 (three) times daily as needed. For itching and rash. (Patient not taking: Reported on 10/15/2022)   phenazopyridine (PYRIDIUM) 200 MG tablet Take 1 tablet (200 mg total) by mouth 3 (three) times daily as needed for pain. (Patient not taking: Reported on 10/15/2022)   triamcinolone ointment (KENALOG) 0.5 % APPLY SMALL AMOUNT TO AFFECTED AREAS TWICE DAILY FOR ITCHING (Patient not taking: Reported on 10/18/2022)    [DISCONTINUED] Elagolix Sodium (ORILISSA) 200 MG TABS Take 1 tablet by mouth 2 (two) times daily. (Patient not taking: Reported on 10/18/2022)   No facility-administered encounter medications on file as of 10/21/2022.    ALLERGIES:  Allergies  Allergen Reactions   Sulfa Antibiotics Rash    Hives     FAMILY HISTORY:  Family History  Problem Relation Age of Onset   Breast cancer Mother    Heart attack Mother    Hypertension Mother    Diabetes Sister    Breast cancer Maternal Grandmother    Dementia Paternal Grandmother    Colon cancer Neg Hx    Esophageal cancer Neg Hx    Stomach cancer Neg Hx    Colon polyps Neg Hx    Pancreatic cancer Neg Hx      SOCIAL HISTORY:  Social Connections: Not on file    REVIEW OF SYSTEMS:  + Abdominal/pelvic pain, painful urination, occasional dyspareunia. Denies appetite changes, fevers, chills, fatigue, unexplained weight changes. Denies hearing loss, neck lumps or masses, mouth sores, ringing in ears or voice changes. Denies cough or wheezing.  Denies shortness of breath. Denies chest pain or palpitations. Denies leg swelling. Denies abdominal distention, blood in stools, constipation, diarrhea, nausea, vomiting, or early satiety. Denies frequency, hematuria or incontinence. Denies hot flashes, vaginal bleeding or vaginal discharge.   Denies joint pain, back pain or muscle pain/cramps. Denies itching, rash, or wounds. Denies dizziness, headaches, numbness or seizures. Denies swollen lymph nodes or glands, denies easy bruising or bleeding. Denies anxiety, depression, confusion, or decreased concentration.  Physical Exam:  Vital Signs for this encounter:  Blood pressure (!) 156/90, pulse 86, temperature 98.4 F (36.9 C), temperature source Oral, resp. rate 14, weight 241 lb 6.4 oz (109.5 kg), SpO2 100 %. Body mass index is 35.65 kg/m. General: Alert, oriented, no acute distress.  HEENT: Normocephalic, atraumatic. Sclera anicteric.   Chest: Clear to auscultation bilaterally. No wheezes, rhonchi, or rales. Cardiovascular: Regular rate and rhythm, no murmurs, rubs, or gallops.  Abdomen: Obese. Normoactive bowel sounds. Soft, nondistended, nontender to palpation. No masses or hepatosplenomegaly appreciated. No palpable fluid wave.  Multiple well-healed Pfannenstiel incisions. Extremities: Grossly normal range of motion. Warm, well perfused. No edema bilaterally.  Skin: No rashes or lesions.  Lymphatics: No cervical, supraclavicular, or inguinal adenopathy.  GU:  Normal external female genitalia. No lesions. No discharge or bleeding.             Bladder/urethra:  No lesions or masses, well supported bladder             Vagina: Well-rugated, no lesions or masses.             Cervix: Normal appearing, no lesions.  On bimanual exam, patient has pain with palpation of her cervix, similar to deep pelvic pain that she experiences related to her endometriosis.             Uterus: Surgically absent.             Adnexa: No masses appreciated.  Rectal: Tethered posteriorly to the cervix, pain on exam.  LABORATORY AND RADIOLOGIC DATA:  Outside medical records were reviewed to synthesize the above history, along with the history and physical obtained during the visit.   Lab Results  Component Value Date   WBC 6.6 03/22/2022   HGB 15.9 (H) 03/22/2022   HCT 46.6 (H) 03/22/2022   PLT 196 03/22/2022   GLUCOSE 128 (H) 03/22/2022   CHOL 130 12/10/2020   TRIG 115 12/10/2020   HDL 33 (L) 12/10/2020   LDLCALC 76 12/10/2020   ALT 23 03/22/2022   AST 20 03/22/2022   NA 138 03/22/2022   K 3.5 03/22/2022   CL 107 03/22/2022   CREATININE 0.84 03/22/2022   BUN 8 03/22/2022   CO2 25 03/22/2022   TSH 1.890 12/10/2020   HGBA1C 5.6 12/10/2020

## 2022-10-21 ENCOUNTER — Encounter: Payer: Self-pay | Admitting: Gynecologic Oncology

## 2022-10-21 ENCOUNTER — Inpatient Hospital Stay: Payer: Managed Care, Other (non HMO) | Attending: Gynecologic Oncology | Admitting: Gynecologic Oncology

## 2022-10-21 ENCOUNTER — Telehealth: Payer: Self-pay | Admitting: Gynecologic Oncology

## 2022-10-21 ENCOUNTER — Other Ambulatory Visit: Payer: Self-pay | Admitting: Nurse Practitioner

## 2022-10-21 ENCOUNTER — Telehealth: Payer: Self-pay

## 2022-10-21 VITALS — BP 156/90 | HR 86 | Temp 98.4°F | Resp 14 | Wt 241.4 lb

## 2022-10-21 DIAGNOSIS — D390 Neoplasm of uncertain behavior of uterus: Secondary | ICD-10-CM | POA: Diagnosis present

## 2022-10-21 DIAGNOSIS — R309 Painful micturition, unspecified: Secondary | ICD-10-CM | POA: Diagnosis not present

## 2022-10-21 DIAGNOSIS — Z803 Family history of malignant neoplasm of breast: Secondary | ICD-10-CM | POA: Diagnosis not present

## 2022-10-21 DIAGNOSIS — N941 Unspecified dyspareunia: Secondary | ICD-10-CM | POA: Diagnosis not present

## 2022-10-21 DIAGNOSIS — Z90710 Acquired absence of both cervix and uterus: Secondary | ICD-10-CM | POA: Insufficient documentation

## 2022-10-21 DIAGNOSIS — N888 Other specified noninflammatory disorders of cervix uteri: Secondary | ICD-10-CM

## 2022-10-21 DIAGNOSIS — Z79899 Other long term (current) drug therapy: Secondary | ICD-10-CM | POA: Diagnosis not present

## 2022-10-21 DIAGNOSIS — J45909 Unspecified asthma, uncomplicated: Secondary | ICD-10-CM | POA: Insufficient documentation

## 2022-10-21 DIAGNOSIS — R102 Pelvic and perineal pain: Secondary | ICD-10-CM

## 2022-10-21 DIAGNOSIS — F17209 Nicotine dependence, unspecified, with unspecified nicotine-induced disorders: Secondary | ICD-10-CM

## 2022-10-21 DIAGNOSIS — N809 Endometriosis, unspecified: Secondary | ICD-10-CM | POA: Diagnosis not present

## 2022-10-21 DIAGNOSIS — Z87440 Personal history of urinary (tract) infections: Secondary | ICD-10-CM | POA: Insufficient documentation

## 2022-10-21 DIAGNOSIS — E669 Obesity, unspecified: Secondary | ICD-10-CM

## 2022-10-21 DIAGNOSIS — Z720 Tobacco use: Secondary | ICD-10-CM | POA: Diagnosis not present

## 2022-10-21 LAB — CYTOLOGY - PAP
Comment: NEGATIVE
Diagnosis: NEGATIVE
High risk HPV: NEGATIVE

## 2022-10-21 LAB — SURGICAL PATHOLOGY

## 2022-10-21 MED ORDER — NORETHINDRONE 0.35 MG PO TABS: 1.0000 | ORAL_TABLET | Freq: Every day | ORAL | 11 refills | Status: DC

## 2022-10-21 NOTE — Telephone Encounter (Signed)
Records received from [facility] center and given to Dr. Berline Lopes

## 2022-10-21 NOTE — Telephone Encounter (Signed)
Medical records release faxed to [facility]. Phone# [phone]               Fax# [phone] Requesting operative report/ pathology report from hysterectomy from 2017.

## 2022-10-21 NOTE — Telephone Encounter (Signed)
Per Dr. Charisse March request, I called [facility]-cytology department and spoke to [facility], regarding adding HPV (specifically 16,18, 45) to recent 2/16 pap smear. Kenney Houseman states she will add test.

## 2022-10-21 NOTE — Telephone Encounter (Signed)
Received records from [facility] in [place].  Patient was admitted in December 2017 for surgery in the setting of abnormal uterine bleeding and severe dysmenorrhea.  She underwent supracervical hysterectomy, bilateral salpingectomy, and excision of abdominal wall mass via Pfannenstiel incision.  Findings at the time of surgery included some bladder adhesions in the setting of her prior C-sections.  Posterior cul-de-sac was described as "clear".  Normal-appearing bilateral tubes and ovaries.  There is not mention of significant pelvic adhesive disease.  There is not mention of any endometriosis within the pelvis.  It is unclear to me by the operative report why a supracervical hysterectomy was performed.  The report shows proliferative endometrium, no hyperplasia or atypia.  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  Bilateral fallopian tubes without significant pathology.  Abdominal wall mass that had been excised showed benign fibrous tissue and skeletal muscle with fibrous and reactive changes.  No endometriosis, atypia or malignancy.

## 2022-10-21 NOTE — Patient Instructions (Signed)
It was very nice to meet you today.  We will work on getting your records from the hospital where you had your hysterectomy.  Once I have these records and have reviewed your upcoming MRI, I will call you to discuss next steps.  In the meantime, we discussed having you start oral progesterone to hopefully treat your known endometriosis and related symptoms.  We discussed what surgery would entail today, either removing what is left of the cervix and the possibility of removing your ovaries.  If we pursue removing your ovaries at the time of any surgery, I think it would be very reasonable to put you on hormone replacement therapy.

## 2022-10-22 ENCOUNTER — Encounter: Payer: Self-pay | Admitting: Gynecologic Oncology

## 2022-10-22 NOTE — Telephone Encounter (Signed)
Told Ms Weisler that Dr. Berline Lopes said that the Progesterone is a low dose for a low risk for blood clots. Reviewed signs and symptoms of clots in legs with pain, swelling, redness in calf, and or difficulty bearing weight on a leg when waling. If she develops sudden sob pain in chest it could be a clot in lung. She would want to call 911. Pt verbalized understanding. She will precede with medication as prescribed.

## 2022-10-23 ENCOUNTER — Telehealth: Payer: Managed Care, Other (non HMO)

## 2022-10-25 ENCOUNTER — Telehealth: Payer: Self-pay

## 2022-10-25 NOTE — Telephone Encounter (Signed)
Per review of EPIC, patient seen by Dr. Berline Lopes on 10/21/22.   Routing to provider for final review. Will close encounter.

## 2022-10-25 NOTE — Telephone Encounter (Signed)
[facility] called stating Kimberly Armstrong has an MRI scheduled for tomorrow at their facility. Order along with authorization approval faxed to facility. Fax# [phone]

## 2022-10-26 ENCOUNTER — Ambulatory Visit: Payer: Managed Care, Other (non HMO) | Admitting: Radiology

## 2022-10-29 ENCOUNTER — Ambulatory Visit (HOSPITAL_COMMUNITY): Payer: Managed Care, Other (non HMO)

## 2022-10-29 ENCOUNTER — Encounter (HOSPITAL_COMMUNITY): Payer: Self-pay

## 2022-11-02 ENCOUNTER — Telehealth: Payer: Self-pay

## 2022-11-02 NOTE — Telephone Encounter (Signed)
Received paperwork from [place]

## 2022-11-02 NOTE — Telephone Encounter (Addendum)
Just received Medical paperwork from [place] requesting OV notes from 1/20, 2024 to present, Test/Images results/finding, Treatment plan, restrictions and limitation, and  Est return to work date.  Pt also has an appt tomorrow for CPE.   Paperwork has been given to [place].

## 2022-11-02 NOTE — Progress Notes (Signed)
Complete physical exam   Patient: Kimberly Armstrong   DOB: 06-28-1981   42 y.o. Female  MRN: [id number] Visit Date: 11/03/2022    Chief Complaint  Patient presents with   Annual Exam   Subjective    Kimberly Armstrong is a 42 y.o. female who presents today for a complete physical exam.  She reports consuming a  generally healthy  diet.  Limited ability to exercise due to severe pelvic pain.   She generally feels fairly well. She does have additional problems to discuss today.   HPI  Annual physical  -recently had well-woman exam with pap smear per GYN - normal  -plan is to have MRI of the pelvis with and without contrast to further evaluate cervix and severe endometriosis.  -continues to have pelvic pain  -is now on norethindrone. Doing better with this. Did have negative side effects with Pollie Friar.  -has had request for further information from short term disability  -the patient would like for me to send the requested notes from myself and from GYN and ER providers she has seen since her last visit with me.  -she will likely to have surgery to remove the cervix along with removal of endometrial tissue in the pelvis. Surgery would need to be open laparotomy rather than laparoscopic due to significant scar tissue in the abdomen and pelvis.  -MRI of the pelvis is currently scheduled for 11/08/2022.  -plan for surgery and recovery will be made at that time.  -will extend requested time out of work for 3 months to allow for MRI, surgery, and recovery. Additional information may need to be provided by surgeon/GYN provider.  -the patient is fasting today. Due to have routine labs checked.  -She denies chest pain, chest pressure, or shortness of breath. She denies headaches or visual disturbances. She denies abdominal pain, nausea, vomiting, or changes in bowel or bladder habits.    Past Medical History:  Diagnosis Date   Asthma    TID use currently 2.2024   Endometriosis     Tobacco abuse    Past Surgical History:  Procedure Laterality Date   BACK SURGERY     carpel tunnel     CESAREAN SECTION     x3   ENDOMETRIAL ABLATION     EYE SURGERY N/A    [facility] 11/30/2020   OTHER SURGICAL HISTORY  2013   removal of scar tissue (endometriosis) from Pfannenstiel incision   PARTIAL HYSTERECTOMY  2017   cervix, ovaries left in situ   SPINE SURGERY N/A    [facility] 11/30/2020   TUBAL LIGATION N/A    [facility] 11/30/2020, at time of last section   Social History   Socioeconomic History   Marital status: Married    Spouse name: Kimberly Armstrong   Number of children: 3   Years of education: Associates degree   Highest education level: Associate degree: occupational, Scientist, product/process development, or vocational program  Occupational History   Occupation: Clinical biochemist Rep    Comment: [employer]  Tobacco Use   Smoking status: Every Day    Packs/day: 0.50    Years: 25.00    Additional pack years: 0.00    Total pack years: 12.50    Types: Cigarettes    Start date: 12/03/2000   Smokeless tobacco: Never  Vaping Use   Vaping Use: Never used  Substance and Sexual Activity   Alcohol use: Yes    Alcohol/week: 2.0 standard drinks of alcohol    Types: 2 Glasses of  wine per week    Comment: occasionally   Drug use: Never   Sexual activity: Yes    Partners: Male    Birth control/protection: Surgical    Comment: supracervical hysterectomy, menarche 42yo, sexual debut 42yo  Other Topics Concern   Not on file  Social History Narrative   Not on file   Social Determinants of Health   Financial Resource Strain: Not on file  Food Insecurity: Not on file  Transportation Needs: Not on file  Physical Activity: Not on file  Stress: Not on file  Social Connections: Not on file  Intimate Partner Violence: Not on file   Family Status  Relation Name Status   Mother  Alive   Father  Alive   Sister  (Not Specified)   MGM  Deceased   MGF  Deceased   PGM  Deceased   PGF  Deceased   Neg  Hx  (Not Specified)   Family History  Problem Relation Age of Onset   Breast cancer Mother    Heart attack Mother    Hypertension Mother    Diabetes Sister    Breast cancer Maternal Grandmother    Dementia Paternal Grandmother    Colon cancer Neg Hx    Esophageal cancer Neg Hx    Stomach cancer Neg Hx    Colon polyps Neg Hx    Pancreatic cancer Neg Hx    Allergies  Allergen Reactions   Sulfa Antibiotics Rash    Hives    Patient Care Team: Carlean Jews, NP as PCP - General (Family Medicine) Sheffield, Judye Bos, PA-C as Physician Assistant (Dermatology) Tanda Rockers, NP as Nurse Practitioner (Radiology)   Medications: Outpatient Medications Prior to Visit  Medication Sig   albuterol (VENTOLIN HFA) 108 (90 Base) MCG/ACT inhaler TAKE 2 PUFFS BY MOUTH EVERY 6 HOURS AS NEEDED FOR WHEEZE OR SHORTNESS OF BREATH   B Complex Vitamins (B COMPLEX PO) Take by mouth. drops   Bromelains (BROMELAIN PO) Take by mouth. drops   budesonide-formoterol (SYMBICORT) 160-4.5 MCG/ACT inhaler Inhale 2 puffs into the lungs 2 (two) times daily.   cyclobenzaprine (FLEXERIL) 10 MG tablet TAKE 1/2 TO 1 TABLET BY MOUTH DAILY AT BEDTIME AS NEEDED FOR MUSCLE PAIN   diclofenac (VOLTAREN) 75 MG EC tablet TAKE 1 TABLET BY MOUTH TWICE A DAY   fluticasone (FLONASE) 50 MCG/ACT nasal spray SPRAY 2 SPRAYS INTO EACH NOSTRIL EVERY DAY   ibuprofen (ADVIL) 800 MG tablet Take 1 tablet (800 mg total) by mouth every 8 (eight) hours as needed.   Multiple Vitamin (MULTIVITAMIN PO) Take by mouth.   norethindrone (ORTHO MICRONOR) 0.35 MG tablet Take 1 tablet (0.35 mg total) by mouth daily.   UNABLE TO FIND Med Name: pre/probiotic   [DISCONTINUED] cetirizine (ZYRTEC) 10 MG tablet Take 1 tablet (10 mg total) by mouth daily.   [DISCONTINUED] montelukast (SINGULAIR) 10 MG tablet Take 1 tablet (10 mg total) by mouth at bedtime.   [DISCONTINUED] senna-docusate (SENOKOT-S) 8.6-50 MG tablet Take 1 tablet by mouth at bedtime  as needed for mild constipation or moderate constipation.   [DISCONTINUED] hydrOXYzine (ATARAX) 10 MG tablet Take 1 tablet (10 mg total) by mouth 3 (three) times daily as needed. For itching and rash. (Patient not taking: Reported on 10/15/2022)   [DISCONTINUED] phenazopyridine (PYRIDIUM) 200 MG tablet Take 1 tablet (200 mg total) by mouth 3 (three) times daily as needed for pain. (Patient not taking: Reported on 10/15/2022)   [DISCONTINUED] triamcinolone ointment (KENALOG) 0.5 %  APPLY SMALL AMOUNT TO AFFECTED AREAS TWICE DAILY FOR ITCHING (Patient not taking: Reported on 10/18/2022)   No facility-administered medications prior to visit.    Review of Systems See HPI      Objective     Today's Vitals   11/03/22 0910  BP: (Abnormal) 135/95  Pulse: 81  SpO2: 98%  Weight: 239 lb 1.9 oz (108.5 kg)  Height:  (1.753 m)   Body mass index is 35.31 kg/m.  BP Readings from Last 3 Encounters:  11/29/22 (Abnormal) 137/90  11/03/22 (Abnormal) 135/95  10/21/22 (Abnormal) 156/90    Wt Readings from Last 3 Encounters:  11/29/22 241 lb 1.9 oz (109.4 kg)  11/03/22 239 lb 1.9 oz (108.5 kg)  10/21/22 241 lb 6.4 oz (109.5 kg)     Physical Exam Vitals and nursing note reviewed.  Constitutional:      Appearance: Normal appearance. She is well-developed.  HENT:     Head: Normocephalic and atraumatic.     Right Ear: Tympanic membrane, ear canal and external ear normal.     Left Ear: Tympanic membrane, ear canal and external ear normal.     Nose: Nose normal.     Mouth/Throat:     Mouth: Mucous membranes are moist.     Pharynx: Oropharynx is clear.  Eyes:     Extraocular Movements: Extraocular movements intact.     Conjunctiva/sclera: Conjunctivae normal.     Pupils: Pupils are equal, round, and reactive to light.  Cardiovascular:     Rate and Rhythm: Normal rate and regular rhythm.     Pulses: Normal pulses.     Heart sounds: Normal heart sounds.  Pulmonary:     Effort: Pulmonary  effort is normal.     Breath sounds: Normal breath sounds.  Abdominal:     General: Bowel sounds are normal. There is no distension.     Palpations: Abdomen is soft. There is no mass.     Tenderness: There is abdominal tenderness. There is no right CVA tenderness, left CVA tenderness, guarding or rebound.     Hernia: No hernia is present.     Comments: Mild, generalized abdominal tenderness    Musculoskeletal:        General: Normal range of motion.     Cervical back: Normal range of motion and neck supple.  Lymphadenopathy:     Cervical: No cervical adenopathy.  Skin:    General: Skin is warm and dry.     Capillary Refill: Capillary refill takes less than 2 seconds.  Neurological:     General: No focal deficit present.     Mental Status: She is alert and oriented to person, place, and time.  Psychiatric:        Mood and Affect: Mood normal.        Behavior: Behavior normal.        Thought Content: Thought content normal.        Judgment: Judgment normal.     Last depression screening scores   Row Labels 08/03/2022    3:22 PM 06/29/2022    2:32 PM 06/01/2022    8:20 AM  PHQ 2/9 Scores   Section Header. No data exists in this row.     PHQ - 2 Score   0 0 0  PHQ- 9 Score   0 0 0   Last fall risk screening   Row Labels 10/15/2022   12:08 PM  Fall Risk    Section Header. No data exists in  this row.   Falls in the past year?   0  Number falls in past yr:   0  Injury with Fall?   0  Risk for fall due to :   No Fall Risks  Follow up   Falls evaluation completed     Assessment & Plan     1. Encounter for general adult medical examination with abnormal findings Annual physical today   2. Other fatigue Check thyroid panel for further evaluation.  - TSH + free T4; Future  3. Impaired fasting glucose Check HgbA1c with routine labs today  - Hemoglobin A1c; Future - Lipid panel; Future  4. Vitamin D deficiency Check vitamin d level and treat deficiency as indicated.    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 Scheduled for MRI on 11/08/2022. Surgery and recovery will be planned based on results of MRI. For now, will continue her leave for next 3 months which will allow for diagnostics, surgery, and recoery.   6. Mild intermittent asthma without complication Generally stable. Continue inhalers as needed and as prescribed   7. Healthcare maintenance Routine, fasting labs drawn during today's visit.  - Hemoglobin A1c; Future - Lipid panel; Future - Comprehensive metabolic panel; Future - CBC; Future   Immunization History  Administered Date(s) Administered   Influenza,inj,Quad PF,6+ Mos 09/07/2021, 08/03/2022   Tdap 03/05/2021    Health Maintenance  Topic Date Due   COVID-19 Vaccine (1) Never done   HIV Screening  Never done   Hepatitis C Screening  Never done   INFLUENZA VACCINE  03/31/2023   PAP SMEAR-Modifier  10/15/2025   DTaP/Tdap/Td (2 - Td or Tdap) 03/06/2031   HPV VACCINES  Aged Out    Discussed health benefits of physical activity, and encouraged her to engage in regular exercise appropriate for her age and condition.  Problem List Items Addressed This Visit       Respiratory   Mild intermittent asthma without complication     Endocrine   Impaired fasting glucose   Relevant Orders   Hemoglobin A1c (Completed)   Lipid panel (Completed)     Other   Neoplasm of uncertain behavior of pelvis   Other fatigue   Relevant Orders   TSH + free T4 (Completed)   Vitamin D deficiency   Relevant Orders   VITAMIN D 25 Hydroxy (Vit-D Deficiency, Fractures) (Completed)   Other Visit Diagnoses     Encounter for general adult medical examination with abnormal findings    -  Primary   Healthcare maintenance       Relevant Orders   Hemoglobin A1c (Completed)   Lipid panel (Completed)   Comprehensive metabolic panel (Completed)   CBC  (Completed)        Return in about 3 months (around 02/03/2023) for asthma - ?return to work .        Carlean Jews, NP  [facility] at [facility] [phone] (phone) [phone] (fax)  [facility]

## 2022-11-03 ENCOUNTER — Ambulatory Visit: Payer: Managed Care, Other (non HMO) | Admitting: Nurse Practitioner

## 2022-11-03 ENCOUNTER — Encounter: Payer: Self-pay | Admitting: Nurse Practitioner

## 2022-11-03 VITALS — BP 135/95 | HR 81 | Ht 69.0 in | Wt 239.1 lb

## 2022-11-03 DIAGNOSIS — Z0001 Encounter for general adult medical examination with abnormal findings: Secondary | ICD-10-CM

## 2022-11-03 DIAGNOSIS — R7301 Impaired fasting glucose: Secondary | ICD-10-CM | POA: Diagnosis not present

## 2022-11-03 DIAGNOSIS — E559 Vitamin D deficiency, unspecified: Secondary | ICD-10-CM | POA: Diagnosis not present

## 2022-11-03 DIAGNOSIS — R5383 Other fatigue: Secondary | ICD-10-CM | POA: Diagnosis not present

## 2022-11-03 DIAGNOSIS — Z0000 Encounter for general adult medical examination without abnormal findings: Secondary | ICD-10-CM

## 2022-11-03 DIAGNOSIS — D487 Neoplasm of uncertain behavior of other specified sites: Secondary | ICD-10-CM

## 2022-11-03 DIAGNOSIS — J4520 Mild intermittent asthma, uncomplicated: Secondary | ICD-10-CM

## 2022-11-04 LAB — CBC
Hematocrit: 47.4 % — ABNORMAL HIGH (ref 34.0–46.6)
Hemoglobin: 16.3 g/dL — ABNORMAL HIGH (ref 11.1–15.9)
MCH: 30.3 pg (ref 26.6–33.0)
MCHC: 34.4 g/dL (ref 31.5–35.7)
MCV: 88 fL (ref 79–97)
Platelets: 219 10*3/uL (ref 150–450)
RBC: 5.38 x10E6/uL — ABNORMAL HIGH (ref 3.77–5.28)
RDW: 13.4 % (ref 11.7–15.4)
WBC: 7.1 10*3/uL (ref 3.4–10.8)

## 2022-11-04 LAB — COMPREHENSIVE METABOLIC PANEL
ALT: 25 IU/L (ref 0–32)
AST: 15 IU/L (ref 0–40)
Albumin/Globulin Ratio: 1.7 (ref 1.2–2.2)
Albumin: 4.3 g/dL (ref 3.9–4.9)
Alkaline Phosphatase: 83 IU/L (ref 44–121)
BUN/Creatinine Ratio: 8 — ABNORMAL LOW (ref 9–23)
BUN: 7 mg/dL (ref 6–24)
Bilirubin Total: 0.4 mg/dL (ref 0.0–1.2)
CO2: 21 mmol/L (ref 20–29)
Calcium: 9.4 mg/dL (ref 8.7–10.2)
Chloride: 108 mmol/L — ABNORMAL HIGH (ref 96–106)
Creatinine, Ser: 0.84 mg/dL (ref 0.57–1.00)
Globulin, Total: 2.6 g/dL (ref 1.5–4.5)
Glucose: 104 mg/dL — ABNORMAL HIGH (ref 70–99)
Potassium: 4.8 mmol/L (ref 3.5–5.2)
Sodium: 142 mmol/L (ref 134–144)
Total Protein: 6.9 g/dL (ref 6.0–8.5)
eGFR: 88 mL/min/{1.73_m2} (ref >59)

## 2022-11-04 LAB — LIPID PANEL
Chol/HDL Ratio: 5.4 ratio — ABNORMAL HIGH (ref 0.0–4.4)
Cholesterol, Total: 151 mg/dL (ref 100–199)
HDL: 28 mg/dL — ABNORMAL LOW (ref >39)
LDL Chol Calc (NIH): 71 mg/dL (ref 0–99)
Triglycerides: 323 mg/dL — ABNORMAL HIGH (ref 0–149)
VLDL Cholesterol Cal: 52 mg/dL — ABNORMAL HIGH (ref 5–40)

## 2022-11-04 LAB — HEMOGLOBIN A1C
Est. average glucose Bld gHb Est-mCnc: 123 mg/dL
Hgb A1c MFr Bld: 5.9 % — ABNORMAL HIGH (ref 4.8–5.6)

## 2022-11-04 LAB — TSH+FREE T4
Free T4: 1.24 ng/dL (ref 0.82–1.77)
TSH: 1.6 u[IU]/mL (ref 0.450–4.500)

## 2022-11-04 LAB — VITAMIN D 25 HYDROXY (VIT D DEFICIENCY, FRACTURES): Vit D, 25-Hydroxy: 21.9 ng/mL — ABNORMAL LOW (ref 30.0–100.0)

## 2022-11-04 NOTE — Telephone Encounter (Signed)
PCP is advised of pt forms PCP has the pw, pt was in office for CPE

## 2022-11-05 ENCOUNTER — Encounter: Payer: Self-pay | Admitting: Nurse Practitioner

## 2022-11-09 ENCOUNTER — Telehealth: Payer: Self-pay | Admitting: *Deleted

## 2022-11-09 NOTE — Telephone Encounter (Signed)
Patient called and stated "I wanted to let Dr Berline Lopes know that the MRI machine is down at [facility] and my appt was moved to 3/14."

## 2022-11-12 ENCOUNTER — Inpatient Hospital Stay
Admission: RE | Admit: 2022-11-12 | Discharge: 2022-11-12 | Disposition: A | Discharge status: Home / Self Care | Payer: Self-pay | Source: Ambulatory Visit | Attending: Gynecologic Oncology | Admitting: Gynecologic Oncology

## 2022-11-12 ENCOUNTER — Telehealth: Payer: Self-pay | Admitting: Gynecologic Oncology

## 2022-11-12 ENCOUNTER — Other Ambulatory Visit: Payer: Self-pay

## 2022-11-12 DIAGNOSIS — N888 Other specified noninflammatory disorders of cervix uteri: Secondary | ICD-10-CM

## 2022-11-12 NOTE — Telephone Encounter (Signed)
Spoke with the patient.  Let her know that I received a copy of the MRI report done outside our system.  My office is working on getting the images pushed through so that I can compare them to her MRI last year.  She is aware that I will call once I am able to review both sets of images.  Jeral Pinch MD Gynecologic Oncology

## 2022-11-17 ENCOUNTER — Encounter: Payer: Self-pay | Admitting: Gynecologic Oncology

## 2022-11-18 ENCOUNTER — Encounter: Payer: Self-pay | Admitting: Nurse Practitioner

## 2022-11-19 ENCOUNTER — Telehealth: Payer: Self-pay

## 2022-11-19 NOTE — Telephone Encounter (Signed)
Refill request received from [facility].  Faxed/confirmed refill per Joylene John NP

## 2022-11-23 ENCOUNTER — Telehealth: Payer: Self-pay | Admitting: Gynecologic Oncology

## 2022-11-23 NOTE — Telephone Encounter (Signed)
Spoke with the patient about recent MRI. We were able to get images pushed over and I reviewed these with radiologist who read her MRI in 08/2021. Overall, size and appearance of cervix is stable and continues to support a benign process (possible endometriosis, possible nabothian cysts). Ovaries are normal in appearance. There are no findings of metastatic disease.  Discussed options with the patient including supportive care, medical management (hasn't noticed much change in symptoms on oral progesterone), medical ovarian suppression (with Lupon or Orlissa again), or surgery. The patient would be interested in surgery to remove the cervix and leave the ovaries if she were to pursue that treatment.   All questions answered. The patient would like some times to consider options and will message or call my office when ready.  Jeral Pinch MD Gynecologic Oncology

## 2022-11-24 ENCOUNTER — Telehealth: Payer: Self-pay

## 2022-11-24 NOTE — Telephone Encounter (Signed)
Pt just came by the office and dropped off paperwork for completion.   Pt was made aware of the 29$ fee that is required for paperwork. Pt wants to be informed when its complete and she will come pay the 29$ fee to complete the process.   Paperwork was given to [place]

## 2022-11-24 NOTE — Progress Notes (Signed)
Vitamin d deficiency and high triglycerides. Discuss with patient at visit 4/1

## 2022-11-24 NOTE — Telephone Encounter (Signed)
Received paper work this will be placed in PCP basket 11/24/2022

## 2022-11-29 ENCOUNTER — Ambulatory Visit: Payer: Managed Care, Other (non HMO) | Admitting: Nurse Practitioner

## 2022-11-29 ENCOUNTER — Encounter: Payer: Self-pay | Admitting: Nurse Practitioner

## 2022-11-29 VITALS — BP 137/90 | HR 73 | Ht 69.0 in | Wt 241.1 lb

## 2022-11-29 DIAGNOSIS — M25561 Pain in right knee: Secondary | ICD-10-CM

## 2022-11-29 DIAGNOSIS — K5900 Constipation, unspecified: Secondary | ICD-10-CM | POA: Diagnosis not present

## 2022-11-29 MED ORDER — SENNOSIDES-DOCUSATE SODIUM 8.6-50 MG PO TABS: 1.0000 | ORAL_TABLET | Freq: Every evening | ORAL | 5 refills | Status: DC | PRN

## 2022-11-29 NOTE — Progress Notes (Signed)
Established patient visit   Patient: Kimberly Armstrong   DOB: [birth year]   42 y.o. Female  MRN: [id number] Visit Date: 11/29/2022  Chief Complaint  Patient presents with   CALF   Subjective    Leg Pain  The incident occurred more than 1 week ago. The incident occurred at home. The injury mechanism is unknown. The pain is present in the right knee, left foot and right foot. The quality of the pain is described as shooting, aching and burning. The pain is at a severity of 7/10. The pain has been Constant (pain is constant. intermittently, pain gets more severe) since onset. Associated symptoms include an inability to bear weight and a loss of motion. The symptoms are aggravated by movement and weight bearing. She has tried immobilization, acetaminophen, NSAIDs, non-weight bearing, elevation and ice for the symptoms. Improvement on treatment: minimal.      Medications: Outpatient Medications Prior to Visit  Medication Sig   albuterol (VENTOLIN HFA) 108 (90 Base) MCG/ACT inhaler TAKE 2 PUFFS BY MOUTH EVERY 6 HOURS AS NEEDED FOR WHEEZE OR SHORTNESS OF BREATH   B Complex Vitamins (B COMPLEX PO) Take by mouth. drops   Bromelains (BROMELAIN PO) Take by mouth. drops   budesonide-formoterol (SYMBICORT) 160-4.5 MCG/ACT inhaler Inhale 2 puffs into the lungs 2 (two) times daily.   cyclobenzaprine (FLEXERIL) 10 MG tablet TAKE 1/2 TO 1 TABLET BY MOUTH DAILY AT BEDTIME AS NEEDED FOR MUSCLE PAIN   diclofenac (VOLTAREN) 75 MG EC tablet TAKE 1 TABLET BY MOUTH TWICE A DAY   fluticasone (FLONASE) 50 MCG/ACT nasal spray SPRAY 2 SPRAYS INTO EACH NOSTRIL EVERY DAY   ibuprofen (ADVIL) 800 MG tablet Take 1 tablet (800 mg total) by mouth every 8 (eight) hours as needed.   Multiple Vitamin (MULTIVITAMIN PO) Take by mouth.   norethindrone (ORTHO MICRONOR) 0.35 MG tablet Take 1 tablet (0.35 mg total) by mouth daily.   UNABLE TO FIND Med Name: pre/probiotic   [DISCONTINUED] cetirizine (ZYRTEC) 10 MG tablet  Take 1 tablet (10 mg total) by mouth daily.   [DISCONTINUED] montelukast (SINGULAIR) 10 MG tablet Take 1 tablet (10 mg total) by mouth at bedtime.   [DISCONTINUED] senna-docusate (SENOKOT-S) 8.6-50 MG tablet Take 1 tablet by mouth at bedtime as needed for mild constipation or moderate constipation.   [DISCONTINUED] hydrOXYzine (ATARAX) 10 MG tablet Take 1 tablet (10 mg total) by mouth 3 (three) times daily as needed. For itching and rash. (Patient not taking: Reported on 10/15/2022)   [DISCONTINUED] triamcinolone ointment (KENALOG) 0.5 % APPLY SMALL AMOUNT TO AFFECTED AREAS TWICE DAILY FOR ITCHING (Patient not taking: Reported on 10/18/2022)   No facility-administered medications prior to visit.    Review of Systems See HPI      Objective     Today's Vitals   11/29/22 1518 11/29/22 1526  BP: (Abnormal) 143/74 (Abnormal) 137/90  Pulse: 73   SpO2: 100%   Weight: 241 lb 1.9 oz (109.4 kg)   Height: 5\' 9"  (1.753 m)    Body mass index is 35.61 kg/m.   Physical Exam Vitals and nursing note reviewed.  Constitutional:      Appearance: Normal appearance. She is well-developed.  HENT:     Head: Normocephalic and atraumatic.     Nose: Nose normal.     Mouth/Throat:     Mouth: Mucous membranes are moist.     Pharynx: Oropharynx is clear.  Eyes:     Extraocular Movements: Extraocular movements intact.     Conjunctiva/sclera:  Conjunctivae normal.     Pupils: Pupils are equal, round, and reactive to light.  Neck:     Vascular: No carotid bruit.  Cardiovascular:     Rate and Rhythm: Normal rate and regular rhythm.     Pulses: Normal pulses.     Heart sounds: Normal heart sounds.  Pulmonary:     Effort: Pulmonary effort is normal.     Breath sounds: Normal breath sounds.  Abdominal:     Palpations: Abdomen is soft.  Musculoskeletal:        General: Normal range of motion.     Cervical back: Normal range of motion and neck supple.       Legs:  Lymphadenopathy:     Cervical: No  cervical adenopathy.  Skin:    General: Skin is warm and dry.     Capillary Refill: Capillary refill takes less than 2 seconds.  Neurological:     General: No focal deficit present.     Mental Status: She is alert and oriented to person, place, and time.  Psychiatric:        Mood and Affect: Mood normal.        Behavior: Behavior normal.        Thought Content: Thought content normal.        Judgment: Judgment normal.      Assessment & Plan     Acute pain of right knee Assessment & Plan: Apply a compressive ACE bandage. Rest and elevate right knee.  Apply cold compresses intermittently as needed.   Recommend she take tylenol/ibuprofen for pain/inflammation  Get x-ray of right knee for further evaluation.   Orders: -     DG Knee 1-2 Views Right  Constipation, unspecified constipation type Assessment & Plan: Continue to take Senno-Kot as needed and as indicated for constipation.  Ensure proper fiber and water in the diet.  Orders: -     Sennosides-Docusate Sodium; Take 1 tablet by mouth at bedtime as needed for mild constipation or moderate constipation.  Dispense: 30 tablet; Refill: 5     Return for as scheduled.        Carlean Jews, NP  [facility] at [facility] [phone] (phone) [phone] (fax)  [facility]

## 2022-11-30 ENCOUNTER — Ambulatory Visit
Admission: RE | Admit: 2022-11-30 | Discharge: 2022-11-30 | Disposition: A | Discharge status: Home / Self Care | Payer: Managed Care, Other (non HMO) | Source: Ambulatory Visit | Attending: Nurse Practitioner | Admitting: Nurse Practitioner

## 2022-11-30 ENCOUNTER — Telehealth: Payer: Self-pay | Admitting: *Deleted

## 2022-11-30 NOTE — Telephone Encounter (Signed)
Request received from [facility] for LTD requesting records from 05/21/2022 to present.  Spoke with patient. Advised forms received, GYN ONC would need to complete forms for LTD. Patient requested records to be sent to [place] as requested. Advised I will forward forms to business office to f/u and complete. Patient agreeable.   Forms to [place].

## 2022-12-01 NOTE — Telephone Encounter (Signed)
[place] informing them we could not fill out form it needs to be sent to [facility]. Request new release form for records to be sent to them.

## 2022-12-02 ENCOUNTER — Encounter: Payer: Self-pay | Admitting: Nurse Practitioner

## 2022-12-02 ENCOUNTER — Other Ambulatory Visit: Payer: Self-pay | Admitting: Nurse Practitioner

## 2022-12-02 DIAGNOSIS — M25561 Pain in right knee: Secondary | ICD-10-CM

## 2022-12-02 DIAGNOSIS — R936 Abnormal findings on diagnostic imaging of limbs: Secondary | ICD-10-CM

## 2022-12-02 DIAGNOSIS — J302 Other seasonal allergic rhinitis: Secondary | ICD-10-CM

## 2022-12-06 ENCOUNTER — Other Ambulatory Visit: Payer: Self-pay | Admitting: Nurse Practitioner

## 2022-12-06 DIAGNOSIS — M25461 Effusion, right knee: Secondary | ICD-10-CM

## 2022-12-06 DIAGNOSIS — M25561 Pain in right knee: Secondary | ICD-10-CM

## 2022-12-06 NOTE — Telephone Encounter (Signed)
Paperwork is complete and I gave it to [place] this morning.

## 2022-12-06 NOTE — Telephone Encounter (Signed)
Pt came by to pick up paperwork and it was faxed along with collection for paperwork fee.

## 2022-12-06 NOTE — Progress Notes (Signed)
Small effusion and small patellar spur. Refer to orthopedics if she wants to go.

## 2022-12-06 NOTE — Telephone Encounter (Signed)
Ok thanks, Kimberly Armstrong called pt she is informed of her paperwork that is ready for pick up and also the $29 fee

## 2022-12-15 ENCOUNTER — Telehealth: Payer: Self-pay

## 2022-12-15 NOTE — Telephone Encounter (Signed)
Kimberly Armstrong is calling office stating she spoke to Dr.Tucker on 11/23/22 to discuss further options since having MRI. She states she has decided with surgery and would like a call back with details on next steps.  Pt aware Dr. Pricilla Holm is in the OR today but I would forward the message and our office will call with details. She voiced an understanding.

## 2022-12-15 NOTE — Telephone Encounter (Signed)
Patient was interested in trachelectomy as of the last time we spoke (leaving the ovaries). Please reach out to her once Efraim Kaufmann has reviewed the surgery schedule and can give [place] some dates. She'll need to come in for a pre-op with both of [place].

## 2022-12-15 NOTE — Telephone Encounter (Signed)
Long term claim forms received from [employer] benefit. Pt has not no scheduled surgery with Dr. Pricilla Holm, only last note faxed to claims department. Fax# [phone] Rivka Barbara)

## 2022-12-21 ENCOUNTER — Telehealth: Payer: Self-pay

## 2022-12-21 NOTE — Telephone Encounter (Signed)
PA received from covermymeds for Orilissa  tablets.  Jami sent patient to Dr Seymour Bars for reevaluation and patient has been referred to [facility].  PA not completed.

## 2022-12-21 NOTE — Telephone Encounter (Signed)
Per [name] NP, pt is aware of potential surgery dates of July 3, July 17th, July 18th Pt has chosen July 18th.  Follow up with Dr.Tucker scheduled for 03/10/23 @ 1:00pm and Pre-op with [name] NP on 03/10/23 @ 1:30pm

## 2022-12-24 ENCOUNTER — Encounter: Payer: Self-pay | Admitting: Gynecologic Oncology

## 2022-12-24 ENCOUNTER — Telehealth: Payer: Self-pay | Admitting: *Deleted

## 2022-12-24 NOTE — Telephone Encounter (Signed)
Fax forms and letter to [employer]

## 2022-12-28 ENCOUNTER — Other Ambulatory Visit: Payer: Self-pay | Admitting: Gynecologic Oncology

## 2022-12-28 DIAGNOSIS — N888 Other specified noninflammatory disorders of cervix uteri: Secondary | ICD-10-CM

## 2022-12-30 ENCOUNTER — Encounter: Payer: Self-pay | Admitting: *Deleted

## 2022-12-30 ENCOUNTER — Encounter: Payer: Self-pay | Admitting: Gynecologic Oncology

## 2022-12-30 DIAGNOSIS — M25561 Pain in right knee: Secondary | ICD-10-CM | POA: Insufficient documentation

## 2022-12-30 DIAGNOSIS — K5900 Constipation, unspecified: Secondary | ICD-10-CM | POA: Insufficient documentation

## 2022-12-30 NOTE — Assessment & Plan Note (Signed)
Continue to take Senno-Kot as needed and as indicated for constipation.  Ensure proper fiber and water in the diet.

## 2022-12-30 NOTE — Assessment & Plan Note (Signed)
Apply a compressive ACE bandage. Rest and elevate right knee.  Apply cold compresses intermittently as needed.   Recommend she take tylenol/ibuprofen for pain/inflammation  Get x-ray of right knee for further evaluation.

## 2023-01-17 NOTE — Telephone Encounter (Signed)
Kimberly Armstrong -can this be closed?

## 2023-01-17 NOTE — Telephone Encounter (Signed)
Kimberly Armstrong  You3 minutes ago (4:00 PM)   CS Yes it has been done.    Encounter closed.

## 2023-01-18 ENCOUNTER — Encounter: Payer: Self-pay | Admitting: Gynecologic Oncology

## 2023-01-18 ENCOUNTER — Telehealth: Payer: Self-pay | Admitting: *Deleted

## 2023-01-18 NOTE — Telephone Encounter (Signed)
Ms. Candelario was called after she left a MyChart message.  She states, "Once I started on the birth control pill about 3 months ago I was having pain only a few times a month"  Now, I am having pain every 3rd. Day. Pt describes the pain as sharp, stabbing and cramping and feels the pain more when she urinates and has a bowel movement.  Pt denies any burning on urination, states the pain is higher inside and there is a feeling of "pressure". Denies flank pain, fever and chills.   Patient states she hasn't had any dietary changes that she is aware of and "it's hard to narrow down what makes it worse" Denies a decrease in her appetite.  States the pain starts off dull then increases in intensity to the above description.  Pt denies any vaginal bleeding or discharge. She is having regular bowel movements, denies bloating or abdominal distention.   Pt states she is still doing her pelvic floor exercises. She is taking Flexeril prescribed by her PCP only when she absolutely needs it and right now states that is the only thing that has helped with the pain that she scores a 10/10 at times.  Patient's surgery date is scheduled for July 18 th. Pt advised that her message will be forwarded to Dr. Pricilla Holm. And to call the other with any increased symptoms, concerns or questions.

## 2023-01-20 NOTE — Telephone Encounter (Signed)
Happy to order an ultrasound to make sure there has not been any significant change in her cervix or pelvis. Please let me know if she's interested in that.

## 2023-01-21 ENCOUNTER — Other Ambulatory Visit: Payer: Self-pay | Admitting: Gynecologic Oncology

## 2023-01-21 DIAGNOSIS — N809 Endometriosis, unspecified: Secondary | ICD-10-CM

## 2023-01-21 DIAGNOSIS — R102 Pelvic and perineal pain: Secondary | ICD-10-CM

## 2023-01-21 DIAGNOSIS — N888 Other specified noninflammatory disorders of cervix uteri: Secondary | ICD-10-CM

## 2023-01-21 NOTE — Telephone Encounter (Signed)
My last message was that the order was placed

## 2023-01-21 NOTE — Telephone Encounter (Signed)
Notified [name] of her scheduled pelvic ultrasound at [facility] on June 7th at 1000. Pt was instructed to come with a full bladder. Pt agreed to Date and time and receptive to instructions. Pt has no further concerns or questions at this time.

## 2023-01-21 NOTE — Telephone Encounter (Signed)
Spoke with Kimberly Armstrong and relayed message from Dr. Eugene Garnet. Pt would like to have an ultrasound, since her discomfort is not getting any better. Advised patient that the office would call her once we have an order for the ultrasound.

## 2023-01-21 NOTE — Telephone Encounter (Signed)
Order is in. Thank you.

## 2023-01-25 ENCOUNTER — Telehealth: Payer: Self-pay | Admitting: *Deleted

## 2023-01-25 NOTE — Telephone Encounter (Signed)
Patient was notified and relayed message from [name], NP that Patient's surgery on July 18 th has been moved to July 17th. Due to OR staffing. Patient agreed to date and no further concerns or questions at this time.

## 2023-02-03 ENCOUNTER — Encounter: Payer: Self-pay | Admitting: Nurse Practitioner

## 2023-02-03 ENCOUNTER — Ambulatory Visit (INDEPENDENT_AMBULATORY_CARE_PROVIDER_SITE_OTHER): Payer: Medicaid Other | Admitting: Nurse Practitioner

## 2023-02-03 VITALS — BP 156/85 | HR 75 | Ht 69.0 in | Wt 238.1 lb

## 2023-02-03 DIAGNOSIS — R030 Elevated blood-pressure reading, without diagnosis of hypertension: Secondary | ICD-10-CM

## 2023-02-03 DIAGNOSIS — R102 Pelvic and perineal pain: Secondary | ICD-10-CM

## 2023-02-03 DIAGNOSIS — J4520 Mild intermittent asthma, uncomplicated: Secondary | ICD-10-CM

## 2023-02-03 DIAGNOSIS — F17209 Nicotine dependence, unspecified, with unspecified nicotine-induced disorders: Secondary | ICD-10-CM

## 2023-02-03 NOTE — Assessment & Plan Note (Signed)
Stable.  Continue current medication

## 2023-02-03 NOTE — Assessment & Plan Note (Signed)
Patient is current and everyday smoker.

## 2023-02-03 NOTE — Progress Notes (Signed)
Established patient visit   Patient: Kimberly Armstrong   DOB: [birth year]   42 y.o. Female  MRN: [id number] Visit Date: 02/03/2023   Chief Complaint  Patient presents with   Medical Management of Chronic Issues   Subjective    HPI  Follow up  -persistent pelvic pain  -scheduled to have surgical removal of cervix 03/10/2023 -patient is seeing GYN surgeon for this -will probably have to go back to work in between now and prior to surgery. Will be out again after surgery depending on recovery.  -hx asthma and allergies  -overall, asthma has been well managed.   -she denies chest pain, chest pressure, or shortness of breath. She denies headaches or visual disturbances. She denies abdominal pain, nausea, vomiting, or changes in bowel or bladder habits.      Medications: Outpatient Medications Prior to Visit  Medication Sig   albuterol (VENTOLIN HFA) 108 (90 Base) MCG/ACT inhaler TAKE 2 PUFFS BY MOUTH EVERY 6 HOURS AS NEEDED FOR WHEEZE OR SHORTNESS OF BREATH   budesonide-formoterol (SYMBICORT) 160-4.5 MCG/ACT inhaler Inhale 2 puffs into the lungs 2 (two) times daily.   cetirizine (ZYRTEC) 10 MG tablet TAKE 1 TABLET BY MOUTH EVERY DAY   cyclobenzaprine (FLEXERIL) 10 MG tablet TAKE 1/2 TO 1 TABLET BY MOUTH DAILY AT BEDTIME AS NEEDED FOR MUSCLE PAIN   diclofenac (VOLTAREN) 75 MG EC tablet TAKE 1 TABLET BY MOUTH TWICE A DAY   fluticasone (FLONASE) 50 MCG/ACT nasal spray SPRAY 2 SPRAYS INTO EACH NOSTRIL EVERY DAY   meloxicam (MOBIC) 15 MG tablet Take by mouth.   montelukast (SINGULAIR) 10 MG tablet TAKE 1 TABLET BY MOUTH EVERYDAY AT BEDTIME   norethindrone (ORTHO MICRONOR) 0.35 MG tablet Take 1 tablet (0.35 mg total) by mouth daily.   senna-docusate (SENOKOT-S) 8.6-50 MG tablet Take 1 tablet by mouth at bedtime as needed for mild constipation or moderate constipation.   [DISCONTINUED] B Complex Vitamins (B COMPLEX PO) Take by mouth. drops   [DISCONTINUED] Bromelains (BROMELAIN PO)  Take by mouth. drops   [DISCONTINUED] ibuprofen (ADVIL) 800 MG tablet Take 1 tablet (800 mg total) by mouth every 8 (eight) hours as needed.   [DISCONTINUED] Multiple Vitamin (MULTIVITAMIN PO) Take by mouth.   [DISCONTINUED] UNABLE TO FIND Med Name: pre/probiotic   No facility-administered medications prior to visit.    Review of Systems See HPI   Last CBC Lab Results  Component Value Date   WBC 7.1 11/03/2022   HGB 16.3 (H) 11/03/2022   HCT 47.4 (H) 11/03/2022   MCV 88 11/03/2022   MCH 30.3 11/03/2022   RDW 13.4 11/03/2022   PLT 219 11/03/2022   Last metabolic panel Lab Results  Component Value Date   GLUCOSE 104 (H) 11/03/2022   NA 142 11/03/2022   K 4.8 11/03/2022   CL 108 (H) 11/03/2022   CO2 21 11/03/2022   BUN 7 11/03/2022   CREATININE 0.84 11/03/2022   EGFR 88 11/03/2022   CALCIUM 9.4 11/03/2022   PROT 6.9 11/03/2022   ALBUMIN 4.3 11/03/2022   LABGLOB 2.6 11/03/2022   AGRATIO 1.7 11/03/2022   BILITOT 0.4 11/03/2022   ALKPHOS 83 11/03/2022   AST 15 11/03/2022   ALT 25 11/03/2022   ANIONGAP 6 03/22/2022   Last lipids Lab Results  Component Value Date   CHOL 151 11/03/2022   HDL 28 (L) 11/03/2022   LDLCALC 71 11/03/2022   TRIG 323 (H) 11/03/2022   CHOLHDL 5.4 (H) 11/03/2022   Last hemoglobin A1c Lab  Results  Component Value Date   HGBA1C 5.9 (H) 11/03/2022   Last thyroid functions Lab Results  Component Value Date   TSH 1.600 11/03/2022   Last vitamin D Lab Results  Component Value Date   VD25OH 21.9 (L) 11/03/2022       Objective     Today's Vitals   02/03/23 1001  BP: (Abnormal) 156/85  Pulse: 75  SpO2: 98%  Weight: 238 lb 1.9 oz (108 kg)  Height: 5\' 9"  (1.753 m)   Body mass index is 35.16 kg/m.  BP Readings from Last 3 Encounters:  02/03/23 (Abnormal) 156/85  11/29/22 (Abnormal) 137/90  11/03/22 (Abnormal) 135/95    Wt Readings from Last 3 Encounters:  02/03/23 238 lb 1.9 oz (108 kg)  11/29/22 241 lb 1.9 oz (109.4 kg)   11/03/22 239 lb 1.9 oz (108.5 kg)    Physical Exam Vitals and nursing note reviewed.  Constitutional:      Appearance: Normal appearance. She is well-developed.  HENT:     Head: Normocephalic and atraumatic.     Nose: Nose normal.     Mouth/Throat:     Mouth: Mucous membranes are moist.     Pharynx: Oropharynx is clear.  Eyes:     Extraocular Movements: Extraocular movements intact.     Conjunctiva/sclera: Conjunctivae normal.     Pupils: Pupils are equal, round, and reactive to light.  Neck:     Vascular: No carotid bruit.  Cardiovascular:     Rate and Rhythm: Normal rate and regular rhythm.     Pulses: Normal pulses.     Heart sounds: Normal heart sounds.  Pulmonary:     Effort: Pulmonary effort is normal.     Breath sounds: Normal breath sounds.  Abdominal:     Palpations: Abdomen is soft.  Musculoskeletal:        General: Normal range of motion.     Cervical back: Normal range of motion and neck supple.  Lymphadenopathy:     Cervical: No cervical adenopathy.  Skin:    General: Skin is warm and dry.     Capillary Refill: Capillary refill takes less than 2 seconds.  Neurological:     General: No focal deficit present.     Mental Status: She is alert and oriented to person, place, and time.  Psychiatric:        Mood and Affect: Mood normal.        Behavior: Behavior normal.        Thought Content: Thought content normal.        Judgment: Judgment normal.     Assessment & Plan    Elevated blood pressure reading without diagnosis of hypertension Assessment & Plan: Blood pressure elevated today, though generally stable.  -advised she limit sodium and increase water in diet.  -avoiding caffeine and nicotine will also improve blood pressure.  -reassess at next visit.    Mild intermittent asthma without complication Assessment & Plan: Stable.  -Continue current medication.    Pelvic pain in female Assessment & Plan: Patient scheduled for surgical removal of  cervix.  -follow up with GYN as scheduled.    Tobacco use disorder, continuous Assessment & Plan: Patient is current and everyday smoker.      Return in about 4 months (around 06/05/2023) for asthma .         Carlean Jews, NP  [facility] at [facility] [phone] (phone) [phone] (fax)  [facility]

## 2023-02-03 NOTE — Assessment & Plan Note (Signed)
Patient scheduled for surgical removal of cervix.  -follow up with GYN as scheduled.

## 2023-02-03 NOTE — Assessment & Plan Note (Signed)
Blood pressure elevated today, though generally stable.  -advised she limit sodium and increase water in diet.  -avoiding caffeine and nicotine will also improve blood pressure.  -reassess at next visit.

## 2023-02-04 ENCOUNTER — Ambulatory Visit (HOSPITAL_COMMUNITY)
Admission: RE | Admit: 2023-02-04 | Discharge: 2023-02-04 | Disposition: A | Discharge status: Home / Self Care | Payer: Medicaid Other | Source: Ambulatory Visit | Attending: Gynecologic Oncology | Admitting: Gynecologic Oncology

## 2023-02-04 DIAGNOSIS — N888 Other specified noninflammatory disorders of cervix uteri: Secondary | ICD-10-CM | POA: Insufficient documentation

## 2023-02-04 DIAGNOSIS — N809 Endometriosis, unspecified: Secondary | ICD-10-CM | POA: Insufficient documentation

## 2023-02-04 DIAGNOSIS — R102 Pelvic and perineal pain: Secondary | ICD-10-CM | POA: Diagnosis present

## 2023-02-08 ENCOUNTER — Other Ambulatory Visit (HOSPITAL_COMMUNITY): Payer: Managed Care, Other (non HMO)

## 2023-02-14 ENCOUNTER — Encounter: Payer: Self-pay | Admitting: Gynecologic Oncology

## 2023-02-14 ENCOUNTER — Encounter: Payer: Self-pay | Admitting: Nurse Practitioner

## 2023-02-15 ENCOUNTER — Other Ambulatory Visit: Payer: Self-pay | Admitting: Nurse Practitioner

## 2023-02-15 ENCOUNTER — Telehealth: Payer: Self-pay | Admitting: *Deleted

## 2023-02-15 ENCOUNTER — Telehealth: Payer: Self-pay | Admitting: Oncology

## 2023-02-15 DIAGNOSIS — R102 Pelvic and perineal pain: Secondary | ICD-10-CM

## 2023-02-15 DIAGNOSIS — J4520 Mild intermittent asthma, uncomplicated: Secondary | ICD-10-CM

## 2023-02-15 NOTE — Telephone Encounter (Signed)
Called Kimberly Armstrong and advised her that her surgery is going to be rescheduled to 03/09/23 from 03/16/23.  She verbalized understanding and agreement on new surgery date.  Also discussed her MyChart message requesting a letter for long term disability and let her know that this would need to come from her primary care physician.  Kimberly Armstrong said she actually needs a letter from Dr. Pricilla Holm and her PCP for an appeal.  She said Dr. Pricilla Holm had sent in a letter previously.  Advised I will check and call her back.

## 2023-02-15 NOTE — Telephone Encounter (Signed)
Fax records request to [facility] for office notes from 10/29/2022 to now, no records since 10/21/2022. Fax [place] report

## 2023-02-16 ENCOUNTER — Encounter: Payer: Self-pay | Admitting: Gynecologic Oncology

## 2023-02-16 NOTE — Telephone Encounter (Signed)
Called Kimberly Armstrong back and advised her that we can only write a letter with hr surgery date and post-op restrictions for 6 weeks after.  Hagan said that would help and is wondering if we could email it to her once it is completed.  Her email is [email or web address].    Letter emailed successfully to patient.

## 2023-02-17 ENCOUNTER — Encounter: Payer: Self-pay | Admitting: Oncology

## 2023-02-21 ENCOUNTER — Telehealth: Payer: Self-pay | Admitting: *Deleted

## 2023-02-21 NOTE — Telephone Encounter (Signed)
Pt LVM about a letter that she is requesting. Looks like she sent provider a message on 02/14/23. PLease advise.

## 2023-02-26 ENCOUNTER — Telehealth: Payer: Medicaid Other | Admitting: Physician Assistant

## 2023-02-26 DIAGNOSIS — M62838 Other muscle spasm: Secondary | ICD-10-CM | POA: Diagnosis not present

## 2023-02-26 DIAGNOSIS — M542 Cervicalgia: Secondary | ICD-10-CM

## 2023-02-26 MED ORDER — PREDNISONE 20 MG PO TABS
40.0000 mg | ORAL_TABLET | Freq: Every day | ORAL | 0 refills | Status: AC
Start: 2023-02-26 — End: 2023-03-03

## 2023-02-26 NOTE — Progress Notes (Signed)
Virtual Visit Consent   Kimberly Armstrong, you are scheduled for a virtual visit with a [facility] provider today. Just as with appointments in the office, your consent must be obtained to participate. Your consent will be active for this visit and any virtual visit you may have with one of our providers in the next 365 days. If you have a MyChart account, a copy of this consent can be sent to you electronically.  As this is a virtual visit, video technology does not allow for your provider to perform a traditional examination. This may limit your provider's ability to fully assess your condition. If your provider identifies any concerns that need to be evaluated in person or the need to arrange testing (such as labs, EKG, etc.), we will make arrangements to do so. Although advances in technology are sophisticated, we cannot ensure that it will always work on either your end or our end. If the connection with a video visit is poor, the visit may have to be switched to a telephone visit. With either a video or telephone visit, we are not always able to ensure that we have a secure connection.  By engaging in this virtual visit, you consent to the provision of healthcare and authorize for your insurance to be billed (if applicable) for the services provided during this visit. Depending on your insurance coverage, you may receive a charge related to this service.  I need to obtain your verbal consent now. Are you willing to proceed with your visit today? Kimberly Armstrong has provided verbal consent on 02/26/2023 for a virtual visit (video or telephone). Tylene Fantasia Ward, PA-C  Date: 02/26/2023 9:23 AM  Virtual Visit via Video Note   I, Tylene Fantasia Ward, connected with  Kimberly Armstrong  ([id number], 25-Feb-1981) on 02/26/23 at  9:15 AM EDT by a video-enabled telemedicine application and verified that I am speaking with the correct person using two identifiers.  Location: Patient:  Virtual Visit Location Patient: Home Provider: Virtual Visit Location Provider: Home   I discussed the limitations of evaluation and management by telemedicine and the availability of in person appointments. The patient expressed understanding and agreed to proceed.    History of Present Illness: Kimberly Armstrong is a 42 y.o. who identifies as a female who was assigned female at birth, and is being seen today for muscle spasm around left shoulder blade that started about one month ago. Denies injury or trauma.  She has tried tylenol, ibuprofen, flexeril, and massage with temporary relief. She reports occasional numbness and tingling to the left arm.  She has experienced similar sx in the past that typically do not last this long. [name]  HPI: HPI  Problems:  Patient Active Problem List   Diagnosis Date Noted   Elevated blood pressure reading without diagnosis of hypertension 02/03/2023   Acute pain of right knee 12/30/2022   Constipation 12/30/2022   Other fatigue 11/03/2022   Impaired fasting glucose 11/03/2022   Vitamin D deficiency 11/03/2022   Ovarian hypertrophy 08/18/2022   Fall at home 06/24/2022   Coccyx pain 06/24/2022   Urinary tract infection with hematuria 06/16/2022   Malaise and fatigue 06/16/2022   Pelvic pain in female 11/17/2021   Abdominal pain in female 11/17/2021   Renal mass, left 10/05/2021   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   Tobacco use disorder, continuous 10/05/2021   Raynaud's phenomenon without gangrene 10/05/2021   Generalized abdominal tenderness without rebound tenderness 09/07/2021  Seasonal allergies 09/07/2021   Acute left-sided low back pain without sciatica 09/07/2021   Cold sore 09/07/2021   Encounter for well woman exam with routine gynecological exam 03/15/2021   Neck muscle spasm 03/15/2021   Mild intermittent asthma without complication 03/15/2021   Body mass index (BMI) of 33.0-33.9 in adult 03/15/2021   Screening  for STD (sexually transmitted disease) 03/15/2021   Need for Tdap vaccination 03/15/2021   Encounter to establish care 12/03/2020   Low back pain 12/03/2020   Moderate cigarette smoker 12/03/2020   Encounter for screening mammogram for breast cancer 12/03/2020   Asthma 12[birth year]04-1978    Allergies:  Allergies  Allergen Reactions   Sulfa Antibiotics Rash    Hives   Medications:  Current Outpatient Medications:    predniSONE (DELTASONE) 20 MG tablet, Take 2 tablets (40 mg total) by mouth daily with breakfast for 5 days., Disp: 10 tablet, Rfl: 0   albuterol (VENTOLIN HFA) 108 (90 Base) MCG/ACT inhaler, TAKE 2 PUFFS BY MOUTH EVERY 6 HOURS AS NEEDED FOR WHEEZE OR SHORTNESS OF BREATH, Disp: 18 g, Rfl: 2   budesonide-formoterol (SYMBICORT) 160-4.5 MCG/ACT inhaler, Inhale 2 puffs into the lungs 2 (two) times daily., Disp: 3 each, Rfl: 1   cetirizine (ZYRTEC) 10 MG tablet, TAKE 1 TABLET BY MOUTH EVERY DAY, Disp: 90 tablet, Rfl: 1   cyclobenzaprine (FLEXERIL) 10 MG tablet, TAKE 1/2 TO 1 TABLET BY MOUTH DAILY AT BEDTIME AS NEEDED FOR MUSCLE PAIN, Disp: 30 tablet, Rfl: 2   diclofenac (VOLTAREN) 75 MG EC tablet, TAKE 1 TABLET BY MOUTH TWICE A DAY, Disp: 60 tablet, Rfl: 2   fluticasone (FLONASE) 50 MCG/ACT nasal spray, SPRAY 2 SPRAYS INTO EACH NOSTRIL EVERY DAY, Disp: 16 mL, Rfl: 3   meloxicam (MOBIC) 15 MG tablet, Take by mouth., Disp: , Rfl:    montelukast (SINGULAIR) 10 MG tablet, TAKE 1 TABLET BY MOUTH EVERYDAY AT BEDTIME, Disp: 90 tablet, Rfl: 3   norethindrone (ORTHO MICRONOR) 0.35 MG tablet, Take 1 tablet (0.35 mg total) by mouth daily., Disp: 28 tablet, Rfl: 11   senna-docusate (SENOKOT-S) 8.6-50 MG tablet, Take 1 tablet by mouth at bedtime as needed for mild constipation or moderate constipation., Disp: 30 tablet, Rfl: 5  Observations/Objective: Patient is well-developed, well-nourished in no acute distress.  Resting comfortably at home.  Head is normocephalic, atraumatic.  No labored  breathing.  Speech is clear and coherent with logical content.  Patient is alert and oriented at baseline.    Assessment and Plan: 1. Muscle spasm - predniSONE (DELTASONE) 20 MG tablet; Take 2 tablets (40 mg total) by mouth daily with breakfast for 5 days.  Dispense: 10 tablet; Refill: 0  2. Neck pain on left side  Pt advised to continue flexeril, massage, heat, tylenol as needed along with prednisone.  If no improvement follow up with PCP.   Follow Up Instructions: I discussed the assessment and treatment plan with the patient. The patient was provided an opportunity to ask questions and all were answered. The patient agreed with the plan and demonstrated an understanding of the instructions.  A copy of instructions were sent to the patient via MyChart unless otherwise noted below.     The patient was advised to call back or seek an in-person evaluation if the symptoms worsen or if the condition fails to improve as anticipated.  Time:  I spent 18 minutes with the patient via telehealth technology discussing the above problems/concerns.    Tylene Fantasia Ward, PA-C

## 2023-02-26 NOTE — Patient Instructions (Signed)
Kimberly Armstrong, thank you for joining Tylene Fantasia Ward, PA-C for today's virtual visit.  While this provider is not your primary care provider (PCP), if your PCP is located in our provider database this encounter information will be shared with them immediately following your visit.   A [facility] MyChart account gives you access to today's visit and all your visits, tests, and labs performed at [facility] " click here if you don't have a [facility] MyChart account or go to [email or web address]  Consent: (Patient) Kimberly Armstrong provided verbal consent for this virtual visit at the beginning of the encounter.  Current Medications:  Current Outpatient Medications:    predniSONE (DELTASONE) 20 MG tablet, Take 2 tablets (40 mg total) by mouth daily with breakfast for 5 days., Disp: 10 tablet, Rfl: 0   albuterol (VENTOLIN HFA) 108 (90 Base) MCG/ACT inhaler, TAKE 2 PUFFS BY MOUTH EVERY 6 HOURS AS NEEDED FOR WHEEZE OR SHORTNESS OF BREATH, Disp: 18 g, Rfl: 2   budesonide-formoterol (SYMBICORT) 160-4.5 MCG/ACT inhaler, Inhale 2 puffs into the lungs 2 (two) times daily., Disp: 3 each, Rfl: 1   cetirizine (ZYRTEC) 10 MG tablet, TAKE 1 TABLET BY MOUTH EVERY DAY, Disp: 90 tablet, Rfl: 1   cyclobenzaprine (FLEXERIL) 10 MG tablet, TAKE 1/2 TO 1 TABLET BY MOUTH DAILY AT BEDTIME AS NEEDED FOR MUSCLE PAIN, Disp: 30 tablet, Rfl: 2   diclofenac (VOLTAREN) 75 MG EC tablet, TAKE 1 TABLET BY MOUTH TWICE A DAY, Disp: 60 tablet, Rfl: 2   fluticasone (FLONASE) 50 MCG/ACT nasal spray, SPRAY 2 SPRAYS INTO EACH NOSTRIL EVERY DAY, Disp: 16 mL, Rfl: 3   meloxicam (MOBIC) 15 MG tablet, Take by mouth., Disp: , Rfl:    montelukast (SINGULAIR) 10 MG tablet, TAKE 1 TABLET BY MOUTH EVERYDAY AT BEDTIME, Disp: 90 tablet, Rfl: 3   norethindrone (ORTHO MICRONOR) 0.35 MG tablet, Take 1 tablet (0.35 mg total) by mouth daily., Disp: 28 tablet, Rfl: 11   senna-docusate (SENOKOT-S) 8.6-50 MG tablet,  Take 1 tablet by mouth at bedtime as needed for mild constipation or moderate constipation., Disp: 30 tablet, Rfl: 5   Medications ordered in this encounter:  Meds ordered this encounter  Medications   predniSONE (DELTASONE) 20 MG tablet    Sig: Take 2 tablets (40 mg total) by mouth daily with breakfast for 5 days.    Dispense:  10 tablet    Refill:  0    Order Specific Question:   Supervising Provider    Answer:   Merrilee Jansky [id number]     *If you need refills on other medications prior to your next appointment, please contact your pharmacy*  Follow-Up: Call back or seek an in-person evaluation if the symptoms worsen or if the condition fails to improve as anticipated.  [facility] [phone]  Other Instructions Take prednisone as prescribed.  Take Flexeril as needed.  Avoid NSAIDS like ibuprofen or aleve while taking Prednisone.  May take Tylenol as needed. Recommend massage and heat pad, 20 min on 20 min off.  Follow up with PCP if no improvement.    If you have been instructed to have an in-person evaluation today at a local Urgent Care facility, please use the link below. It will take you to a list of all of our available [place] [facility], including address, phone number and hours of operation. Please do not delay care.  [facility]  If you or a family member do  not have a primary care provider, use the link below to schedule a visit and establish care. When you choose a [facility] primary care physician or advanced practice provider, you gain a long-term partner in health. Find a Primary Care Provider  Learn more about [facility] in-office and virtual care options: [facility]

## 2023-02-28 NOTE — Telephone Encounter (Signed)
I am going to work on this and get it to her as soon as possible.

## 2023-03-01 NOTE — Patient Instructions (Signed)
DUE TO COVID-19 ONLY TWO VISITORS  (aged 44 and older)  ARE ALLOWED TO COME WITH YOU AND STAY IN THE WAITING ROOM ONLY DURING PRE OP AND PROCEDURE.   **NO VISITORS ARE ALLOWED IN THE SHORT STAY AREA OR RECOVERY ROOM!!**  IF YOU WILL BE ADMITTED INTO THE HOSPITAL YOU ARE ALLOWED ONLY FOUR SUPPORT PEOPLE DURING VISITATION HOURS ONLY (7 AM -8PM)   The support person(s) must pass our screening, gel in and out, and wear a mask at all times, including in the patient's room. Patients must also wear a mask when staff or their support person are in the room. Visitors GUEST BADGE MUST BE WORN VISIBLY  One adult visitor may remain with you overnight and MUST be in the room by 8 P.M.     Your procedure is scheduled on: 03/09/23   Report to [facility] Main Entrance    Report to admitting at : 7:10 AM   Call this number if you have problems the morning of surgery [phone]   Eat a light diet the day before surgery.  Examples including soups, broths, toast, yogurt, mashed potatoes.  Things to avoid include carbonated beverages (fizzy beverages), raw fruits and raw vegetables, or beans.   If your bowels are filled with gas, your surgeon will have difficulty visualizing your pelvic organs which increases your surgical risks. Do not eat food :After Midnight.   After Midnight you may have the following liquids until : 6:00 AM DAY OF SURGERY  Water Black Coffee (sugar ok, NO MILK/CREAM OR CREAMERS)  Tea (sugar ok, NO MILK/CREAM OR CREAMERS) regular and decaf                             Plain Jell-O (NO RED)                                           Fruit ices (not with fruit pulp, NO RED)                                     Popsicles (NO RED)                                                                  Juice: apple, WHITE grape, WHITE cranberry Sports drinks like Gatorade (NO RED)              Oral Hygiene is also important to reduce your risk of infection.                                     Remember - BRUSH YOUR TEETH THE MORNING OF SURGERY WITH YOUR REGULAR TOOTHPASTE  DENTURES WILL BE REMOVED PRIOR TO SURGERY PLEASE DO NOT APPLY "Poly grip" OR ADHESIVES!!!   Do NOT smoke after Midnight   Take these medicines the morning of surgery with A SIP OF WATER: cetirizine,prednisone,norethindrone.  DO NOT TAKE ANY ORAL DIABETIC MEDICATIONS DAY OF YOUR SURGERY  You may not have any metal on your body including hair pins, jewelry, and body piercing             Do not wear make-up, lotions, powders, perfumes/cologne, or deodorant  Do not wear nail polish including gel and S&S, artificial/acrylic nails, or any other type of covering on natural nails including finger and toenails. If you have artificial nails, gel coating, etc. that needs to be removed by a nail salon please have this removed prior to surgery or surgery may need to be canceled/ delayed if the surgeon/ anesthesia feels like they are unable to be safely monitored.   Do not shave  48 hours prior to surgery.    Do not bring valuables to the hospital. [place].   Contacts, glasses, or bridgework may not be worn into surgery.   Bring small overnight bag day of surgery.   DO NOT [place]. PHARMACY WILL DISPENSE MEDICATIONS LISTED ON YOUR MEDICATION LIST TO YOU DURING YOUR ADMISSION [place]!    Patients discharged on the day of surgery will not be allowed to drive home.  Someone NEEDS to stay with you for the first 24 hours after anesthesia.   Special Instructions: Bring a copy of your healthcare power of attorney and living will documents         the day of surgery if you haven't scanned them before.              Please read over the following fact sheets you were given: IF YOU HAVE QUESTIONS ABOUT YOUR PRE-OP INSTRUCTIONS PLEASE CALL [phone]    [facility] - Preparing for Surgery Before  surgery, you can play an important role.  Because skin is not sterile, your skin needs to be as free of germs as possible.  You can reduce the number of germs on your skin by washing with CHG (chlorahexidine gluconate) soap before surgery.  CHG is an antiseptic cleaner which kills germs and bonds with the skin to continue killing germs even after washing. Please DO NOT use if you have an allergy to CHG or antibacterial soaps.  If your skin becomes reddened/irritated stop using the CHG and inform your nurse when you arrive at Short Stay. Do not shave (including legs and underarms) for at least 48 hours prior to the first CHG shower.  You may shave your face/neck. Please follow these instructions carefully:  1.  Shower with CHG Soap the night before surgery and the  morning of Surgery.  2.  If you choose to wash your hair, wash your hair first as usual with your  normal  shampoo.  3.  After you shampoo, rinse your hair and body thoroughly to remove the  shampoo.                           4.  Use CHG as you would any other liquid soap.  You can apply chg directly  to the skin and wash                       Gently with a scrungie or clean washcloth.  5.  Apply the CHG Soap to your body ONLY FROM THE NECK DOWN.   Do not use on face/ open  Wound or open sores. Avoid contact with eyes, ears mouth and genitals (private parts).                       Wash face,  Genitals (private parts) with your normal soap.             6.  Wash thoroughly, paying special attention to the area where your surgery  will be performed.  7.  Thoroughly rinse your body with warm water from the neck down.  8.  DO NOT shower/wash with your normal soap after using and rinsing off  the CHG Soap.                9.  Pat yourself dry with a clean towel.            10.  Wear clean pajamas.            11.  Place clean sheets on your bed the night of your first shower and do not  sleep with pets. Day of Surgery  : Do not apply any lotions/deodorants the morning of surgery.  Please wear clean clothes to the hospital/surgery center.  FAILURE TO FOLLOW THESE INSTRUCTIONS MAY RESULT IN THE CANCELLATION OF YOUR SURGERY PATIENT SIGNATURE_________________________________  NURSE SIGNATURE__________________________________  ________________________________________________________________________ WHAT IS A BLOOD TRANSFUSION? Blood Transfusion Information  A transfusion is the replacement of blood or some of its parts. Blood is made up of multiple cells which provide different functions. Red blood cells carry oxygen and are used for blood loss replacement. White blood cells fight against infection. Platelets control bleeding. Plasma helps clot blood. Other blood products are available for specialized needs, such as hemophilia or other clotting disorders. BEFORE THE TRANSFUSION  Who gives blood for transfusions?  Healthy volunteers who are fully evaluated to make sure their blood is safe. This is blood bank blood. Transfusion therapy is the safest it has ever been in the practice of medicine. Before blood is taken from a donor, a complete history is taken to make sure that person has no history of diseases nor engages in risky social behavior (examples are intravenous drug use or sexual activity with multiple partners). The donor's travel history is screened to minimize risk of transmitting infections, such as malaria. The donated blood is tested for signs of infectious diseases, such as HIV and hepatitis. The blood is then tested to be sure it is compatible with you in order to minimize the chance of a transfusion reaction. If you or a relative donates blood, this is often done in anticipation of surgery and is not appropriate for emergency situations. It takes many days to process the donated blood. RISKS AND COMPLICATIONS Although transfusion therapy is very safe and saves many lives, the main dangers of  transfusion include:  Getting an infectious disease. Developing a transfusion reaction. This is an allergic reaction to something in the blood you were given. Every precaution is taken to prevent this. The decision to have a blood transfusion has been considered carefully by your caregiver before blood is given. Blood is not given unless the benefits outweigh the risks. AFTER THE TRANSFUSION Right after receiving a blood transfusion, you will usually feel much better and more energetic. This is especially true if your red blood cells have gotten low (anemic). The transfusion raises the level of the red blood cells which carry oxygen, and this usually causes an energy increase. The nurse administering the transfusion will monitor you carefully for complications.  HOME CARE INSTRUCTIONS  No special instructions are needed after a transfusion. You may find your energy is better. Speak with your caregiver about any limitations on activity for underlying diseases you may have. SEEK MEDICAL CARE IF:  Your condition is not improving after your transfusion. You develop redness or irritation at the intravenous (IV) site. SEEK IMMEDIATE MEDICAL CARE IF:  Any of the following symptoms occur over the next 12 hours: Shaking chills. You have a temperature by mouth above 102 F (38.9 C), not controlled by medicine. Chest, back, or muscle pain. People around you feel you are not acting correctly or are confused. Shortness of breath or difficulty breathing. Dizziness and fainting. You get a rash or develop hives. You have a decrease in urine output. Your urine turns a dark color or changes to pink, red, or brown. Any of the following symptoms occur over the next 10 days: You have a temperature by mouth above 102 F (38.9 C), not controlled by medicine. Shortness of breath. Weakness after normal activity. The white part of the eye turns yellow (jaundice). You have a decrease in the amount of urine or are  urinating less often. Your urine turns a dark color or changes to pink, red, or brown. Document Released: 08/13/2000 Document Revised: 11/08/2011 Document Reviewed: 04/01/2008 [facility] Patient Information 2014 [place], [place].  _______________________________________________________________________

## 2023-03-02 ENCOUNTER — Encounter (HOSPITAL_COMMUNITY): Payer: Self-pay

## 2023-03-02 ENCOUNTER — Other Ambulatory Visit: Payer: Self-pay

## 2023-03-02 ENCOUNTER — Telehealth: Payer: Self-pay | Admitting: *Deleted

## 2023-03-02 ENCOUNTER — Encounter (HOSPITAL_COMMUNITY)
Admission: RE | Admit: 2023-03-02 | Discharge: 2023-03-02 | Disposition: A | Discharge status: Home / Self Care | Payer: Medicaid Other | Source: Ambulatory Visit | Attending: Gynecologic Oncology | Admitting: Gynecologic Oncology

## 2023-03-02 DIAGNOSIS — D487 Neoplasm of uncertain behavior of other specified sites: Secondary | ICD-10-CM | POA: Diagnosis not present

## 2023-03-02 DIAGNOSIS — N888 Other specified noninflammatory disorders of cervix uteri: Secondary | ICD-10-CM | POA: Diagnosis not present

## 2023-03-02 DIAGNOSIS — Z01812 Encounter for preprocedural laboratory examination: Secondary | ICD-10-CM | POA: Insufficient documentation

## 2023-03-02 HISTORY — DX: Anemia, unspecified: D64.9

## 2023-03-02 LAB — CBC
HCT: 47.4 % — ABNORMAL HIGH (ref 36.0–46.0)
Hemoglobin: 16.2 g/dL — ABNORMAL HIGH (ref 12.0–15.0)
MCH: 31.2 pg (ref 26.0–34.0)
MCHC: 34.2 g/dL (ref 30.0–36.0)
MCV: 91.2 fL (ref 80.0–100.0)
Platelets: 201 10*3/uL (ref 150–400)
RBC: 5.2 MIL/uL — ABNORMAL HIGH (ref 3.87–5.11)
RDW: 12.7 % (ref 11.5–15.5)
WBC: 11 10*3/uL — ABNORMAL HIGH (ref 4.0–10.5)
nRBC: 0 % (ref 0.0–0.2)

## 2023-03-02 LAB — COMPREHENSIVE METABOLIC PANEL
ALT: 29 U/L (ref 0–44)
AST: 20 U/L (ref 15–41)
Albumin: 4.1 g/dL (ref 3.5–5.0)
Alkaline Phosphatase: 66 U/L (ref 38–126)
Anion gap: 10 (ref 5–15)
BUN: 11 mg/dL (ref 6–20)
CO2: 23 mmol/L (ref 22–32)
Calcium: 9.3 mg/dL (ref 8.9–10.3)
Chloride: 107 mmol/L (ref 98–111)
Creatinine, Ser: 0.86 mg/dL (ref 0.44–1.00)
GFR, Estimated: >60 mL/min (ref >60)
Glucose, Bld: 124 mg/dL — ABNORMAL HIGH (ref 70–99)
Potassium: 3.8 mmol/L (ref 3.5–5.1)
Sodium: 140 mmol/L (ref 135–145)
Total Bilirubin: 0.5 mg/dL (ref 0.3–1.2)
Total Protein: 7.3 g/dL (ref 6.5–8.1)

## 2023-03-02 LAB — TYPE AND SCREEN
ABO/RH(D): A POS
Antibody Screen: NEGATIVE

## 2023-03-02 NOTE — Progress Notes (Signed)
For Short Stay: COVID SWAB appointment date:  Bowel Prep reminder:   For Anesthesia: PCP - Herbert Seta Boscia:P NP Cardiologist - N/A  Chest x-ray -  EKG -  Stress Test -  ECHO -  Cardiac Cath -  Pacemaker/ICD device last checked: Pacemaker orders received: Device Rep notified:  Spinal Cord Stimulator:  Sleep Study - N/A CPAP -   Fasting Blood Sugar - N/A Checks Blood Sugar _____ times a day Date and result of last Hgb A1c-  Last dose of GLP1 agonist- N/A GLP1 instructions:   Last dose of SGLT-2 inhibitors- N/A SGLT-2 instructions:   Blood Thinner Instructions: N/A Aspirin Instructions: Last Dose:  Activity level: Can go up a flight of stairs and activities of daily living without stopping and without chest pain and/or shortness of breath   Able to exercise without chest pain and/or shortness of breath  Anesthesia review: Hx: Smoker  Patient denies shortness of breath, fever, cough and chest pain at PAT appointment   Patient verbalized understanding of instructions that were given to them at the PAT appointment. Patient was also instructed that they will need to review over the PAT instructions again at home before surgery.

## 2023-03-02 NOTE — Telephone Encounter (Signed)
Spoke with the patient and moved her appts from 7/11 to 7/5

## 2023-03-04 ENCOUNTER — Inpatient Hospital Stay: Payer: Medicaid Other | Attending: Gynecologic Oncology | Admitting: Gynecologic Oncology

## 2023-03-04 ENCOUNTER — Inpatient Hospital Stay: Payer: Medicaid Other | Admitting: Gynecologic Oncology

## 2023-03-04 ENCOUNTER — Telehealth: Payer: Self-pay | Admitting: *Deleted

## 2023-03-04 ENCOUNTER — Encounter: Payer: Self-pay | Admitting: Gynecologic Oncology

## 2023-03-04 ENCOUNTER — Other Ambulatory Visit: Payer: Self-pay | Admitting: Urology

## 2023-03-04 ENCOUNTER — Other Ambulatory Visit: Payer: Self-pay | Admitting: Gynecologic Oncology

## 2023-03-04 ENCOUNTER — Other Ambulatory Visit: Payer: Self-pay

## 2023-03-04 VITALS — BP 142/87 | HR 77 | Temp 98.8°F | Wt 243.8 lb

## 2023-03-04 DIAGNOSIS — Z803 Family history of malignant neoplasm of breast: Secondary | ICD-10-CM | POA: Insufficient documentation

## 2023-03-04 DIAGNOSIS — Z79899 Other long term (current) drug therapy: Secondary | ICD-10-CM | POA: Insufficient documentation

## 2023-03-04 DIAGNOSIS — R102 Pelvic and perineal pain: Secondary | ICD-10-CM | POA: Insufficient documentation

## 2023-03-04 DIAGNOSIS — Z87440 Personal history of urinary (tract) infections: Secondary | ICD-10-CM | POA: Diagnosis not present

## 2023-03-04 DIAGNOSIS — J45909 Unspecified asthma, uncomplicated: Secondary | ICD-10-CM | POA: Insufficient documentation

## 2023-03-04 DIAGNOSIS — Z7951 Long term (current) use of inhaled steroids: Secondary | ICD-10-CM | POA: Diagnosis not present

## 2023-03-04 DIAGNOSIS — E669 Obesity, unspecified: Secondary | ICD-10-CM

## 2023-03-04 DIAGNOSIS — F17210 Nicotine dependence, cigarettes, uncomplicated: Secondary | ICD-10-CM | POA: Diagnosis not present

## 2023-03-04 DIAGNOSIS — Z9079 Acquired absence of other genital organ(s): Secondary | ICD-10-CM | POA: Insufficient documentation

## 2023-03-04 DIAGNOSIS — Z7189 Other specified counseling: Secondary | ICD-10-CM

## 2023-03-04 DIAGNOSIS — N888 Other specified noninflammatory disorders of cervix uteri: Secondary | ICD-10-CM

## 2023-03-04 DIAGNOSIS — Z90710 Acquired absence of both cervix and uterus: Secondary | ICD-10-CM | POA: Insufficient documentation

## 2023-03-04 MED ORDER — OXYCODONE HCL 5 MG PO TABS
5.0000 mg | ORAL_TABLET | ORAL | 0 refills | Status: DC | PRN
Start: 2023-03-04 — End: 2023-05-10

## 2023-03-04 MED ORDER — IBUPROFEN 800 MG PO TABS
800.0000 mg | ORAL_TABLET | Freq: Three times a day (TID) | ORAL | 0 refills | Status: DC | PRN
Start: 2023-03-04 — End: 2023-05-10

## 2023-03-04 NOTE — Telephone Encounter (Signed)
-----   Message from Doylene Bode, NP sent at 03/04/2023  9:10 AM EDT ----- Patient is coming in today to see [place] for preop discussion.  On her preoperative labs her white count is elevated at 11.  Please reach out and see if she has any symptoms of infection including fever, chills, pain, cough, dysuria. If none, no intervention needed.

## 2023-03-04 NOTE — Telephone Encounter (Signed)
PC to patient regarding elevated WBC.  Patient denies any fever, chills, sore throat, cough, or dysuria.  She does have pelvic & shoulder pain.  Alonza Smoker NP informed, patient to come to appointment as expected today.  Patient informed, she verbalizes understanding.

## 2023-03-04 NOTE — Patient Instructions (Addendum)
We will plan on rechecking your white blood cell count the morning of surgery.   Preparing for your Surgery  Plan for surgery on March 09, 2023 with Dr. Eugene Garnet at [facility]. You will be scheduled for robotic assisted laparoscopic trachelectomy.   Pre-operative Testing -(Done) You will receive a phone call from presurgical testing at [facility] to arrange for a pre-operative appointment and lab work.  -Bring your insurance card, copy of an advanced directive if applicable, medication list  -At that visit, you will be asked to sign a consent for a possible blood transfusion in case a transfusion becomes necessary during surgery.  The need for a blood transfusion is rare but having consent is a necessary part of your care.     -You should not be taking blood thinners or aspirin at least ten days prior to surgery unless instructed by your surgeon.  -Do not take supplements such as fish oil (omega 3), red yeast rice, turmeric before your surgery. You want to avoid medications with aspirin in them including headache powders such as BC or Goody's), Excedrin migraine.  Day Before Surgery at Home -You will be asked to take in a light diet the day before surgery. You will be advised you can have clear liquids up until 3 hours before your surgery.    Eat a light diet the day before surgery.  Examples including soups, broths, toast, yogurt, mashed potatoes.  AVOID GAS PRODUCING FOODS AND BEVERAGES. Things to avoid include carbonated beverages (fizzy beverages, sodas), raw fruits and raw vegetables (uncooked), or beans.   If your bowels are filled with gas, your surgeon will have difficulty visualizing your pelvic organs which increases your surgical risks.  Your role in recovery Your role is to become active as soon as directed by your doctor, while still giving yourself time to heal.  Rest when you feel tired. You will be asked to do the following in order to speed your  recovery:  - Cough and breathe deeply. This helps to clear and expand your lungs and can prevent pneumonia after surgery.  - STAY ACTIVE WHEN YOU GET HOME. Do mild physical activity. Walking or moving your legs help your circulation and body functions return to normal. Do not try to get up or walk alone the first time after surgery.   -If you develop swelling on one leg or the other, pain in the back of your leg, redness/warmth in one of your legs, please call the office or go to the Emergency Room to have a doppler to rule out a blood clot. For shortness of breath, chest pain-seek care in the Emergency Room as soon as possible. - Actively manage your pain. Managing your pain lets you move in comfort. We will ask you to rate your pain on a scale of zero to 10. It is your responsibility to tell your doctor or nurse where and how much you hurt so your pain can be treated.  Special Considerations -If you are diabetic, you may be placed on insulin after surgery to have closer control over your blood sugars to promote healing and recovery.  This does not mean that you will be discharged on insulin.  If applicable, your oral antidiabetics will be resumed when you are tolerating a solid diet.  -Your final pathology results from surgery should be available around one week after surgery and the results will be relayed to you when available.  -FMLA forms can be faxed to [phone]  and please allow 5-7 business days for completion.  Pain Management After Surgery -You have been prescribed your pain medication and bowel regimen medications before surgery so that you can have these available when you are discharged from the hospital. The pain medication is for use ONLY AFTER surgery and a new prescription will not be given.   -Make sure that you have Tylenol and Ibuprofen IF YOU ARE ABLE TO TAKE THESE MEDICATIONS at home to use on a regular basis after surgery for pain control. We recommend alternating the  medications every hour to six hours since they work differently and are processed in the body differently for pain relief.  -Review the attached handout on narcotic use and their risks and side effects.   Bowel Regimen -Plan to take Sennakot-S nightly to prevent constipation especially if you are taking the narcotic pain medication intermittently.  It is important to prevent constipation and drink adequate amounts of liquids. You can stop taking this medication daily when you are not taking pain medication and you are back on your normal bowel routine.  Risks of Surgery Risks of surgery are low but include bleeding, infection, damage to surrounding structures, re-operation, blood clots, and very rarely death.   Blood Transfusion Information (For the consent to be signed before surgery)  We will be checking your blood type before surgery so in case of emergencies, we will know what type of blood you would need.                                            WHAT IS A BLOOD TRANSFUSION?  A transfusion is the replacement of blood or some of its parts. Blood is made up of multiple cells which provide different functions. Red blood cells carry oxygen and are used for blood loss replacement. White blood cells fight against infection. Platelets control bleeding. Plasma helps clot blood. Other blood products are available for specialized needs, such as hemophilia or other clotting disorders. BEFORE THE TRANSFUSION  Who gives blood for transfusions?  You may be able to donate blood to be used at a later date on yourself (autologous donation). Relatives can be asked to donate blood. This is generally not any safer than if you have received blood from a stranger. The same precautions are taken to ensure safety when a relative's blood is donated. Healthy volunteers who are fully evaluated to make sure their blood is safe. This is blood bank blood. Transfusion therapy is the safest it has ever been in the  practice of medicine. Before blood is taken from a donor, a complete history is taken to make sure that person has no history of diseases nor engages in risky social behavior (examples are intravenous drug use or sexual activity with multiple partners). The donor's travel history is screened to minimize risk of transmitting infections, such as malaria. The donated blood is tested for signs of infectious diseases, such as HIV and hepatitis. The blood is then tested to be sure it is compatible with you in order to minimize the chance of a transfusion reaction. If you or a relative donates blood, this is often done in anticipation of surgery and is not appropriate for emergency situations. It takes many days to process the donated blood. RISKS AND COMPLICATIONS Although transfusion therapy is very safe and saves many lives, the main dangers of transfusion include:  Getting  an infectious disease. Developing a transfusion reaction. This is an allergic reaction to something in the blood you were given. Every precaution is taken to prevent this. The decision to have a blood transfusion has been considered carefully by your caregiver before blood is given. Blood is not given unless the benefits outweigh the risks.  AFTER SURGERY INSTRUCTIONS  Return to work: 4-6 weeks if applicable  Activity: 1. Be up and out of the bed during the day.  Take a nap if needed.  You may walk up steps but be careful and use the hand rail.  Stair climbing will tire you more than you think, you may need to stop part way and rest.   2. No lifting or straining for 6 weeks over 10 pounds. No pushing, pulling, straining for 6 weeks.  3. No driving for around 1 week(s).  Do not drive if you are taking narcotic pain medicine and make sure that your reaction time has returned.   4. You can shower as soon as the next day after surgery. Shower daily.  Use your regular soap and water (not directly on the incision) and pat your incision(s)  dry afterwards; don't rub.  No tub baths or submerging your body in water until cleared by your surgeon. If you have the soap that was given to you by pre-surgical testing that was used before surgery, you do not need to use it afterwards because this can irritate your incisions.   5. No sexual activity and nothing in the vagina for 10-12 weeks.  6. You may experience a small amount of clear drainage from your incisions, which is normal.  If the drainage persists, increases, or changes color please call the office.  7. Do not use creams, lotions, or ointments such as neosporin on your incisions after surgery until advised by your surgeon because they can cause removal of the dermabond glue on your incisions.    8. You may experience vaginal spotting after surgery or around the 6-8 week mark from surgery when the stitches at the top of the vagina begin to dissolve.  The spotting is normal but if you experience heavy bleeding, call our office.  9. Take Tylenol or ibuprofen first for pain if you are able to take these medications and only use narcotic pain medication for severe pain not relieved by the Tylenol or Ibuprofen.  Monitor your Tylenol intake to a max of 4,000 mg in a 24 hour period. You can alternate these medications after surgery.  Diet: 1. Low sodium Heart Healthy Diet is recommended but you are cleared to resume your normal (before surgery) diet after your procedure.  2. It is safe to use a laxative, such as Miralax or Colace, if you have difficulty moving your bowels. You can use Sennakot-S to take at bedtime every evening after surgery to keep bowel movements regular and to prevent constipation.    Wound Care: 1. Keep clean and dry.  Shower daily.  Reasons to call the Doctor: Fever - Oral temperature greater than 100.4 degrees Fahrenheit Foul-smelling vaginal discharge Difficulty urinating Nausea and vomiting Increased pain at the site of the incision that is unrelieved with  pain medicine. Difficulty breathing with or without chest pain New calf pain especially if only on one side Sudden, continuing increased vaginal bleeding with or without clots.   Contacts: For questions or concerns you should contact:  Dr. Eugene Garnet at [phone]  [name], NP at [phone]  After Hours: call [phone] and  have the GYN Oncologist paged/contacted (after 5 pm or on the weekends). You will speak with an after hours RN and let he or she know you have had surgery.  Messages sent via mychart are for non-urgent matters and are not responded to after hours so for urgent needs, please call the after hours number.

## 2023-03-04 NOTE — Progress Notes (Signed)
Gynecologic Oncology Return Clinic Visit  03/04/23  Reason for Visit: treatment planning  Treatment History: The patient had her last delivery by C-section in 2017.  She had a tubal ligation at that time.  Shortly after she delivered, she had an IUD placed that was expelled after several heavy menses.  She denies having any pain prior to this last C-section just irregular and heavy bleeding.  Later in 2017, she underwent partial hysterectomy.  She remembers a discussion about leaving the ovaries in situ but does not remember discussion about leaving the cervix.  The physician who performed her hysterectomy in [place] has since passed away.  She [place] [facility] [place] [facility] [place].   Over the last 6 years, she began having pain similar to menstrual cramping.  Initially, this happened once a month with some mild cramping.  Some months she would not have any pain.  She describes this as cramping in her deep pelvis.  Within the last year and a half, she has noticed increased pain as well as increased frequency of the pain.  She now has cramping for almost 2 weeks of every month.  She has been treated for urinary tract infections, has tried changing her diet, without any effect on her pain.  She was recently started on Orilissa at the higher dose.  This helped immediately but she developed significant hot flashes, mood swings, and other symptoms.  She ultimately decided to stop the Orilissa.  She had recurrence of her pain within a couple of days after stopping the medication with some worsening of the pain.  She restarted the Orilissa at half the dose, which helped some but she had persistent symptoms.  She ultimately decided to take a more naturalistic approach.  She stopped the Orilissa 3 weeks ago.  She is taking a multivitamin for women, pre and probiotic, and a vitamin B complex.  These interventions have helped significantly.  She denies any severe pain.  If she develops dull pain, she  uses Tylenol, ibuprofen, and/or a muscle relaxant.   In early 2023, she had multiple imaging studies to workup her symptoms. 09/10/21: Pelvic ultrasound reveals surgically absent uterus.  In the posterior aspect of the vaginal cuff is a poorly defined hypoechoic nodule measuring 2.2 x 1.5 x 1.7 cm that is incompletely characterized on transabdominal exam.  There is a slightly more hyperechoic nodule in the superior aspect of the vaginal cuff measuring 1.5 x 1.1 x 1.4 cm that causes mass mild effect on the bladder base.  Right ovary measures up to 4.7 cm without dominant cyst lesion.  Left ovary measures up to 6.1 cm again without dominant cystic lesion.  Both ovaries noted to be prominent in size although no definitive cystic or solid lesions seen. 09/17/2021: MRI of the pelvis shows 1.9 x 1.7 x 1.8 cm complex cystic mass in the central cervical stroma.  Cervical canal is not well-demonstrated but this lesion may be related to the canal or in the cervical stroma.  Could represent tiny cluster of multiple cyst or single multicystic lesion.  Large nabothian cyst would also be a consideration.  Cystic cervicitis is in the differential and adenoma malignancy cannot be excluded.  Second smaller nodular component along the anterior wall of the cervix is immediately anterior to the above lesion and measures up to 1.3 cm.  It generates some mass effect on the posterior wall of the urinary bladder, does not appear overtly aggressive and may simply represent   scar.  Unremarkable appearance of the ovaries.   10/15/21: Pap and ECC. Pap was NIML. ECC showed scant benign glandular and squamous tissue.   Received records from [facility] in [place].  Patient was admitted in December 2017 for surgery in the setting of abnormal uterine bleeding and severe dysmenorrhea.  She underwent supracervical hysterectomy, bilateral salpingectomy, and excision of abdominal wall mass via Pfannenstiel incision.  Findings at the time of  surgery included some bladder adhesions in the setting of her prior C-sections.  Posterior cul-de-sac was described as "clear".  Normal-appearing bilateral tubes and ovaries.  There is not mention of significant pelvic adhesive disease.  There is not mention of any endometriosis within the pelvis.  It is unclear to me by the operative report why a supracervical hysterectomy was performed.   The report shows proliferative endometrium, no hyperplasia or atypia.  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  Bilateral fallopian tubes without significant pathology.  Abdominal wall mass that had been excised showed benign fibrous tissue and skeletal muscle with fibrous and reactive changes.  No endometriosis, atypia or malignancy.  Interval History: Has noticed some benefit after starting norethindrone but over the last month has had worsening of her pelvic pain.  Describes this as low pelvic with radiation to bilateral sides.  She now endorses about a month of all day dull pain.  She denies any associated vaginal bleeding or discharge.  She endorses normal bowel function, denies any urinary symptoms.  Past Medical/Surgical History: Past Medical History:  Diagnosis Date   Anemia    Asthma    TID use currently 2.2024   Endometriosis    Tobacco abuse     Past Surgical History:  Procedure Laterality Date   BACK SURGERY     carpel tunnel     CESAREAN SECTION     x3   ENDOMETRIAL ABLATION     EYE SURGERY N/A    [facility] 11/30/2020   OTHER SURGICAL HISTORY  2013   removal of scar tissue (endometriosis) from Pfannenstiel incision   PARTIAL HYSTERECTOMY  2017   cervix, ovaries left in situ   SPINE SURGERY N/A    [facility] 11/30/2020   TUBAL LIGATION N/A    [facility] 11/30/2020, at time of last section    Family History  Problem Relation Age of Onset   Breast cancer Mother    Heart attack Mother    Hypertension Mother    Diabetes Sister    Breast cancer Maternal Grandmother    Dementia  Paternal Grandmother    Colon cancer Neg Hx    Esophageal cancer Neg Hx    Stomach cancer Neg Hx    Colon polyps Neg Hx    Pancreatic cancer Neg Hx     Social History   Socioeconomic History   Marital status: Married    Spouse name: Mark Kendle Jr   Number of children: 3   Years of education: Associates degree   Highest education level: Associate degree: occupational, technical, or vocational program  Occupational History   Occupation: Customer Service Rep    Comment: [employer]  Tobacco Use   Smoking status: Every Day    Packs/day: 0.50    Years: 25.00    Additional pack years: 0.00    Total pack years: 12.50    Types: Cigarettes    Start date: 12/03/2000   Smokeless tobacco: Never  Vaping Use   Vaping Use: Never used  Substance and Sexual Activity   Alcohol use: Yes      Alcohol/week: 2.0 standard drinks of alcohol    Types: 2 Glasses of wine per week    Comment: occasionally   Drug use: Never   Sexual activity: Yes    Partners: Male    Birth control/protection: Surgical    Comment: supracervical hysterectomy, menarche 42yo, sexual debut 42yo  Other Topics Concern   Not on file  Social History Narrative   Not on file   Social Determinants of Health   Financial Resource Strain: Not on file  Food Insecurity: Not on file  Transportation Needs: Not on file  Physical Activity: Not on file  Stress: Not on file  Social Connections: Not on file    Current Medications:  Current Outpatient Medications:    ibuprofen (ADVIL) 800 MG tablet, Take 1 tablet (800 mg total) by mouth every 8 (eight) hours as needed for moderate pain. For AFTER surgery only, Disp: 30 tablet, Rfl: 0   oxyCODONE (OXY IR/ROXICODONE) 5 MG immediate release tablet, Take 1 tablet (5 mg total) by mouth every 4 (four) hours as needed for severe pain. For AFTER surgery only, do not take and drive, Disp: 20 tablet, Rfl: 0   acetaminophen (TYLENOL) 500 MG tablet, Take 1,000 mg by mouth every 6 (six) hours as  needed for moderate pain., Disp: , Rfl:    albuterol (VENTOLIN HFA) 108 (90 Base) MCG/ACT inhaler, TAKE 2 PUFFS BY MOUTH EVERY 6 HOURS AS NEEDED FOR WHEEZE OR SHORTNESS OF BREATH, Disp: 18 g, Rfl: 2   Ascorbic Acid (VITAMIN C PO), Take 1 tablet by mouth daily., Disp: , Rfl:    b complex vitamins capsule, Take 1 capsule by mouth daily., Disp: , Rfl:    budesonide-formoterol (SYMBICORT) 160-4.5 MCG/ACT inhaler, Inhale 2 puffs into the lungs 2 (two) times daily. (Patient not taking: Reported on 02/28/2023), Disp: 3 each, Rfl: 1   cetirizine (ZYRTEC) 10 MG tablet, TAKE 1 TABLET BY MOUTH EVERY DAY, Disp: 90 tablet, Rfl: 1   cyclobenzaprine (FLEXERIL) 10 MG tablet, TAKE 1/2 TO 1 TABLET BY MOUTH DAILY AT BEDTIME AS NEEDED FOR MUSCLE PAIN, Disp: 30 tablet, Rfl: 2   fluticasone (FLONASE) 50 MCG/ACT nasal spray, SPRAY 2 SPRAYS INTO EACH NOSTRIL EVERY DAY (Patient taking differently: Place 2 sprays into both nostrils daily as needed for allergies.), Disp: 16 mL, Rfl: 3   meloxicam (MOBIC) 15 MG tablet, Take 15 mg by mouth daily., Disp: , Rfl:    montelukast (SINGULAIR) 10 MG tablet, TAKE 1 TABLET BY MOUTH EVERYDAY AT BEDTIME, Disp: 90 tablet, Rfl: 3   mupirocin ointment (BACTROBAN) 2 %, Apply 1 Application topically daily as needed (wound care)., Disp: , Rfl:    norethindrone (ORTHO MICRONOR) 0.35 MG tablet, Take 1 tablet (0.35 mg total) by mouth daily., Disp: 28 tablet, Rfl: 11   senna-docusate (SENOKOT-S) 8.6-50 MG tablet, Take 1 tablet by mouth at bedtime as needed for mild constipation or moderate constipation., Disp: 30 tablet, Rfl: 5  Review of Systems: Denies appetite changes, fevers, chills, fatigue, unexplained weight changes. Denies hearing loss, neck lumps or masses, mouth sores, ringing in ears or voice changes. Denies cough or wheezing.  Denies shortness of breath. Denies chest pain or palpitations. Denies leg swelling. Denies abdominal distention, blood in stools, constipation, diarrhea, nausea,  vomiting, or early satiety. Denies pain with intercourse, dysuria, frequency, hematuria or incontinence. Denies hot flashes, vaginal bleeding or vaginal discharge.   Denies joint pain, back pain or muscle pain/cramps. Denies itching, rash, or wounds. Denies dizziness, headaches, numbness or seizures.   Denies swollen lymph nodes or glands, denies easy bruising or bleeding. Denies anxiety, depression, confusion, or decreased concentration.  Physical Exam: BP (!) 142/87 (BP Location: Left Arm, Patient Position: Sitting)   Pulse 77   Temp 98.8 F (37.1 C) (Oral)   Wt 243 lb 12.8 oz (110.6 kg)   SpO2 99%   BMI 34.98 kg/m  General: Alert, oriented, no acute distress. HEENT: Normocephalic, atraumatic, sclera anicteric. Chest: Unlabored breathing on room air.  Laboratory & Radiologic Studies: 01/04/23: Uterus Surgically absent. A septated hypoechoic region is identified in the region of the vaginal cuff measuring 1.4 x 1.2 x 0.9 cm and another adjacent hypoechoic region measures 1.2 x 1.1 x 0.8 cm. No internal blood flow.   Right ovary Measurements: 1.9 x 1.5 x 1.8 cm = volume: 2.8 mL. Normal appearance/no adnexal mass.   Left ovary Measurements: 1.1 x 0.8 x 1.1 cm = volume: 0.5 mL. Normal appearance/no adnexal mass.   Other findings No abnormal free fluid.   IMPRESSION: 1. The 2 hypoechoic masses described above could represent nabothian cysts if the patient's hysterectomy was supracervical. The masses are indeterminate if the patient's cervix was removed. Recommend clinical correlation. 2. The ovaries are unremarkable.  Assessment & Plan: Kimberly Armstrong is a 41 y.o. woman with continued pain after supracervical hysterectomy.   Patient continues to have deep pelvic pain despite suppression with norethindrone.  Her pain is more constant now and seems localized to the deep pelvis/cervix.  Discussed recent pelvic ultrasound which shows findings most consistent with  nabothian cyst within her cervical stump.  Unremarkable ovaries.  Discussed findings again from her surgery as well as pathology.  There is no description of exam findings consistent with endometriosis and no endometriosis noted on her final pathology.  We discussed the possibility that surgery to remove her cervix may not change/improve her pain.  She voices understanding.  We also discussed that there is some scar tissue between her bladder and cervix, noted at the time of her supracervical hysterectomy.  There is likely been development of additional scar tissue after her hysterectomy.  Will reach out to [facility] about possibility of ICG injection into the ureters at the start of surgery.  Patient's white blood cell count was elevated earlier this week.  She tells me today that last weekend she was put on a prednisone taper for some left shoulder/back pain.  She has now finished steroids.  We will plan to recheck CBC the morning of surgery.  We discussed plan for her ovaries.  Without visual evidence or pathologic evidence of endometriosis at the time of her last surgery, I am hesitant to recommend removing bilateral ovaries for treatment of endometriosis.  We discussed the risk and benefits of leaving the ovaries versus taking the mall and putting her on hormone replacement.  Ultimately, as long as they are normal in appearance, even if I find evidence of endometriosis, she would like to keep her ovaries.  We reviewed the plan for robotic assisted trachelectomy, possible unilateral versus bilateral oophorectomy, possible laparotomy.  The patient and I discussed that even if there is evidence of endometriosis, as long as the ovaries look normal, preference is that I leave them in situ.  The risks of surgery were discussed in detail and she understands these to include infection; wound separation; hernia; vaginal cuff separation, injury to adjacent organs such as bowel, bladder, blood vessels,  ureters and nerves; bleeding which may require blood transfusion; anesthesia risk; thromboembolic events; possible   death; unforeseen complications; possible need for re-exploration; medical complications such as heart attack, stroke, pleural effusion and pneumonia. The patient will receive DVT and antibiotic prophylaxis as indicated. She voiced a clear understanding. She had the opportunity to ask questions. Perioperative instructions were reviewed with her. Prescriptions for post-op medications were sent to her pharmacy of choice.  22 minutes of total time was spent for this patient encounter, including preparation, face-to-face counseling with the patient and coordination of care, and documentation of the encounter.  Deslyn Cavenaugh, MD  Division of Gynecologic Oncology  Department of Obstetrics and Gynecology  [facility]   

## 2023-03-04 NOTE — H&P (View-Only) (Signed)
Gynecologic Oncology Return Clinic Visit  03/04/23  Reason for Visit: treatment planning  Treatment History: The patient had her last delivery by C-section in 2017.  She had a tubal ligation at that time.  Shortly after she delivered, she had an IUD placed that was expelled after several heavy menses.  She denies having any pain prior to this last C-section just irregular and heavy bleeding.  Later in 2017, she underwent partial hysterectomy.  She remembers a discussion about leaving the ovaries in situ but does not remember discussion about leaving the cervix.  The physician who performed her hysterectomy in [place] has since passed away.  [facility] [place].   Over the last 6 years, she began having pain similar to menstrual cramping.  Initially, this happened once a month with some mild cramping.  Some months she would not have any pain.  She describes this as cramping in her deep pelvis.  Within the last year and a half, she has noticed increased pain as well as increased frequency of the pain.  She now has cramping for almost 2 weeks of every month.  She has been treated for urinary tract infections, has tried changing her diet, without any effect on her pain.  She was recently started on Orilissa at the higher dose.  This helped immediately but she developed significant hot flashes, mood swings, and other symptoms.  She ultimately decided to stop the [place].  She had recurrence of her pain within a couple of days after stopping the medication with some worsening of the pain.  She restarted the Orilissa at half the dose, which helped some but she had persistent symptoms.  She ultimately decided to take a more naturalistic approach.  She stopped the [place] 3 weeks ago.  She is taking a multivitamin for women, pre and probiotic, and a vitamin B complex.  These interventions have helped significantly.  She denies any severe pain.  If she develops dull pain, she  uses Tylenol, ibuprofen, and/or a muscle relaxant.   In early 2023, she had multiple imaging studies to workup her symptoms. 09/10/21: Pelvic ultrasound reveals surgically absent uterus.  In the posterior aspect of the vaginal cuff is a poorly defined hypoechoic nodule measuring 2.2 x 1.5 x 1.7 cm that is incompletely characterized on transabdominal exam.  There is a slightly more hyperechoic nodule in the superior aspect of the vaginal cuff measuring 1.5 x 1.1 x 1.4 cm that causes mass mild effect on the bladder base.  Right ovary measures up to 4.7 cm without dominant cyst lesion.  Left ovary measures up to 6.1 cm again without dominant cystic lesion.  Both ovaries noted to be prominent in size although no definitive cystic or solid lesions seen. 09/17/2021: MRI of the pelvis shows 1.9 x 1.7 x 1.8 cm complex cystic mass in the central cervical stroma.  Cervical canal is not well-demonstrated but this lesion may be related to the canal or in the cervical stroma.  Could represent tiny cluster of multiple cyst or single multicystic lesion.  Large nabothian cyst would also be a consideration.  Cystic cervicitis is in the differential and adenoma malignancy cannot be excluded.  Second smaller nodular component along the anterior wall of the cervix is immediately anterior to the above lesion and measures up to 1.3 cm.  It generates some mass effect on the posterior wall of the urinary bladder, does not appear overtly aggressive and may simply represent  scar.  Unremarkable appearance of the ovaries.   10/15/21: Pap and ECC. Pap was NIML. ECC showed scant benign glandular and squamous tissue.   Received records from [facility] in [place].  Patient was admitted in December 2017 for surgery in the setting of abnormal uterine bleeding and severe dysmenorrhea.  She underwent supracervical hysterectomy, bilateral salpingectomy, and excision of abdominal wall mass via Pfannenstiel incision.  Findings at the time of  surgery included some bladder adhesions in the setting of her prior C-sections.  Posterior cul-de-sac was described as "clear".  Normal-appearing bilateral tubes and ovaries.  There is not mention of significant pelvic adhesive disease.  There is not mention of any endometriosis within the pelvis.  It is unclear to me by the operative report why a supracervical hysterectomy was performed.   The report shows proliferative endometrium, no hyperplasia or atypia.  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  Bilateral fallopian tubes without significant pathology.  Abdominal wall mass that had been excised showed benign fibrous tissue and skeletal muscle with fibrous and reactive changes.  No endometriosis, atypia or malignancy.  Interval History: Has noticed some benefit after starting norethindrone but over the last month has had worsening of her pelvic pain.  Describes this as low pelvic with radiation to bilateral sides.  She now endorses about a month of all day dull pain.  She denies any associated vaginal bleeding or discharge.  She endorses normal bowel function, denies any urinary symptoms.  Past Medical/Surgical History: Past Medical History:  Diagnosis Date   Anemia    Asthma    TID use currently 2.2024   Endometriosis    Tobacco abuse     Past Surgical History:  Procedure Laterality Date   BACK SURGERY     carpel tunnel     CESAREAN SECTION     x3   ENDOMETRIAL ABLATION     EYE SURGERY N/A    [facility] 11/30/2020   OTHER SURGICAL HISTORY  2013   removal of scar tissue (endometriosis) from Pfannenstiel incision   PARTIAL HYSTERECTOMY  2017   cervix, ovaries left in situ   SPINE SURGERY N/A    [facility] 11/30/2020   TUBAL LIGATION N/A    [facility] 11/30/2020, at time of last section    Family History  Problem Relation Age of Onset   Breast cancer Mother    Heart attack Mother    Hypertension Mother    Diabetes Sister    Breast cancer Maternal Grandmother    Dementia  Paternal Grandmother    Colon cancer Neg Hx    Esophageal cancer Neg Hx    Stomach cancer Neg Hx    Colon polyps Neg Hx    Pancreatic cancer Neg Hx     Social History   Socioeconomic History   Marital status: Married    Spouse name: Chemeka Yarbough   Number of children: 3   Years of education: Associates degree   Highest education level: Associate degree: occupational, Scientist, product/process development, or vocational program  Occupational History   Occupation: Clinical biochemist Rep    Comment: [employer]  Tobacco Use   Smoking status: Every Day    Packs/day: 0.50    Years: 25.00    Additional pack years: 0.00    Total pack years: 12.50    Types: Cigarettes    Start date: 12/03/2000   Smokeless tobacco: Never  Vaping Use   Vaping Use: Never used  Substance and Sexual Activity   Alcohol use: Yes  Alcohol/week: 2.0 standard drinks of alcohol    Types: 2 Glasses of wine per week    Comment: occasionally   Drug use: Never   Sexual activity: Yes    Partners: Male    Birth control/protection: Surgical    Comment: supracervical hysterectomy, menarche 42yo, sexual debut 42yo  Other Topics Concern   Not on file  Social History Narrative   Not on file   Social Determinants of Health   Financial Resource Strain: Not on file  Food Insecurity: Not on file  Transportation Needs: Not on file  Physical Activity: Not on file  Stress: Not on file  Social Connections: Not on file    Current Medications:  Current Outpatient Medications:    ibuprofen (ADVIL) 800 MG tablet, Take 1 tablet (800 mg total) by mouth every 8 (eight) hours as needed for moderate pain. For AFTER surgery only, Disp: 30 tablet, Rfl: 0   oxyCODONE (OXY IR/ROXICODONE) 5 MG immediate release tablet, Take 1 tablet (5 mg total) by mouth every 4 (four) hours as needed for severe pain. For AFTER surgery only, do not take and drive, Disp: 20 tablet, Rfl: 0   acetaminophen (TYLENOL) 500 MG tablet, Take 1,000 mg by mouth every 6 (six) hours as  needed for moderate pain., Disp: , Rfl:    albuterol (VENTOLIN HFA) 108 (90 Base) MCG/ACT inhaler, TAKE 2 PUFFS BY MOUTH EVERY 6 HOURS AS NEEDED FOR WHEEZE OR SHORTNESS OF BREATH, Disp: 18 g, Rfl: 2   Ascorbic Acid (VITAMIN C PO), Take 1 tablet by mouth daily., Disp: , Rfl:    b complex vitamins capsule, Take 1 capsule by mouth daily., Disp: , Rfl:    budesonide-formoterol (SYMBICORT) 160-4.5 MCG/ACT inhaler, Inhale 2 puffs into the lungs 2 (two) times daily. (Patient not taking: Reported on 02/28/2023), Disp: 3 each, Rfl: 1   cetirizine (ZYRTEC) 10 MG tablet, TAKE 1 TABLET BY MOUTH EVERY DAY, Disp: 90 tablet, Rfl: 1   cyclobenzaprine (FLEXERIL) 10 MG tablet, TAKE 1/2 TO 1 TABLET BY MOUTH DAILY AT BEDTIME AS NEEDED FOR MUSCLE PAIN, Disp: 30 tablet, Rfl: 2   fluticasone (FLONASE) 50 MCG/ACT nasal spray, SPRAY 2 SPRAYS INTO EACH NOSTRIL EVERY DAY (Patient taking differently: Place 2 sprays into both nostrils daily as needed for allergies.), Disp: 16 mL, Rfl: 3   meloxicam (MOBIC) 15 MG tablet, Take 15 mg by mouth daily., Disp: , Rfl:    montelukast (SINGULAIR) 10 MG tablet, TAKE 1 TABLET BY MOUTH EVERYDAY AT BEDTIME, Disp: 90 tablet, Rfl: 3   mupirocin ointment (BACTROBAN) 2 %, Apply 1 Application topically daily as needed (wound care)., Disp: , Rfl:    norethindrone (ORTHO MICRONOR) 0.35 MG tablet, Take 1 tablet (0.35 mg total) by mouth daily., Disp: 28 tablet, Rfl: 11   senna-docusate (SENOKOT-S) 8.6-50 MG tablet, Take 1 tablet by mouth at bedtime as needed for mild constipation or moderate constipation., Disp: 30 tablet, Rfl: 5  Review of Systems: Denies appetite changes, fevers, chills, fatigue, unexplained weight changes. Denies hearing loss, neck lumps or masses, mouth sores, ringing in ears or voice changes. Denies cough or wheezing.  Denies shortness of breath. Denies chest pain or palpitations. Denies leg swelling. Denies abdominal distention, blood in stools, constipation, diarrhea, nausea,  vomiting, or early satiety. Denies pain with intercourse, dysuria, frequency, hematuria or incontinence. Denies hot flashes, vaginal bleeding or vaginal discharge.   Denies joint pain, back pain or muscle pain/cramps. Denies itching, rash, or wounds. Denies dizziness, headaches, numbness or seizures.  Denies swollen lymph nodes or glands, denies easy bruising or bleeding. Denies anxiety, depression, confusion, or decreased concentration.  Physical Exam: BP (!) 142/87 (BP Location: Left Arm, Patient Position: Sitting)   Pulse 77   Temp 98.8 F (37.1 C) (Oral)   Wt 243 lb 12.8 oz (110.6 kg)   SpO2 99%   BMI 34.98 kg/m  General: Alert, oriented, no acute distress. HEENT: Normocephalic, atraumatic, sclera anicteric. Chest: Unlabored breathing on room air.  Laboratory & Radiologic Studies: 01/04/23: Uterus Surgically absent. A septated hypoechoic region is identified in the region of the vaginal cuff measuring 1.4 x 1.2 x 0.9 cm and another adjacent hypoechoic region measures 1.2 x 1.1 x 0.8 cm. No internal blood flow.   Right ovary Measurements: 1.9 x 1.5 x 1.8 cm = volume: 2.8 mL. Normal appearance/no adnexal mass.   Left ovary Measurements: 1.1 x 0.8 x 1.1 cm = volume: 0.5 mL. Normal appearance/no adnexal mass.   Other findings No abnormal free fluid.   IMPRESSION: 1. The 2 hypoechoic masses described above could represent nabothian cysts if the patient's hysterectomy was supracervical. The masses are indeterminate if the patient's cervix was removed. Recommend clinical correlation. 2. The ovaries are unremarkable.  Assessment & Plan: Kimberly Armstrong is a 42 y.o. woman with continued pain after supracervical hysterectomy.   Patient continues to have deep pelvic pain despite suppression with norethindrone.  Her pain is more constant now and seems localized to the deep pelvis/cervix.  Discussed recent pelvic ultrasound which shows findings most consistent with  nabothian cyst within her cervical stump.  Unremarkable ovaries.  Discussed findings again from her surgery as well as pathology.  There is no description of exam findings consistent with endometriosis and no endometriosis noted on her final pathology.  We discussed the possibility that surgery to remove her cervix may not change/improve her pain.  She voices understanding.  We also discussed that there is some scar tissue between her bladder and cervix, noted at the time of her supracervical hysterectomy.  There is likely been development of additional scar tissue after her hysterectomy.  Will reach out to [facility] urology about possibility of ICG injection into the ureters at the start of surgery.  Patient's white blood cell count was elevated earlier this week.  She tells me today that last weekend she was put on a prednisone taper for some left shoulder/back pain.  She has now finished steroids.  We will plan to recheck CBC the morning of surgery.  We discussed plan for her ovaries.  Without visual evidence or pathologic evidence of endometriosis at the time of her last surgery, I am hesitant to recommend removing bilateral ovaries for treatment of endometriosis.  We discussed the risk and benefits of leaving the ovaries versus taking the mall and putting her on hormone replacement.  Ultimately, as long as they are normal in appearance, even if I find evidence of endometriosis, she would like to keep her ovaries.  We reviewed the plan for robotic assisted trachelectomy, possible unilateral versus bilateral oophorectomy, possible laparotomy.  The patient and I discussed that even if there is evidence of endometriosis, as long as the ovaries look normal, preference is that I leave them in situ.  The risks of surgery were discussed in detail and she understands these to include infection; wound separation; hernia; vaginal cuff separation, injury to adjacent organs such as bowel, bladder, blood vessels,  ureters and nerves; bleeding which may require blood transfusion; anesthesia risk; thromboembolic events; possible  death; unforeseen complications; possible need for re-exploration; medical complications such as heart attack, stroke, pleural effusion and pneumonia. The patient will receive DVT and antibiotic prophylaxis as indicated. She voiced a clear understanding. She had the opportunity to ask questions. Perioperative instructions were reviewed with her. Prescriptions for post-op medications were sent to her pharmacy of choice.  22 minutes of total time was spent for this patient encounter, including preparation, face-to-face counseling with the patient and coordination of care, and documentation of the encounter.  Eugene Garnet, MD  Division of Gynecologic Oncology  Department of Obstetrics and Gynecology  [facility]

## 2023-03-07 ENCOUNTER — Ambulatory Visit: Payer: Medicaid Other

## 2023-03-07 NOTE — Progress Notes (Signed)
Patient here for follow up with Dr. Pricilla Holm and for a pre-operative appointment prior to her scheduled surgery on March 09, 2023. She is scheduled for robotic assisted laparoscopic trachelectomy.  The surgery was discussed in detail.  See after visit summary for additional details.    Discussed post-op pain management in detail including the aspects of the enhanced recovery pathway.  Advised her that a new prescription would be sent in for oxycodone and it is only to be used for after her upcoming surgery.  We discussed the use of tylenol post-op and to monitor for a maximum of 4,000 mg in a 24 hour period.  Also prescribed sennakot to be used after surgery and to hold if having loose stools.  Discussed bowel regimen in detail.     Discussed measures to take at home to prevent DVT including frequent mobility.  Reportable signs and symptoms of DVT discussed. Post-operative instructions discussed and expectations for after surgery. Incisional care discussed as well including reportable signs and symptoms including erythema, drainage, wound separation.     10 minutes spent with the patient/preparing information.  Verbalizing understanding of material discussed. No needs or concerns voiced at the end of the visit.   Advised patient to call for any needs.  Advised that her post-operative medications had been prescribed and could be picked up at any time.    This appointment is included in the global surgical bundle as pre-operative teaching and has no charge.

## 2023-03-07 NOTE — Telephone Encounter (Signed)
Checking to see if this has been completed so I can close this encounter in my box.

## 2023-03-08 ENCOUNTER — Telehealth: Payer: Self-pay | Admitting: *Deleted

## 2023-03-08 NOTE — Telephone Encounter (Signed)
Called Short Stay at [facility] and spoke with Myriam Jacobson and relayed message from [name], NP that patient Kimberly Armstrong will need a CBC drawn prior to her surgery on Wednesday morning July 10 th with Dr. Pricilla Holm.

## 2023-03-08 NOTE — Telephone Encounter (Signed)
Telephone call to check on pre-operative status.  Patient compliant with pre-operative instructions.  Reinforced nothing to eat after midnight. Clear liquids until 0550. Patient to arrive at 0650.  No questions or concerns voiced.  Instructed to call for any needs. 

## 2023-03-08 NOTE — Telephone Encounter (Signed)
Attempted to reach patient for pre-op call. Left voicemail requesting call back.

## 2023-03-09 ENCOUNTER — Other Ambulatory Visit: Payer: Self-pay | Admitting: Gynecologic Oncology

## 2023-03-09 ENCOUNTER — Ambulatory Visit (HOSPITAL_COMMUNITY): Payer: Medicaid Other | Admitting: Anesthesiology

## 2023-03-09 ENCOUNTER — Other Ambulatory Visit: Payer: Self-pay

## 2023-03-09 ENCOUNTER — Encounter (HOSPITAL_COMMUNITY): Payer: Self-pay | Admitting: Gynecologic Oncology

## 2023-03-09 ENCOUNTER — Ambulatory Visit (HOSPITAL_BASED_OUTPATIENT_CLINIC_OR_DEPARTMENT_OTHER): Payer: Medicaid Other | Admitting: Anesthesiology

## 2023-03-09 ENCOUNTER — Ambulatory Visit (HOSPITAL_COMMUNITY)
Admission: RE | Admit: 2023-03-09 | Discharge: 2023-03-10 | Disposition: A | Discharge status: Home / Self Care | Payer: Medicaid Other | Attending: Gynecologic Oncology | Admitting: Gynecologic Oncology

## 2023-03-09 ENCOUNTER — Encounter (HOSPITAL_COMMUNITY)
Admission: RE | Disposition: A | Discharge status: Home / Self Care | Payer: Self-pay | Source: Home / Self Care | Attending: Gynecologic Oncology

## 2023-03-09 DIAGNOSIS — G8929 Other chronic pain: Secondary | ICD-10-CM | POA: Insufficient documentation

## 2023-03-09 DIAGNOSIS — N889 Noninflammatory disorder of cervix uteri, unspecified: Secondary | ICD-10-CM

## 2023-03-09 DIAGNOSIS — N83202 Unspecified ovarian cyst, left side: Secondary | ICD-10-CM

## 2023-03-09 DIAGNOSIS — N888 Other specified noninflammatory disorders of cervix uteri: Secondary | ICD-10-CM

## 2023-03-09 DIAGNOSIS — Z9851 Tubal ligation status: Secondary | ICD-10-CM | POA: Diagnosis not present

## 2023-03-09 DIAGNOSIS — J45909 Unspecified asthma, uncomplicated: Secondary | ICD-10-CM | POA: Insufficient documentation

## 2023-03-09 DIAGNOSIS — Z79899 Other long term (current) drug therapy: Secondary | ICD-10-CM | POA: Diagnosis not present

## 2023-03-09 DIAGNOSIS — Z7951 Long term (current) use of inhaled steroids: Secondary | ICD-10-CM | POA: Diagnosis not present

## 2023-03-09 DIAGNOSIS — Z79890 Hormone replacement therapy (postmenopausal): Secondary | ICD-10-CM | POA: Insufficient documentation

## 2023-03-09 DIAGNOSIS — N72 Inflammatory disease of cervix uteri: Secondary | ICD-10-CM | POA: Insufficient documentation

## 2023-03-09 DIAGNOSIS — Z9889 Other specified postprocedural states: Secondary | ICD-10-CM

## 2023-03-09 DIAGNOSIS — D72829 Elevated white blood cell count, unspecified: Secondary | ICD-10-CM

## 2023-03-09 DIAGNOSIS — F17210 Nicotine dependence, cigarettes, uncomplicated: Secondary | ICD-10-CM | POA: Insufficient documentation

## 2023-03-09 DIAGNOSIS — G8918 Other acute postprocedural pain: Secondary | ICD-10-CM

## 2023-03-09 DIAGNOSIS — D487 Neoplasm of uncertain behavior of other specified sites: Secondary | ICD-10-CM

## 2023-03-09 DIAGNOSIS — R102 Pelvic and perineal pain: Secondary | ICD-10-CM | POA: Diagnosis not present

## 2023-03-09 DIAGNOSIS — R3989 Other symptoms and signs involving the genitourinary system: Secondary | ICD-10-CM

## 2023-03-09 HISTORY — PX: ROBOTIC ASSISTED LAPAROSCOPIC OVARIAN CYSTECTOMY: SHX6081

## 2023-03-09 HISTORY — PX: ROBOT ASSISTED TRACHELECTOMY: SHX6728

## 2023-03-09 LAB — TYPE AND SCREEN

## 2023-03-09 LAB — ABO/RH: ABO/RH(D): A POS

## 2023-03-09 SURGERY — XI ROBOTIC ASSISTED TRACHELECTOMY
Anesthesia: General

## 2023-03-09 MED ORDER — GABAPENTIN 300 MG PO CAPS
ORAL_CAPSULE | ORAL | Status: AC
  Filled 2023-03-09: qty 1

## 2023-03-09 MED ORDER — ACETAMINOPHEN 500 MG PO TABS
1000.0000 mg | ORAL_TABLET | Freq: Four times a day (QID) | ORAL | Status: DC
  Administered 2023-03-09 (×2): 1000 mg via ORAL
  Filled 2023-03-09 (×2): qty 2

## 2023-03-09 MED ORDER — DEXAMETHASONE SODIUM PHOSPHATE 4 MG/ML IJ SOLN: 4.0000 mg | INTRAMUSCULAR | Status: DC

## 2023-03-09 MED ORDER — CEFAZOLIN SODIUM-DEXTROSE 2-4 GM/100ML-% IV SOLN
2.0000 g | INTRAVENOUS | Status: AC
  Administered 2023-03-09: 2 g via INTRAVENOUS

## 2023-03-09 MED ORDER — ACETAMINOPHEN 500 MG PO TABS
1000.0000 mg | ORAL_TABLET | ORAL | Status: AC
  Administered 2023-03-09: 1000 mg via ORAL

## 2023-03-09 MED ORDER — HYDROMORPHONE HCL 1 MG/ML IJ SOLN
0.5000 mg | INTRAMUSCULAR | Status: DC | PRN
  Administered 2023-03-09: 0.5 mg via INTRAVENOUS

## 2023-03-09 MED ORDER — ONDANSETRON HCL 4 MG/2ML IJ SOLN
INTRAMUSCULAR | Status: AC
  Filled 2023-03-09: qty 2

## 2023-03-09 MED ORDER — STERILE WATER FOR IRRIGATION IR SOLN
Status: DC | PRN
  Administered 2023-03-09: 2000 mL

## 2023-03-09 MED ORDER — FENTANYL CITRATE PF 50 MCG/ML IJ SOSY
25.0000 ug | PREFILLED_SYRINGE | INTRAMUSCULAR | Status: DC | PRN
  Administered 2023-03-09 (×2): 50 ug via INTRAVENOUS

## 2023-03-09 MED ORDER — ONDANSETRON HCL 4 MG PO TABS: 4.0000 mg | ORAL_TABLET | Freq: Four times a day (QID) | ORAL | Status: DC | PRN

## 2023-03-09 MED ORDER — PHENAZOPYRIDINE HCL 100 MG PO TABS
100.0000 mg | ORAL_TABLET | Freq: Three times a day (TID) | ORAL | Status: DC
  Administered 2023-03-10: 100 mg via ORAL
  Filled 2023-03-09: qty 1

## 2023-03-09 MED ORDER — SCOPOLAMINE 1 MG/3DAYS TD PT72
1.0000 | MEDICATED_PATCH | TRANSDERMAL | Status: DC
  Administered 2023-03-09: 1.5 mg via TRANSDERMAL

## 2023-03-09 MED ORDER — DEXAMETHASONE SODIUM PHOSPHATE 10 MG/ML IJ SOLN
INTRAMUSCULAR | Status: AC
  Filled 2023-03-09: qty 1

## 2023-03-09 MED ORDER — SODIUM CHLORIDE 0.9 % IR SOLN
Status: DC | PRN
  Administered 2023-03-09: 1000 mL via INTRAVESICAL

## 2023-03-09 MED ORDER — KCL IN DEXTROSE-NACL 10-5-0.45 MEQ/L-%-% IV SOLN
INTRAVENOUS | Status: DC
  Filled 2023-03-09: qty 1000

## 2023-03-09 MED ORDER — INDOCYANINE GREEN 25 MG IV SOLR
INTRAVENOUS | Status: DC | PRN
  Administered 2023-03-09: 25 mg

## 2023-03-09 MED ORDER — HEPARIN SODIUM (PORCINE) 5000 UNIT/ML IJ SOLN
INTRAMUSCULAR | Status: AC
  Filled 2023-03-09: qty 1

## 2023-03-09 MED ORDER — KETOROLAC TROMETHAMINE 15 MG/ML IJ SOLN
15.0000 mg | Freq: Three times a day (TID) | INTRAMUSCULAR | Status: DC
  Administered 2023-03-09 – 2023-03-10 (×2): 15 mg via INTRAVENOUS
  Filled 2023-03-09 (×2): qty 1

## 2023-03-09 MED ORDER — OXYCODONE HCL 5 MG/5ML PO SOLN: 5.0000 mg | Freq: Once | ORAL | Status: AC | PRN

## 2023-03-09 MED ORDER — FENTANYL CITRATE (PF) 100 MCG/2ML IJ SOLN
INTRAMUSCULAR | Status: AC
  Filled 2023-03-09: qty 2

## 2023-03-09 MED ORDER — BUPIVACAINE HCL 0.25 % IJ SOLN
INTRAMUSCULAR | Status: AC
  Filled 2023-03-09: qty 1

## 2023-03-09 MED ORDER — ONDANSETRON HCL 4 MG/2ML IJ SOLN
INTRAMUSCULAR | Status: DC | PRN
  Administered 2023-03-09: 4 mg via INTRAVENOUS

## 2023-03-09 MED ORDER — OXYCODONE HCL 5 MG PO TABS
5.0000 mg | ORAL_TABLET | ORAL | Status: DC | PRN
  Administered 2023-03-09: 5 mg via ORAL

## 2023-03-09 MED ORDER — HEPARIN SODIUM (PORCINE) 5000 UNIT/ML IJ SOLN
5000.0000 [IU] | INTRAMUSCULAR | Status: AC
  Administered 2023-03-09: 5000 [IU] via SUBCUTANEOUS

## 2023-03-09 MED ORDER — PROPOFOL 10 MG/ML IV BOLUS
INTRAVENOUS | Status: DC | PRN
  Administered 2023-03-09: 190 mg via INTRAVENOUS

## 2023-03-09 MED ORDER — MOMETASONE FURO-FORMOTEROL FUM 200-5 MCG/ACT IN AERO: 2.0000 | INHALATION_SPRAY | Freq: Two times a day (BID) | RESPIRATORY_TRACT | Status: DC

## 2023-03-09 MED ORDER — ROCURONIUM BROMIDE 10 MG/ML (PF) SYRINGE
PREFILLED_SYRINGE | INTRAVENOUS | Status: DC | PRN
  Administered 2023-03-09: 10 mg via INTRAVENOUS
  Administered 2023-03-09: 30 mg via INTRAVENOUS
  Administered 2023-03-09: 20 mg via INTRAVENOUS
  Administered 2023-03-09: 50 mg via INTRAVENOUS
  Administered 2023-03-09: 10 mg via INTRAVENOUS

## 2023-03-09 MED ORDER — LACTATED RINGERS IR SOLN
Status: DC | PRN
  Administered 2023-03-09: 1000 mL

## 2023-03-09 MED ORDER — MIDAZOLAM HCL 2 MG/2ML IJ SOLN
INTRAMUSCULAR | Status: DC | PRN
  Administered 2023-03-09: 2 mg via INTRAVENOUS

## 2023-03-09 MED ORDER — ACETAMINOPHEN 500 MG PO TABS
ORAL_TABLET | ORAL | Status: AC
  Filled 2023-03-09: qty 2

## 2023-03-09 MED ORDER — ONDANSETRON HCL 4 MG/2ML IJ SOLN: 4.0000 mg | Freq: Four times a day (QID) | INTRAMUSCULAR | Status: DC | PRN

## 2023-03-09 MED ORDER — ENOXAPARIN SODIUM 40 MG/0.4ML IJ SOSY: 40.0000 mg | PREFILLED_SYRINGE | INTRAMUSCULAR | Status: DC

## 2023-03-09 MED ORDER — SUGAMMADEX SODIUM 200 MG/2ML IV SOLN
INTRAVENOUS | Status: DC | PRN
  Administered 2023-03-09: 230 mg via INTRAVENOUS

## 2023-03-09 MED ORDER — FLUTICASONE PROPIONATE 50 MCG/ACT NA SUSP: 2.0000 | Freq: Every day | NASAL | Status: DC | PRN

## 2023-03-09 MED ORDER — ROCURONIUM BROMIDE 10 MG/ML (PF) SYRINGE
PREFILLED_SYRINGE | INTRAVENOUS | Status: AC
  Filled 2023-03-09: qty 10

## 2023-03-09 MED ORDER — FENTANYL CITRATE PF 50 MCG/ML IJ SOSY
PREFILLED_SYRINGE | INTRAMUSCULAR | Status: AC
  Filled 2023-03-09: qty 3

## 2023-03-09 MED ORDER — SCOPOLAMINE 1 MG/3DAYS TD PT72
MEDICATED_PATCH | TRANSDERMAL | Status: AC
  Filled 2023-03-09: qty 1

## 2023-03-09 MED ORDER — OXYCODONE HCL 5 MG PO TABS
5.0000 mg | ORAL_TABLET | Freq: Once | ORAL | Status: AC | PRN
  Administered 2023-03-09: 5 mg via ORAL

## 2023-03-09 MED ORDER — ORAL CARE MOUTH RINSE: 15.0000 mL | Freq: Once | OROMUCOSAL | Status: AC

## 2023-03-09 MED ORDER — KETOROLAC TROMETHAMINE 15 MG/ML IJ SOLN: 15.0000 mg | INTRAMUSCULAR | Status: DC

## 2023-03-09 MED ORDER — TRAMADOL HCL 50 MG PO TABS
50.0000 mg | ORAL_TABLET | Freq: Four times a day (QID) | ORAL | Status: DC | PRN
  Administered 2023-03-10: 50 mg via ORAL
  Filled 2023-03-09: qty 1

## 2023-03-09 MED ORDER — FENTANYL CITRATE (PF) 100 MCG/2ML IJ SOLN
INTRAMUSCULAR | Status: DC | PRN
  Administered 2023-03-09: 50 ug via INTRAVENOUS
  Administered 2023-03-09: 100 ug via INTRAVENOUS
  Administered 2023-03-09 (×3): 50 ug via INTRAVENOUS

## 2023-03-09 MED ORDER — CHLORHEXIDINE GLUCONATE 0.12 % MT SOLN
15.0000 mL | Freq: Once | OROMUCOSAL | Status: AC
  Administered 2023-03-09: 15 mL via OROMUCOSAL

## 2023-03-09 MED ORDER — STERILE WATER FOR INJECTION IJ SOLN
INTRAMUSCULAR | Status: DC | PRN
  Administered 2023-03-09: 10 mL

## 2023-03-09 MED ORDER — OXYCODONE HCL 5 MG PO TABS
ORAL_TABLET | ORAL | Status: AC
  Filled 2023-03-09: qty 1

## 2023-03-09 MED ORDER — LIDOCAINE HCL (PF) 2 % IJ SOLN
INTRAMUSCULAR | Status: AC
  Filled 2023-03-09: qty 5

## 2023-03-09 MED ORDER — LACTATED RINGERS IV SOLN: INTRAVENOUS | Status: DC | PRN

## 2023-03-09 MED ORDER — GABAPENTIN 300 MG PO CAPS
300.0000 mg | ORAL_CAPSULE | ORAL | Status: AC
  Administered 2023-03-09: 300 mg via ORAL

## 2023-03-09 MED ORDER — PROPOFOL 10 MG/ML IV BOLUS
INTRAVENOUS | Status: AC
  Filled 2023-03-09: qty 20

## 2023-03-09 MED ORDER — TRAMADOL HCL 50 MG PO TABS: 50.0000 mg | ORAL_TABLET | Freq: Four times a day (QID) | ORAL | Status: DC | PRN

## 2023-03-09 MED ORDER — HYDROMORPHONE HCL 1 MG/ML IJ SOLN
INTRAMUSCULAR | Status: AC
  Filled 2023-03-09: qty 1

## 2023-03-09 MED ORDER — LIDOCAINE 2% (20 MG/ML) 5 ML SYRINGE
INTRAMUSCULAR | Status: DC | PRN
  Administered 2023-03-09: 50 mg via INTRAVENOUS

## 2023-03-09 MED ORDER — CEFAZOLIN SODIUM-DEXTROSE 2-4 GM/100ML-% IV SOLN
INTRAVENOUS | Status: AC
  Filled 2023-03-09: qty 100

## 2023-03-09 MED ORDER — DEXAMETHASONE SODIUM PHOSPHATE 10 MG/ML IJ SOLN
INTRAMUSCULAR | Status: DC | PRN
  Administered 2023-03-09: 4 mg via INTRAVENOUS

## 2023-03-09 MED ORDER — SENNOSIDES-DOCUSATE SODIUM 8.6-50 MG PO TABS
2.0000 | ORAL_TABLET | Freq: Every day | ORAL | Status: DC
  Administered 2023-03-09: 2 via ORAL
  Filled 2023-03-09: qty 2

## 2023-03-09 MED ORDER — STERILE WATER FOR INJECTION IJ SOLN
INTRAMUSCULAR | Status: AC
  Filled 2023-03-09: qty 20

## 2023-03-09 MED ORDER — BUPIVACAINE HCL 0.25 % IJ SOLN
INTRAMUSCULAR | Status: DC | PRN
  Administered 2023-03-09: 33 mL

## 2023-03-09 MED ORDER — ALBUTEROL SULFATE (2.5 MG/3ML) 0.083% IN NEBU: 2.5000 mg | INHALATION_SOLUTION | Freq: Four times a day (QID) | RESPIRATORY_TRACT | Status: DC | PRN

## 2023-03-09 MED ORDER — LORATADINE 10 MG PO TABS: 10.0000 mg | ORAL_TABLET | Freq: Every day | ORAL | Status: DC

## 2023-03-09 MED ORDER — LACTATED RINGERS IV SOLN
INTRAVENOUS | Status: DC
  Administered 2023-03-09: 1000 mL via INTRAVENOUS

## 2023-03-09 MED ORDER — MIDAZOLAM HCL 2 MG/2ML IJ SOLN
INTRAMUSCULAR | Status: AC
  Filled 2023-03-09: qty 2

## 2023-03-09 SURGICAL SUPPLY — 91 items
ADAPTER GOLDBERG URETERAL (ADAPTER) IMPLANT
ADH SKN CLS APL DERMABOND .7 (GAUZE/BANDAGES/DRESSINGS) ×1
ADPR CATH 15X14FR FL DRN BG (ADAPTER)
AGENT HMST KT MTR STRL THRMB (HEMOSTASIS)
APL ESCP 34 STRL LF DISP (HEMOSTASIS)
APPLICATOR SURGIFLO ENDO (HEMOSTASIS) IMPLANT
BAG COUNTER SPONGE SURGICOUNT (BAG) IMPLANT
BAG LAPAROSCOPIC 12 15 PORT 16 (BASKET) IMPLANT
BAG RETRIEVAL 12/15 (BASKET)
BAG SPNG CNTER NS LX DISP (BAG)
BAG URO CATCHER STRL LF (MISCELLANEOUS) ×1 IMPLANT
BLADE SURG SZ10 CARB STEEL (BLADE) IMPLANT
CATH URETL OPEN 5X70 (CATHETERS) IMPLANT
CLOTH BEACON ORANGE TIMEOUT ST (SAFETY) ×1 IMPLANT
COVER BACK TABLE 60X90IN (DRAPES) ×1 IMPLANT
COVER TIP SHEARS 8 DVNC (MISCELLANEOUS) ×1 IMPLANT
DERMABOND ADVANCED .7 DNX12 (GAUZE/BANDAGES/DRESSINGS) ×1 IMPLANT
DRAPE ARM DVNC X/XI (DISPOSABLE) ×4 IMPLANT
DRAPE COLUMN DVNC XI (DISPOSABLE) ×1 IMPLANT
DRAPE SHEET LG 3/4 BI-LAMINATE (DRAPES) ×1 IMPLANT
DRAPE SURG IRRIG POUCH 19X23 (DRAPES) ×1 IMPLANT
DRIVER NDL MEGA SUTCUT DVNCXI (INSTRUMENTS) ×1 IMPLANT
DRIVER NDLE MEGA SUTCUT DVNCXI (INSTRUMENTS) ×1 IMPLANT
DRSG OPSITE POSTOP 4X6 (GAUZE/BANDAGES/DRESSINGS) IMPLANT
DRSG OPSITE POSTOP 4X8 (GAUZE/BANDAGES/DRESSINGS) IMPLANT
ELECT PENCIL ROCKER SW 15FT (MISCELLANEOUS) IMPLANT
ELECT REM PT RETURN 15FT ADLT (MISCELLANEOUS) ×1 IMPLANT
FORCEPS BPLR FENES DVNC XI (FORCEP) ×1 IMPLANT
FORCEPS PROGRASP DVNC XI (FORCEP) ×1 IMPLANT
GAUZE 4X4 16PLY ~~LOC~~+RFID DBL (SPONGE) ×2 IMPLANT
GLOVE BIO SURGEON STRL SZ 6 (GLOVE) ×4 IMPLANT
GLOVE BIO SURGEON STRL SZ 6.5 (GLOVE) ×1 IMPLANT
GLOVE SURG LX STRL 7.5 STRW (GLOVE) ×1 IMPLANT
GOWN STRL REUS W/ TWL LRG LVL3 (GOWN DISPOSABLE) ×5 IMPLANT
GOWN STRL REUS W/TWL LRG LVL3 (GOWN DISPOSABLE) ×5
GRASPER SUT TROCAR 14GX15 (MISCELLANEOUS) IMPLANT
GUIDEWIRE ANG ZIPWIRE 038X150 (WIRE) IMPLANT
GUIDEWIRE STR DUAL SENSOR (WIRE) IMPLANT
HOLDER FOLEY CATH W/STRAP (MISCELLANEOUS) IMPLANT
IRRIG SUCT STRYKERFLOW 2 WTIP (MISCELLANEOUS) ×1
IRRIGATION SUCT STRKRFLW 2 WTP (MISCELLANEOUS) ×1 IMPLANT
KIT PROCEDURE DVNC SI (MISCELLANEOUS) ×1 IMPLANT
KIT TURNOVER KIT A (KITS) IMPLANT
LIGASURE IMPACT 36 18CM CVD LR (INSTRUMENTS) IMPLANT
MANIFOLD NEPTUNE II (INSTRUMENTS) ×1 IMPLANT
MANIPULATOR ADVINCU DEL 3.0 PL (MISCELLANEOUS) IMPLANT
MANIPULATOR ADVINCU DEL 3.5 PL (MISCELLANEOUS) IMPLANT
MANIPULATOR UTERINE 4.5 ZUMI (MISCELLANEOUS) IMPLANT
NDL HYPO 21X1.5 SAFETY (NEEDLE) ×1 IMPLANT
NDL SPNL 18GX3.5 QUINCKE PK (NEEDLE) IMPLANT
NEEDLE HYPO 21X1.5 SAFETY (NEEDLE) ×1 IMPLANT
NEEDLE SPNL 18GX3.5 QUINCKE PK (NEEDLE) IMPLANT
OBTURATOR OPTICAL STND 8 DVNC (TROCAR) ×1
OBTURATOR OPTICALSTD 8 DVNC (TROCAR) ×1 IMPLANT
PACK CYSTO (CUSTOM PROCEDURE TRAY) ×1 IMPLANT
PACK ROBOT GYN CUSTOM WL (TRAY / TRAY PROCEDURE) ×1 IMPLANT
PAD POSITIONING PINK XL (MISCELLANEOUS) ×1 IMPLANT
PORT ACCESS TROCAR AIRSEAL 12 (TROCAR) IMPLANT
SCISSORS MNPLR CVD DVNC XI (INSTRUMENTS) ×1 IMPLANT
SCRUB CHG 4% DYNA-HEX 4OZ (MISCELLANEOUS) IMPLANT
SEAL UNIV 5-12 XI (MISCELLANEOUS) ×4 IMPLANT
SET TRI-LUMEN FLTR TB AIRSEAL (TUBING) ×1 IMPLANT
SPIKE FLUID TRANSFER (MISCELLANEOUS) ×1 IMPLANT
SPONGE T-LAP 18X18 ~~LOC~~+RFID (SPONGE) IMPLANT
SURGIFLO W/THROMBIN 8M KIT (HEMOSTASIS) IMPLANT
SUT MNCRL AB 4-0 PS2 18 (SUTURE) IMPLANT
SUT PDS AB 1 TP1 96 (SUTURE) IMPLANT
SUT V-LOC 180 0-0 GS22 (SUTURE) IMPLANT
SUT VIC AB 0 CT1 27 (SUTURE)
SUT VIC AB 0 CT1 27XBRD ANTBC (SUTURE) IMPLANT
SUT VIC AB 2-0 CT1 27 (SUTURE)
SUT VIC AB 2-0 CT1 TAPERPNT 27 (SUTURE) IMPLANT
SUT VIC AB 2-0 SH 27 (SUTURE) ×2
SUT VIC AB 2-0 SH 27X BRD (SUTURE) IMPLANT
SUT VIC AB 4-0 PS2 18 (SUTURE) ×2 IMPLANT
SUT VICRYL 0 27 CT2 27 ABS (SUTURE) ×1 IMPLANT
SUT VLOC 180 0 9IN GS21 (SUTURE) IMPLANT
SYR 10ML LL (SYRINGE) IMPLANT
SYS BAG RETRIEVAL 10MM (BASKET) ×1
SYS WOUND ALEXIS 18CM MED (MISCELLANEOUS)
SYSTEM BAG RETRIEVAL 10MM (BASKET) IMPLANT
SYSTEM WOUND ALEXIS 18CM MED (MISCELLANEOUS) IMPLANT
TOWEL OR NON WOVEN STRL DISP B (DISPOSABLE) IMPLANT
TRAP SPECIMEN MUCUS 40CC (MISCELLANEOUS) IMPLANT
TRAY FOLEY MTR SLVR 16FR STAT (SET/KITS/TRAYS/PACK) ×1 IMPLANT
TROCAR PORT AIRSEAL 5X120 (TROCAR) IMPLANT
TUBING CONNECTING 10 (TUBING) ×1 IMPLANT
TUBING UROLOGY SET (TUBING) IMPLANT
UNDERPAD 30X36 HEAVY ABSORB (UNDERPADS AND DIAPERS) ×2 IMPLANT
WATER STERILE IRR 1000ML POUR (IV SOLUTION) ×1 IMPLANT
YANKAUER SUCT BULB TIP 10FT TU (MISCELLANEOUS) IMPLANT

## 2023-03-09 NOTE — Discharge Instructions (Addendum)
AFTER SURGERY INSTRUCTIONS   Return to work: 4-6 weeks if applicable  You are being sent home with a foley catheter in place. Your cervix was adhered to your bladder and with removal of the cervix from this surface, a hole formed in the bladder. This was repaired at the time of surgery. The catheter in place will assist with healing of the bladder. We will plan on you having a scan around 10 days from surgery to evaluate the bladder. If healed with no leaking from the repaired area, we will plan on removing the catheter in the radiology department or in the office.  We will give you a call with the date and time of your scan (cystogram).  The catheter flows by gravity so if you are in bed or in a chair, have the foley bag lower than you for example resting on the floor so the urine can flow by gravity.    Activity: 1. Be up and out of the bed during the day.  Take a nap if needed.  You may walk up steps but be careful and use the hand rail.  Stair climbing will tire you more than you think, you may need to stop part way and rest.    2. No lifting or straining for 6 weeks over 10 pounds. No pushing, pulling, straining for 6 weeks.   3. No driving for around 1 week(s).  Do not drive if you are taking narcotic pain medicine and make sure that your reaction time has returned.    4. You can shower as soon as the next day after surgery. Shower daily.  Use your regular soap and water (not directly on the incision) and pat your incision(s) dry afterwards; don't rub.  No tub baths or submerging your body in water until cleared by your surgeon. If you have the soap that was given to you by pre-surgical testing that was used before surgery, you do not need to use it afterwards because this can irritate your incisions.    5. No sexual activity and nothing in the vagina for 10-12 weeks.   6. You may experience a small amount of clear drainage from your incisions, which is normal.  If the drainage persists,  increases, or changes color please call the office.   7. Do not use creams, lotions, or ointments such as neosporin on your incisions after surgery until advised by your surgeon because they can cause removal of the dermabond glue on your incisions.     8. You may experience vaginal spotting after surgery or when the stitches at the top of the vagina begin to dissolve.  The spotting is normal but if you experience heavy bleeding, call our office.   9. Take Tylenol or ibuprofen (OR MOBIC (MELOXICAM), DO NOT TAKE TOGETHER) first for pain if you are able to take these medications and only use narcotic pain medication for severe pain not relieved by the Tylenol or Ibuprofen.  Monitor your Tylenol intake to a max of 4,000 mg in a 24 hour period. You can alternate these medications after surgery.   Diet: 1. Low sodium Heart Healthy Diet is recommended but you are cleared to resume your normal (before surgery) diet after your procedure.   2. It is safe to use a laxative, such as Miralax or Colace, if you have difficulty moving your bowels. You can use Sennakot-S to take at bedtime every evening after surgery to keep bowel movements regular and to prevent constipation.  Wound Care: 1. Keep clean and dry.  Shower daily.   Reasons to call the Doctor: Fever - Oral temperature greater than 100.4 degrees Fahrenheit Foul-smelling vaginal discharge Difficulty urinating Nausea and vomiting Increased pain at the site of the incision that is unrelieved with pain medicine. Difficulty breathing with or without chest pain New calf pain especially if only on one side Sudden, continuing increased vaginal bleeding with or without clots.   Contacts: For questions or concerns you should contact:   Dr. Eugene Garnet at [phone]   [name], NP at [phone]   After Hours: call [phone] and have the GYN Oncologist paged/contacted (after 5 pm or on the weekends). You will speak with an after  hours RN and let he or she know you have had surgery.   Messages sent via mychart are for non-urgent matters and are not responded to after hours so for urgent needs, please call the after hours number.

## 2023-03-09 NOTE — Anesthesia Procedure Notes (Signed)
Procedure Name: Intubation Date/Time: 03/09/2023 9:15 AM  Performed by: Nelle Don, CRNAPre-anesthesia Checklist: Patient identified, Emergency Drugs available, Suction available and Patient being monitored Patient Re-evaluated:Patient Re-evaluated prior to induction Oxygen Delivery Method: Circle system utilized Preoxygenation: Pre-oxygenation with 100% oxygen Induction Type: IV induction Ventilation: Mask ventilation without difficulty Laryngoscope Size: Mac and 4 Grade View: Grade I Tube type: Oral Tube size: 7.0 mm Number of attempts: 1 Airway Equipment and Method: Stylet Placement Confirmation: ETT inserted through vocal cords under direct vision, positive ETCO2 and breath sounds checked- equal and bilateral Secured at: 22 cm Tube secured with: Tape Dental Injury: Teeth and Oropharynx as per pre-operative assessment

## 2023-03-09 NOTE — Transfer of Care (Signed)
Immediate Anesthesia Transfer of Care Note  Patient: Kimberly Armstrong  Procedure(s) Performed: XI ROBOTIC ASSISTED TRACHELECTOMY, ovarian cystectomy INDOCYANINE GREEN FLUORESCENCE IMAGING (ICG)  Patient Location: PACU  Anesthesia Type:General  Level of Consciousness: awake, alert , and oriented  Airway & Oxygen Therapy: Patient Spontanous Breathing and Patient connected to face mask oxygen  Post-op Assessment: Report given to RN, Post -op Vital signs reviewed and stable, and Patient moving all extremities X 4  Post vital signs: Reviewed and stable  Last Vitals:  Vitals Value Taken Time  BP 179/101 03/09/23 1249  Temp    Pulse 91 03/09/23 1251  Resp 21 03/09/23 1251  SpO2 100 % 03/09/23 1251  Vitals shown include unvalidated device data.  Last Pain:  Vitals:   03/09/23 0710  TempSrc:   PainSc: 6       Patients Stated Pain Goal: 3 (03/09/23 0710)  Complications: No notable events documented.

## 2023-03-09 NOTE — Op Note (Addendum)
OPERATIVE NOTE  Pre-operative Diagnosis: pelvic pain, prior supracervical hysterectomy  Post-operative Diagnosis: no evidence of endometriosis, small left ovarian cysts (< 2cm), dense adhesions between the bladder and cervix.  Operation: Robotic-assisted trachelectomy, lysis of adhesions, unavoidable cystotomy and primary repair, cystoscopy, left ovarian cystectomies x2 Cystoscopy with ureteral ICG injection (Dr. Liliane Shi)  Surgeon: Eugene Garnet MD  Assistant Surgeon: [name] NP  Anesthesia: GET  Urine Output: 300 cc  Operative Findings: On EUA, normal cervix.  On intra-abdominal entry, normal upper abdominal survey.  Normal small and large bowel.  Omentum with some filmy adhesions to the left pelvic sidewall and left aspect of the bladder.  Bilateral ovaries adherent to the pelvic sidewalls, she was taken.  Left ovary with 2 cysts 1 approximately 1 cm and the other 2 cm.  Both simple in appearance.  Uterus surgically absent.  Cervix not initially visible secondary to bladder adherent over the cervix.  Only the left ureter was noted to have ICG in infrared.  The bladder was quite adherent to the cervix superiorly and anteriorly.  Bladder was backfilled to help identify the contours of the bladder.  Ultimately, the cervix was transected in an effort to mobilize the bladder without injuring it.  During removal of the portion of cervix adherent to the bladder itself, there was unavoidable cystotomy along the midline cephalad to the trigone and ureteral orifices.  This was repaired in multiple layers.  Repair was watertight and cystoscopy at the end of the procedure noted no suture in the bladder and repair more than 1 cm from each ureteral orifice.  Good efflux noted from bilateral ureteral orifices.  The cervix itself had multiple nabothian cysts.  It was sent for frozen section with nothing to freeze gross examination.  Estimated Blood Loss:  50 cc      Total IV Fluids: see I&O  flowsheet         Specimens: cervical stump, left ovarian cysts         Complications:  None apparent; patient tolerated the procedure well.         Disposition: PACU - hemodynamically stable.  Procedure Details  The patient was seen in the Holding Room. The risks, benefits, complications, treatment options, and expected outcomes were discussed with the patient.  The patient concurred with the proposed plan, giving informed consent.  The site of surgery properly noted/marked. The patient was identified as Kimberly Armstrong and the procedure verified as a Robotic-assisted trachelectomy.   After induction of anesthesia, the patient was draped and prepped in the usual sterile manner. Patient was placed in supine position after anesthesia and draped and prepped in the usual sterile manner as follows: Her arms were tucked to her side with all appropriate precautions.  The patient was secured to the bed using padding and tape across her chest.  The patient was placed in the semi-lithotomy position in Wright stirrups.  The perineum and vagina were prepped with Betadine. The patient's abdomen was prepped with ChloraPrep and she was draped after the prep had been allowed to dry for 3 minutes.  A Time Out was held and the above information confirmed.  Dr. Liliane Shi first performed cystoscopy with ICG injection.  OG tube placement was confirmed and to suction.  A medium KOH ring was placed within the vagina with 2 ring forceps attached to help with upward traction.  Next, a 5 mm skin incision was made 1 cm below the subcostal margin in the midclavicular line.  The 5 mm  Optiview port and scope was used for direct entry.  Opening pressure was under 10 mm CO2.  The abdomen was insufflated and the findings were noted as above.   At this point and all points during the procedure, the patient's intra-abdominal pressure did not exceed 15 mmHg. Next, an 8 mm skin incision was made superior to the umbilicus and a right and  left port were placed about 8 cm lateral to the robot port on the right and left side.  A fourth arm was placed on the right.  The 5 mm assist trocar was exchanged for a 10-12 mm port. All ports were placed under direct visualization.  The patient was placed in steep Trendelenburg. The robot was docked in the normal manner.  Omental adhesions were lysed using monopolar electrocautery and the omentum and small bowel were mobilized out of the pelvis.  The peritoneum along bilateral sidewalls was incised and the retroperitoneum opened.  Bilateral ureters were identified.  ICG was only seen in the left.  The remnant round ligament was identified and transected bilaterally.  With traction placed on the cervix, the posterior peritoneum was taken down to the level of the KOH ring.  The bladder was adherent over the entire anterior and superior aspect of the cervix.  The bladder was backfilled and an attempt to identify the between the bladder and the cervix was attempted on each side.  More laterally, the endopelvic fascia was identified however I struggle to find a plane between the bladder and the cervix more medially.  Ultimately, I decided to come through a portion of the superior cervix until ultimately I was able to develop the bladder flap anteriorly and dropped the bladder below the cervicovaginal junction.  A portion of the cervix was clearly attached to the bladder.  Using electrocautery and traction, the cervix was excised from the bladder.  An unavoidable cystotomy was created during this dissection of about 1.5 cm.  The uterine vessels were identified, isolated, cauterized, and transected bilaterally after again visualizing the ureters.  While ureterolysis was not required, the ureters were followed down to the level of their passage inferior to the uterine arteries.  The uterine vessels had been cauterized, they were cut and mobilized laterally.  The colpotomy was made circumferentially and the  cervical stump as well as the second piece of cervix that had been excised from the bladder was delivered through the vagina and sent to pathology for frozen section.  Pedicles were inspected with hemostasis noted.  The colpotomy at the vaginal cuff was closed with 0 Vicryl figure-of-eight at each apex and 0 V-Loc to close the midportion of the cuff in a running manner.  The cystotomy was noted to be approximately 1-1/2 cm although there was a larger, approximately 3-4 cm area of injury to the muscular layer of the bladder.  The bladder was repaired with running 2-0 Vicryl in 2 layers, the second of which was used to imbricate.  Several figure-of-eight sutures were then placed to help achieve hemostasis along with suture line.  Irrigation was used and excellent hemostasis was achieved.    Attention was then turned to the left ovary.  Cystectomy was performed to remove 2 small cysts from the left ovary.  There was drainage of clear fluid with these cystectomies.  Cyst walls were sent to pathology for permanent section.  The pelvis was irrigated again with excellent hemostasis noted.  The bladder was backfilled at this time with approximately 160 cc of sterile fluid  with watertight repair noted.  Cystoscopy was then performed with findings noted above.  A new Foley catheter was placed.  At this point in the procedure was completed.  Robotic instruments were removed under direct visulaization.  The robot was undocked. The subcuticular tissue was closed with 4-0 Vicryl and the skin was closed with 4-0 Monocryl in a subcuticular manner.  Dermabond was applied.    The vagina was swabbed with minimal bleeding noted. All sponge, lap and needle counts were correct x  3.   The patient was transferred to the recovery room in stable condition.  Eugene Garnet, MD

## 2023-03-09 NOTE — Progress Notes (Signed)
GYN Oncology Progress Note  Patient having moderate pelvic pain with bladder spasms. Report pain similar to preop pain. BP elevated. Plan for monitoring overnight for pain control. Will be discharged home with foley in place most likely tomorrow. Pt assessed in PACU by Dr. Pricilla Holm.

## 2023-03-09 NOTE — Op Note (Signed)
Operative Note  Preoperative diagnosis:  1. Cervical mass  Postoperative diagnosis: 1.  Same  Procedure(s): 1.  Cystoscopy with bilateral ureteral FireFly injections  Surgeon: Rhoderick Moody, MD  Assistants:  None   Anesthesia:  General  Complications:  None  EBL:  <5 mL  Specimens: 1. None  Drains/Catheters: 1.  16 French Foley  Intraoperative findings:   No intravesical pathology was seen on cystoscopy  Indication:  The patient is a 42 year old female with a history of cervical mass requiring a trachalectomy with Dr. Pricilla Holm.  Urology has been consulted to performed cystoscopy with bilateral ureteral Fire Fly injection to aide in intraoperative ureteral identification. The patient has been consented for the above procedures, voices understanding and wishes to proceed.  The patient has been consented for the above procedures, voices understanding and wishes to procede  Description of procedure: After informed consent was obtained, the patient was brought to the operating room and general endotracheal anesthesia was administered. The patient was then placed in the dorsolithotomy position and prepped and draped in usual sterile fashion. A timeout was performed. A 21 French rigid cystoscope was then inserted into the urethral meatus and advanced into the bladder under direct vision. A complete bladder survey revealed no intravesical pathology.   A 6 [place] open-ended catheter was then used to intubate the right ureteral orifice and a total of 7.5 mL of firefly solution diluted with 10 mL of saline was injected into the right collecting system. A similar maneuver was then carried out on the left with the same volume and concentration of firefly. The rigid cystoscope was then removed under direct vision. A 14 French Foley catheter was then inserted and placed to gravity drainage. The patient tolerated the procedure well. Dr. Pricilla Holm then proceeded with their portion of the  case  Plan:  Foley removal is at the discretion of the primary team.

## 2023-03-09 NOTE — Brief Op Note (Signed)
03/09/2023  12:57 PM  PATIENT:  Owens Loffler  42 y.o. female  PRE-OPERATIVE DIAGNOSIS:  CERVICAL MASS  POST-OPERATIVE DIAGNOSIS:  CERVICAL MASS  PROCEDURE:  Procedure(s): XI ROBOTIC ASSISTED TRACHELECTOMY, ovarian cystectomy (N/A) INDOCYANINE GREEN FLUORESCENCE IMAGING (ICG) (N/A)  SURGEON:  Surgeon(s) and Role: Panel 1:    Carver Fila, MD - Primary Panel 2:    * Liliane Shi, Dorian Furnace, MD - Primary  ASSISTANTS: [name] NP   ANESTHESIA:   general  EBL:  50 mL   BLOOD ADMINISTERED:none  DRAINS: none   LOCAL MEDICATIONS USED:  MARCAINE     SPECIMEN:  cervix, left ovarian cyst walls  DISPOSITION OF SPECIMEN:  PATHOLOGY  COUNTS:  YES  TOURNIQUET:  * No tourniquets in log *  DICTATION: .Note written in EPIC  PLAN OF CARE: Discharge to home after PACU  PATIENT DISPOSITION:  PACU - hemodynamically stable.   Delay start of Pharmacological VTE agent (>24hrs) due to surgical blood loss or risk of bleeding: not applicable

## 2023-03-09 NOTE — Anesthesia Preprocedure Evaluation (Signed)
Anesthesia Evaluation  Patient identified by MRN, date of birth, ID band Patient awake    Reviewed: Allergy & Precautions, H&P , NPO status , Patient's Chart, lab work & pertinent test results  Airway Mallampati: II   Neck ROM: full    Dental   Pulmonary asthma , Current Smoker and Patient abstained from smoking.   breath sounds clear to auscultation       Cardiovascular negative cardio ROS  Rhythm:regular Rate:Normal     Neuro/Psych  Neuromuscular disease    GI/Hepatic   Endo/Other  obese  Renal/GU      Musculoskeletal   Abdominal   Peds  Hematology   Anesthesia Other Findings   Reproductive/Obstetrics Cervical mass                             Anesthesia Physical Anesthesia Plan  ASA: 2  Anesthesia Plan: General   Post-op Pain Management:    Induction: Intravenous  PONV Risk Score and Plan: 2 and Ondansetron, Dexamethasone, Midazolam and Treatment may vary due to age or medical condition  Airway Management Planned: Oral ETT  Additional Equipment:   Intra-op Plan:   Post-operative Plan: Extubation in OR  Informed Consent: I have reviewed the patients History and Physical, chart, labs and discussed the procedure including the risks, benefits and alternatives for the proposed anesthesia with the patient or authorized representative who has indicated his/her understanding and acceptance.     Dental advisory given  Plan Discussed with: Anesthesiologist, CRNA and Surgeon  Anesthesia Plan Comments:        Anesthesia Quick Evaluation

## 2023-03-09 NOTE — Interval H&P Note (Signed)
History and Physical Interval Note:  03/09/2023 7:41 AM  Kimberly Armstrong  has presented today for surgery, with the diagnosis of CERVICAL MASS.  The various methods of treatment have been discussed with the patient and family. After consideration of risks, benefits and other options for treatment, the patient has consented to  Procedure(s): XI ROBOTIC ASSISTED TRACHELECTOMY, POSSIBLE UNILATERAL VERSUS BILATERAL OOPHORECTOMY, POSSIBLE LAPAROTOMY (N/A) INDOCYANINE GREEN FLUORESCENCE IMAGING (ICG) (N/A) as a surgical intervention.  The patient's history has been reviewed, patient examined, no change in status, stable for surgery.  I have reviewed the patient's chart and labs.  Questions were answered to the patient's satisfaction.     Carver Fila

## 2023-03-09 NOTE — Consult Note (Signed)
Urology Consult   Physician requesting consult: Eugene Garnet, MD   Reason for consult: Ureteral Firefly Injections  History of Present Illness: Kimberly Armstrong is a 42 y.o. female with a cervical mass and is undergoing a robotic trachelectomy and possible oophorectomy with Dr. Pricilla Holm.  The patient denies a history of voiding or storage urinary symptoms, hematuria, UTIs, STDs, urolithiasis, GU malignancy/trauma/surgery.  Past Medical History:  Diagnosis Date   Anemia    Asthma    TID use currently 2.2024   Endometriosis    Tobacco abuse     Past Surgical History:  Procedure Laterality Date   BACK SURGERY     carpel tunnel     CESAREAN SECTION     x3   ENDOMETRIAL ABLATION     EYE SURGERY N/A    [facility] 11/30/2020   OTHER SURGICAL HISTORY  2013   removal of scar tissue (endometriosis) from Pfannenstiel incision   PARTIAL HYSTERECTOMY  2017   cervix, ovaries left in situ   SPINE SURGERY N/A    [facility] 11/30/2020   TUBAL LIGATION N/A    [facility] 11/30/2020, at time of last section    Current Hospital Medications:  Home Meds:  Current Meds  Medication Sig   acetaminophen (TYLENOL) 500 MG tablet Take 1,000 mg by mouth every 6 (six) hours as needed for moderate pain.   albuterol (VENTOLIN HFA) 108 (90 Base) MCG/ACT inhaler TAKE 2 PUFFS BY MOUTH EVERY 6 HOURS AS NEEDED FOR WHEEZE OR SHORTNESS OF BREATH   Ascorbic Acid (VITAMIN C PO) Take 1 tablet by mouth daily.   b complex vitamins capsule Take 1 capsule by mouth daily.   cetirizine (ZYRTEC) 10 MG tablet TAKE 1 TABLET BY MOUTH EVERY DAY   cyclobenzaprine (FLEXERIL) 10 MG tablet TAKE 1/2 TO 1 TABLET BY MOUTH DAILY AT BEDTIME AS NEEDED FOR MUSCLE PAIN   ibuprofen (ADVIL) 800 MG tablet Take 1 tablet (800 mg total) by mouth every 8 (eight) hours as needed for moderate pain. For AFTER surgery only   meloxicam (MOBIC) 15 MG tablet Take 15 mg by mouth daily.   montelukast (SINGULAIR) 10 MG tablet TAKE 1 TABLET BY  MOUTH EVERYDAY AT BEDTIME   norethindrone (ORTHO MICRONOR) 0.35 MG tablet Take 1 tablet (0.35 mg total) by mouth daily.   [EXPIRED] predniSONE (DELTASONE) 20 MG tablet Take 2 tablets (40 mg total) by mouth daily with breakfast for 5 days.   [DISCONTINUED] ibuprofen (ADVIL) 200 MG tablet Take 400 mg by mouth every 6 (six) hours as needed for moderate pain.    Scheduled Meds:  acetaminophen       dexamethasone (DECADRON) injection  4 mg Intravenous On Call to OR   gabapentin       heparin       ketorolac  15 mg Intravenous On Call to OR   scopolamine  1 patch Transdermal On Call to OR   scopolamine       Continuous Infusions:  ceFAZolin      ceFAZolin (ANCEF) IV     lactated ringers 10 mL/hr at 03/09/23 0735   PRN Meds:.acetaminophen, ceFAZolin, gabapentin, heparin, scopolamine  Allergies:  Allergies  Allergen Reactions   Sulfa Antibiotics Hives and Rash    Family History  Problem Relation Age of Onset   Breast cancer Mother    Heart attack Mother    Hypertension Mother    Diabetes Sister    Breast cancer Maternal Grandmother    Dementia Paternal Grandmother  Colon cancer Neg Hx    Esophageal cancer Neg Hx    Stomach cancer Neg Hx    Colon polyps Neg Hx    Pancreatic cancer Neg Hx     Social History:  reports that she has been smoking cigarettes. She started smoking about 22 years ago. She has a 12.50 pack-year smoking history. She has never used smokeless tobacco. She reports current alcohol use of about 2.0 standard drinks of alcohol per week. She reports that she does not use drugs.  ROS: A complete review of systems was performed.  All systems are negative except for pertinent findings as noted.  Physical Exam:  Vital signs in last 24 hours: Temp:  [99.5 F (37.5 C)] 99.5 F (37.5 C) (07/10 0700) Pulse Rate:  [77] 77 (07/10 0700) Resp:  [18] 18 (07/10 0700) BP: (139)/(80) 139/80 (07/10 0700) SpO2:  [96 %] 96 % (07/10 0700) Weight:  [110.6 kg] 110.6 kg  (07/10 0710) Constitutional:  Alert and oriented, No acute distress Cardiovascular: Regular rate and rhythm, No JVD Respiratory: Normal respiratory effort, Lungs clear bilaterally GI: Abdomen is soft, nontender, nondistended, no abdominal masses GU: No CVA tenderness Lymphatic: No lymphadenopathy Neurologic: Grossly intact, no focal deficits Psychiatric: Normal mood and affect  Laboratory Data:  No results for input(s): "WBC", "HGB", "HCT", "PLT" in the last 72 hours.  No results for input(s): "NA", "K", "CL", "GLUCOSE", "BUN", "CALCIUM", "CREATININE" in the last 72 hours.  Invalid input(s): "CO3"   No results found for this or any previous visit (from the past 24 hour(s)). No results found for this or any previous visit (from the past 240 hour(s)).  Renal Function: Recent Labs    03/02/23 1418  CREATININE 0.86   Estimated Creatinine Clearance: 113.6 mL/min (by C-G formula based on SCr of 0.86 mg/dL).  Radiologic Imaging: No results found.  I independently reviewed the above imaging studies.  Impression/Recommendation 42 year old female with cervical mass and chronic pain  -The risks, benefits and alternatives of cystoscopy with bilateral ureteral fire fly injections was discussed with the patient. She voices understanding and wishes to proceed.   Rhoderick Moody, MD [facility] 03/09/2023, 9:04 AM

## 2023-03-10 ENCOUNTER — Inpatient Hospital Stay: Payer: Medicaid Other | Admitting: Gynecologic Oncology

## 2023-03-10 ENCOUNTER — Other Ambulatory Visit (HOSPITAL_COMMUNITY): Payer: Managed Care, Other (non HMO)

## 2023-03-10 ENCOUNTER — Telehealth: Payer: Self-pay | Admitting: *Deleted

## 2023-03-10 DIAGNOSIS — N888 Other specified noninflammatory disorders of cervix uteri: Secondary | ICD-10-CM | POA: Diagnosis not present

## 2023-03-10 LAB — BASIC METABOLIC PANEL
Anion gap: 7 (ref 5–15)
BUN: 11 mg/dL (ref 6–20)
CO2: 23 mmol/L (ref 22–32)
Calcium: 8.3 mg/dL — ABNORMAL LOW (ref 8.9–10.3)
Chloride: 107 mmol/L (ref 98–111)
Creatinine, Ser: 0.89 mg/dL (ref 0.44–1.00)
GFR, Estimated: >60 mL/min (ref >60)
Glucose, Bld: 104 mg/dL — ABNORMAL HIGH (ref 70–99)
Potassium: 3.5 mmol/L (ref 3.5–5.1)
Sodium: 137 mmol/L (ref 135–145)

## 2023-03-10 LAB — CBC
HCT: 45 % (ref 36.0–46.0)
Hemoglobin: 15 g/dL (ref 12.0–15.0)
MCH: 30.9 pg (ref 26.0–34.0)
MCHC: 33.3 g/dL (ref 30.0–36.0)
MCV: 92.8 fL (ref 80.0–100.0)
Platelets: 185 10*3/uL (ref 150–400)
RBC: 4.85 MIL/uL (ref 3.87–5.11)
RDW: 12.5 % (ref 11.5–15.5)
WBC: 15.7 10*3/uL — ABNORMAL HIGH (ref 4.0–10.5)
nRBC: 0 % (ref 0.0–0.2)

## 2023-03-10 MED ORDER — PHENAZOPYRIDINE HCL 100 MG PO TABS: 100.0000 mg | ORAL_TABLET | Freq: Three times a day (TID) | ORAL | 0 refills | Status: DC | PRN

## 2023-03-10 MED ORDER — CHLORHEXIDINE GLUCONATE CLOTH 2 % EX PADS: 6.0000 | MEDICATED_PAD | Freq: Every day | CUTANEOUS | Status: DC

## 2023-03-10 NOTE — Progress Notes (Signed)
Patient was provided with discharge education and catheter care, patient verbalized understanding. IV's removed.

## 2023-03-10 NOTE — Discharge Summary (Signed)
Physician Discharge Summary  Patient ID: Kimberly Armstrong MRN: [id number] DOB/AGE: 02/14/1981 42 y.o.  Admit date: 03/09/2023 Discharge date: 03/10/2023  Admission Diagnoses: Cervical mass  Discharge Diagnoses:  Principal Problem:   Cervical mass Active Problems:   Cyst of left ovary   Discharged Condition:  The patient is in good condition and stable for discharge.    Hospital Course: On 03/09/2023, the patient underwent the following: Procedure(s): XI ROBOTIC ASSISTED TRACHELECTOMY, ovarian cystectomy, (ICG) injection in the ureters, cystotomy repair. She was kept overnight due moderate to severe postop pain experienced with goal for improved pain control. Overall, the postoperative course was uneventful.  She was discharged to home on postoperative day 1 tolerating a regular diet, passing flatus, foley in place, pain improved and controlled with po pain meds, ambulating without difficulty.   Consults: None  Significant Diagnostic Studies: Am labs  Treatments: surgery: see above  Discharge Exam: Blood pressure 121/71, pulse 70, temperature 98.1 F (36.7 C), temperature source Oral, resp. rate 18, height 5\' 10"  (1.778 m), weight 243 lb 13.3 oz (110.6 kg), SpO2 99%. General appearance: alert, cooperative, appears stated age, and no distress Resp: clear to auscultation bilaterally Cardio: regular rate and rhythm, S1, S2 normal, no murmur, click, rub or gallop GI: soft, non-tender; bowel sounds normal; no masses,  no organomegaly Extremities: extremities normal, atraumatic, no cyanosis or edema Incision/Wound: Lap sites to the abdomen with dermabond intact with no active drainage or bleeding SCDs on Foley with clear, yellow urine  Disposition: Discharge disposition: 01-Home or Self Care       Discharge Instructions     Call MD for:  difficulty breathing, headache or visual disturbances   Complete by: As directed    Call MD for:  difficulty breathing, headache  or visual disturbances   Complete by: As directed    Call MD for:  extreme fatigue   Complete by: As directed    Call MD for:  extreme fatigue   Complete by: As directed    Call MD for:  hives   Complete by: As directed    Call MD for:  hives   Complete by: As directed    Call MD for:  persistant dizziness or light-headedness   Complete by: As directed    Call MD for:  persistant dizziness or light-headedness   Complete by: As directed    Call MD for:  persistant nausea and vomiting   Complete by: As directed    Call MD for:  persistant nausea and vomiting   Complete by: As directed    Call MD for:  redness, tenderness, or signs of infection (pain, swelling, redness, odor or green/yellow discharge around incision site)   Complete by: As directed    Call MD for:  redness, tenderness, or signs of infection (pain, swelling, redness, odor or green/yellow discharge around incision site)   Complete by: As directed    Call MD for:  severe uncontrolled pain   Complete by: As directed    Call MD for:  severe uncontrolled pain   Complete by: As directed    Call MD for:  temperature >100.4   Complete by: As directed    Call MD for:  temperature >100.4   Complete by: As directed    Diet - low sodium heart healthy   Complete by: As directed    Diet - low sodium heart healthy   Complete by: As directed    Discharge instructions   Complete by: As directed  You are being discharged home with the foley catheter in place. We will plan for you to come back to the hospital around 10 days after surgery to have a scan to check the bladder. If healed well with no evidence of leaks, we will plan on removing the catheter in the office.   Driving Restrictions   Complete by: As directed    No driving for 1 week(s).  Do not take narcotics and drive. You need to make sure your reaction time has returned.   Driving Restrictions   Complete by: As directed    No driving for 1 week(s).  Do not take  narcotics and drive. You need to make sure your reaction time has returned.   Increase activity slowly   Complete by: As directed    Increase activity slowly   Complete by: As directed    Lifting restrictions   Complete by: As directed    No lifting greater than 10 lbs, pushing, pulling, straining for 6 weeks.   Lifting restrictions   Complete by: As directed    No lifting greater than 10 lbs, pushing, pulling, straining for 6 weeks.   Sexual Activity Restrictions   Complete by: As directed    No sexual activity, nothing in the vagina, for 10-12 weeks.   Sexual Activity Restrictions   Complete by: As directed    No sexual activity, nothing in the vagina, for 8-10 weeks.      Allergies as of 03/10/2023       Reactions   Sulfa Antibiotics Hives, Rash        Medication List     TAKE these medications    acetaminophen 500 MG tablet Commonly known as: TYLENOL Take 1,000 mg by mouth every 6 (six) hours as needed for moderate pain.   albuterol 108 (90 Base) MCG/ACT inhaler Commonly known as: VENTOLIN HFA TAKE 2 PUFFS BY MOUTH EVERY 6 HOURS AS NEEDED FOR WHEEZE OR SHORTNESS OF BREATH   b complex vitamins capsule Take 1 capsule by mouth daily.   budesonide-formoterol 160-4.5 MCG/ACT inhaler Commonly known as: SYMBICORT Inhale 2 puffs into the lungs 2 (two) times daily.   cetirizine 10 MG tablet Commonly known as: ZYRTEC TAKE 1 TABLET BY MOUTH EVERY DAY   cyclobenzaprine 10 MG tablet Commonly known as: FLEXERIL TAKE 1/2 TO 1 TABLET BY MOUTH DAILY AT BEDTIME AS NEEDED FOR MUSCLE PAIN   fluticasone 50 MCG/ACT nasal spray Commonly known as: FLONASE SPRAY 2 SPRAYS INTO EACH NOSTRIL EVERY DAY What changed:  how much to take how to take this when to take this reasons to take this additional instructions   ibuprofen 800 MG tablet Commonly known as: ADVIL Take 1 tablet (800 mg total) by mouth every 8 (eight) hours as needed for moderate pain. For AFTER surgery only    meloxicam 15 MG tablet Commonly known as: MOBIC Take 15 mg by mouth daily.   montelukast 10 MG tablet Commonly known as: SINGULAIR TAKE 1 TABLET BY MOUTH EVERYDAY AT BEDTIME   mupirocin ointment 2 % Commonly known as: BACTROBAN Apply 1 Application topically daily as needed (wound care).   norethindrone 0.35 MG tablet Commonly known as: Ortho Micronor Take 1 tablet (0.35 mg total) by mouth daily.   oxyCODONE 5 MG immediate release tablet Commonly known as: Oxy IR/ROXICODONE Take 1 tablet (5 mg total) by mouth every 4 (four) hours as needed for severe pain. For AFTER surgery only, do not take and drive   phenazopyridine 161  MG tablet Commonly known as: Pyridium Take 1 tablet (100 mg total) by mouth 3 (three) times daily as needed for pain (bladder spasms).   senna-docusate 8.6-50 MG tablet Commonly known as: Senokot-S Take 1 tablet by mouth at bedtime as needed for mild constipation or moderate constipation.   VITAMIN C PO Take 1 tablet by mouth daily.        Follow-up Information     Carver Fila, MD Follow up on 04/08/2023.   Specialty: Gynecologic Oncology Why: at 8:15 am at the [facility] - Inpatient information: [address] Jo[place]n Qu[place] [address] [phone]         [facility] Gynecological Oncology Follow up.   Specialty: Gynecologic Oncology Why: If radiology does not remove your foley, we will plan on seeing you in the office 10 days after surgery for possible foley removal. We will arrange for a cystogram to check the bladder before Contact information: [address] [id number] [place] [address] [phone]                Greater than thirty minutes were spend for face to face discharge instructions and discharge orders/summary in EPIC.   Signed: Doylene Bode 03/10/2023, 8:39 AM

## 2023-03-10 NOTE — Telephone Encounter (Signed)
Per Dr Pricilla Holm scheduled cystogram and nurse eval appt on 7/22. Patient given the date and times

## 2023-03-10 NOTE — Plan of Care (Signed)
  Problem: Activity: Goal: Risk for activity intolerance will decrease Outcome: Adequate for Discharge   

## 2023-03-10 NOTE — Anesthesia Postprocedure Evaluation (Signed)
Anesthesia Post Note  Patient: Kimberly Armstrong  Procedure(s) Performed: XI ROBOTIC ASSISTED TRACHELECTOMY, ovarian cystectomy INDOCYANINE GREEN FLUORESCENCE IMAGING (ICG)     Patient location during evaluation: PACU Anesthesia Type: General Level of consciousness: awake and alert Pain management: pain level controlled Vital Signs Assessment: post-procedure vital signs reviewed and stable Respiratory status: spontaneous breathing, nonlabored ventilation, respiratory function stable and patient connected to nasal cannula oxygen Cardiovascular status: blood pressure returned to baseline and stable Postop Assessment: no apparent nausea or vomiting Anesthetic complications: no   No notable events documented.  Last Vitals:  Vitals:   03/10/23 0139 03/10/23 0557  BP: 116/68 121/71  Pulse: 77 70  Resp: 18 18  Temp: 36.8 C 36.7 C  SpO2: 98% 99%    Last Pain:  Vitals:   03/10/23 0731  TempSrc:   PainSc: 3                  Kingson Lohmeyer S

## 2023-03-10 NOTE — Progress Notes (Signed)
Patient given leg bag and regular foley bag and taught how to empty and catheter maintenance. All questions were answered.

## 2023-03-11 ENCOUNTER — Telehealth: Payer: Self-pay | Admitting: *Deleted

## 2023-03-11 LAB — SURGICAL PATHOLOGY

## 2023-03-11 NOTE — Telephone Encounter (Signed)
Spoke with Kimberly Armstrong this morning. She states she is eating, drinking and urinating well. She has had a BM yet and is passing gas. She is taking senokot as prescribed and encouraged her to drink plenty of water. She denies fever or chills. Incisions are dry and intact. She rates her pain 5/10. Her pain is controlled with tylenol and ibuprofen. Patient states her foley is draining well with clear, yellow urine and she emptied foley bag early this morning and it was full. Pt states leg bag has already been emptied 3 times since 0800. Pt has taken her pyridium only once and it turns her urine a vivid orange color.     Instructed to call office with any fever, chills, purulent drainage, uncontrolled pain or any other questions or concerns. Patient verbalizes understanding.    Pt aware of post op appointments as well as the office number [phone] and after hours number [phone] to call if she has any questions or concerns

## 2023-03-14 ENCOUNTER — Telehealth: Payer: Self-pay | Admitting: Surgery

## 2023-03-14 DIAGNOSIS — N888 Other specified noninflammatory disorders of cervix uteri: Secondary | ICD-10-CM

## 2023-03-14 DIAGNOSIS — Z9889 Other specified postprocedural states: Secondary | ICD-10-CM

## 2023-03-14 MED ORDER — LIDOCAINE 5 % EX OINT
1.0000 | TOPICAL_OINTMENT | Freq: Three times a day (TID) | CUTANEOUS | 1 refills | Status: DC | PRN
Start: 2023-03-14 — End: 2023-05-10

## 2023-03-14 NOTE — Telephone Encounter (Addendum)
Called patient back to relay message from [facility] regarding lidocaine cream. Patient requested prescription be sent to [facility], as she is not comfortable driving yet. Advised patient Kimberly Armstrong would be notified and prescription called in for her. Patient advised that she can also pick up OTC lidocaine 4% cream or gel.

## 2023-03-14 NOTE — Telephone Encounter (Signed)
Patient called in with some questions regarding her foley catheter. She states she is having some discomfort when she sits and a burning/itching sensation at the entrance to her urethra. Patient states she is showering and trying to keep the area clean but called because she isn't sure if these symptoms are normal. Advised patient that some discomfort when sitting can be expected, and to try using a pillow to relieve some pressure when she is sitting. Regarding her burning and itching symptoms, patient denies any foul odor or discharge, denies pain or bleeding. States the pyridium did not help with her symptoms. Advised patient to continue to keep the area clean, wiping front to back and patting dry. Advised her to continue to monitor symptoms and that Dr Pricilla Holm would be notified of her concerns. If Dr Pricilla Holm has any additional recommendations our office will call her back. Patient verbalized understanding and had no other concerns at this time.

## 2023-03-15 NOTE — Telephone Encounter (Signed)
Are symptoms concerning for yeast? If not better with lidocaine, may be worth trying dose of Diflucan

## 2023-03-16 ENCOUNTER — Telehealth: Payer: Self-pay | Admitting: *Deleted

## 2023-03-16 ENCOUNTER — Other Ambulatory Visit: Payer: Self-pay | Admitting: Gynecologic Oncology

## 2023-03-16 DIAGNOSIS — D487 Neoplasm of uncertain behavior of other specified sites: Secondary | ICD-10-CM

## 2023-03-16 DIAGNOSIS — N888 Other specified noninflammatory disorders of cervix uteri: Secondary | ICD-10-CM

## 2023-03-16 NOTE — Telephone Encounter (Signed)
Spoke with [name] and relayed message from [name], NP that a refill for Pyridium will be sent. Pt thanked the office for calling, no further concerns or questions at this time. Pt reminded of her appt. With the office on Monday, July 22nd at 3:30 pm.

## 2023-03-16 NOTE — Telephone Encounter (Signed)
Spoke with Ms. Ruscitti who is requesting a refill on her Rx of pyridium. Patient states, 'I didn't use any yesterday, and only have 1 left. Pt states "I am still having the bladder spasms but the pyridium is also working for the catheter irritation as well.   Patient states she has a lot of pressure and bladder fullness that comes and goes with urgency while using both the foley bag at night and the leg bag during the day and pt is emptying both often. The foley bag is draining a full liter at night and the leg bag is emptied several times a day.  Pt denies, pain, burning, fever and or chills. Pt is keeping herself hydrated and states she is only able to eat 1/2 meal and feels full shortly after. Pt is having regular bowel movements, but states, "I had to use miralax once and that really helped."  Encouraged pt to continue taking the senokot as needed. Pt is not taking oxycodone and only trying to use tylenol if needed.   Advised patient that her message would be relayed to providers and the office would call back with recommendations.

## 2023-03-17 DIAGNOSIS — D487 Neoplasm of uncertain behavior of other specified sites: Secondary | ICD-10-CM

## 2023-03-17 DIAGNOSIS — N888 Other specified noninflammatory disorders of cervix uteri: Secondary | ICD-10-CM

## 2023-03-21 ENCOUNTER — Ambulatory Visit (HOSPITAL_COMMUNITY)
Admission: RE | Admit: 2023-03-21 | Discharge: 2023-03-21 | Disposition: A | Discharge status: Home / Self Care | Payer: Medicaid Other | Source: Ambulatory Visit | Attending: Gynecologic Oncology | Admitting: Gynecologic Oncology

## 2023-03-21 ENCOUNTER — Inpatient Hospital Stay (HOSPITAL_BASED_OUTPATIENT_CLINIC_OR_DEPARTMENT_OTHER): Payer: Medicaid Other

## 2023-03-21 ENCOUNTER — Other Ambulatory Visit: Payer: Self-pay

## 2023-03-21 VITALS — BP 128/76 | HR 80 | Temp 98.4°F | Resp 18

## 2023-03-21 DIAGNOSIS — Z9889 Other specified postprocedural states: Secondary | ICD-10-CM | POA: Insufficient documentation

## 2023-03-21 DIAGNOSIS — N888 Other specified noninflammatory disorders of cervix uteri: Secondary | ICD-10-CM | POA: Insufficient documentation

## 2023-03-21 MED ORDER — IOTHALAMATE MEGLUMINE 17.2 % UR SOLN
URETHRAL | Status: AC
  Filled 2023-03-21: qty 250

## 2023-03-21 MED ORDER — IOTHALAMATE MEGLUMINE 17.2 % UR SOLN
250.0000 mL | Freq: Once | URETHRAL | Status: AC | PRN
  Administered 2023-03-21: 200 mL via URETHRAL

## 2023-03-21 MED ORDER — IOTHALAMATE MEGLUMINE 17.2 % UR SOLN: 250.0000 mL | Freq: Once | URETHRAL | Status: DC | PRN

## 2023-03-21 NOTE — Progress Notes (Unsigned)
Ms. Sinyard was seen in clinic for foley catheter removal. Pt completed cystogram prior to her appointment and per Dr. Amil Amen note from radiology. Cystogram shows no bladder leak.   Foley catheter removed per protocol for clear yellow urine. Pt instructed to call the office with any urinary symptoms, concerns or questions.  Pt states the discomfort to her right groin has not worsened, is not visually swollen and it's without erythema. Pt verbalized understanding.

## 2023-03-21 NOTE — Patient Instructions (Signed)
Please call the office if you have difficulty emptying your bladder. Even if you do not have the urge, attempt to urinate at least every 3-4 hours during the day.  Monitor the marked bruising on your abdomen and call for redness, increased pain, warmth, drainage.  Also plan on monitoring the area in the right groin and call for any changes, increase in size etc.

## 2023-03-24 ENCOUNTER — Telehealth: Payer: Self-pay | Admitting: *Deleted

## 2023-03-24 NOTE — Telephone Encounter (Signed)
Spoke with Kimberly Armstrong to follow up since nurse visit and catheter removal on Monday, July 22nd.   Patient states she is doing well, initial irritation and burning the day catheter was removed but that has all since resolved.  Pt also states area to right groin has shrunk and she denies pain, redness and swelling.   Patient asking about a release of work form for her employer because she may want to go back to work sooner. Pt has a post op follow up appt. With Dr. Pricilla Holm on Friday, August 9th. Advised patient to discuss with provider at that time.   Pt instructed to call with any concerns or questions. Pt verbalized understanding.

## 2023-03-25 ENCOUNTER — Other Ambulatory Visit: Payer: Self-pay

## 2023-03-25 ENCOUNTER — Inpatient Hospital Stay: Payer: Medicaid Other

## 2023-03-25 ENCOUNTER — Telehealth: Payer: Self-pay | Admitting: *Deleted

## 2023-03-25 DIAGNOSIS — Z9889 Other specified postprocedural states: Secondary | ICD-10-CM

## 2023-03-25 DIAGNOSIS — R102 Pelvic and perineal pain: Secondary | ICD-10-CM | POA: Diagnosis not present

## 2023-03-25 LAB — URINALYSIS, COMPLETE (UACMP) WITH MICROSCOPIC
Bilirubin Urine: NEGATIVE
Glucose, UA: NEGATIVE mg/dL
Ketones, ur: NEGATIVE mg/dL
Leukocytes,Ua: NEGATIVE
Nitrite: NEGATIVE
Protein, ur: NEGATIVE mg/dL
Specific Gravity, Urine: 1.001 — ABNORMAL LOW (ref 1.005–1.030)
pH: 6 (ref 5.0–8.0)

## 2023-03-25 NOTE — Telephone Encounter (Signed)
Spoke with Ms. Hoecker and relayed message from [name], NP that pt's urinalysis is normal. Urine culture is still pending. Advised patient to monitor  symptoms and continue keeping herself hydrated, add cranberry juice and Vit C tablets and the office will reach out with urine culture results. Pt thanked the office for calling and instructed to call with any worsening symptoms. Pt verbalized understanding.

## 2023-03-25 NOTE — Telephone Encounter (Signed)
Spoke with Kimberly Armstrong who called the office stating in the last 24 hours her urine has looked cloudy and has a distinct odor that doesn't seem normal. Pt denies fever, chills, pain, burning and or itching.  Urine is a golden yellow color, pt is drinking plenty of water, denies any dietary changes. The only medication change was the patient stopped taking her birth control pill on Tuesday, July 23 rd.   Message related to [name], NP and patient was made a lab appt. This morning for a urinalysis and urine culture. Pt agreed to appointment.

## 2023-03-28 ENCOUNTER — Telehealth: Payer: Self-pay | Admitting: Surgery

## 2023-03-28 DIAGNOSIS — N390 Urinary tract infection, site not specified: Secondary | ICD-10-CM

## 2023-03-28 MED ORDER — NITROFURANTOIN MONOHYD MACRO 100 MG PO CAPS
100.0000 mg | ORAL_CAPSULE | Freq: Two times a day (BID) | ORAL | 0 refills | Status: DC
Start: 2023-03-28 — End: 2023-05-10

## 2023-03-28 NOTE — Telephone Encounter (Signed)
Called patient to let her know urine culture results and see how she's doing. Patient states her urine is still cloudy and has an odor. Denies burning or itching. Having some chills but no fever. Chills are worse at night. When she pees it feels like there is a restriction and she is not able to fully empty. She states that she has to sit on the toilet longer to get more out. Patient advised antibiotic will be called in to her preferred pharmacy and to call our office with any other concerns. Patient verbalized understanding.

## 2023-03-28 NOTE — Telephone Encounter (Signed)
Is she having any discharge? May need to have her come in tomorrow for a pelvic exam?

## 2023-03-28 NOTE — Progress Notes (Signed)
Seeing this now - can see if symptoms improve with antibiotics. If not, will have her come in later this week for an exam. Thank you

## 2023-03-28 NOTE — Telephone Encounter (Signed)
-----   Message from Doylene Bode sent at 03/28/2023  9:16 AM EDT ----- Morning! Could you please reach out and see how her urinary symptoms are? Her urine culture came back with 30,000 colonies. We usually treat when 100,000 colonies or more unless having symptoms. She also had a catheter in for a bit after surgery so more inclined to treat. Just let me know and confirm pharm and I will send abx in. Thanks ----- Message ----- From: Leory Plowman, [facility] [place] Sent: 03/25/2023  10:06 AM EDT To: Doylene Bode, NP

## 2023-04-07 NOTE — Progress Notes (Signed)
Gynecologic Oncology Return Clinic Visit  04/08/23  Reason for Visit: follow-up  Treatment History: The patient had her last delivery by C-section in 2017.  She had a tubal ligation at that time.  Shortly after she delivered, she had an IUD placed that was expelled after several heavy menses.  She denies having any pain prior to this last C-section just irregular and heavy bleeding.  Later in 2017, she underwent partial hysterectomy.  She remembers a discussion about leaving the ovaries in situ but does not remember discussion about leaving the cervix.  The physician who performed her hysterectomy in [place] has since passed away.  She [facility] [place].   Over the last 6 years, she began having pain similar to menstrual cramping.  Initially, this happened once a month with some mild cramping.  Some months she would not have any pain.  She describes this as cramping in her deep pelvis.  Within the last year and a half, she has noticed increased pain as well as increased frequency of the pain.  She now has cramping for almost 2 weeks of every month.  She has been treated for urinary tract infections, has tried changing her diet, without any effect on her pain.  She was recently started on Orilissa at the higher dose.  This helped immediately but she developed significant hot flashes, mood swings, and other symptoms.  She ultimately decided to stop the [place].  She had recurrence of her pain within a couple of days after stopping the medication with some worsening of the pain.  She restarted the Orilissa at half the dose, which helped some but she had persistent symptoms.  She ultimately decided to take a more naturalistic approach.  She stopped the [place] 3 weeks ago.  She is taking a multivitamin for women, pre and probiotic, and a vitamin B complex.  These interventions have helped significantly.  She denies any severe pain.  If she develops dull pain, she uses  Tylenol, ibuprofen, and/or a muscle relaxant.   In early 2023, she had multiple imaging studies to workup her symptoms. 09/10/21: Pelvic ultrasound reveals surgically absent uterus.  In the posterior aspect of the vaginal cuff is a poorly defined hypoechoic nodule measuring 2.2 x 1.5 x 1.7 cm that is incompletely characterized on transabdominal exam.  There is a slightly more hyperechoic nodule in the superior aspect of the vaginal cuff measuring 1.5 x 1.1 x 1.4 cm that causes mass mild effect on the bladder base.  Right ovary measures up to 4.7 cm without dominant cyst lesion.  Left ovary measures up to 6.1 cm again without dominant cystic lesion.  Both ovaries noted to be prominent in size although no definitive cystic or solid lesions seen. 09/17/2021: MRI of the pelvis shows 1.9 x 1.7 x 1.8 cm complex cystic mass in the central cervical stroma.  Cervical canal is not well-demonstrated but this lesion may be related to the canal or in the cervical stroma.  Could represent tiny cluster of multiple cyst or single multicystic lesion.  Large nabothian cyst would also be a consideration.  Cystic cervicitis is in the differential and adenoma malignancy cannot be excluded.  Second smaller nodular component along the anterior wall of the cervix is immediately anterior to the above lesion and measures up to 1.3 cm.  It generates some mass effect on the posterior wall of the urinary bladder, does not appear overtly aggressive and may simply represent scar.  Unremarkable appearance of the ovaries.   10/15/21: Pap and ECC. Pap was NIML. ECC showed scant benign glandular and squamous tissue.   Received records from [facility] in [place].  Patient was admitted in December 2017 for surgery in the setting of abnormal uterine bleeding and severe dysmenorrhea.  She underwent supracervical hysterectomy, bilateral salpingectomy, and excision of abdominal wall mass via Pfannenstiel incision.  Findings at the time of  surgery included some bladder adhesions in the setting of her prior C-sections.  Posterior cul-de-sac was described as "clear".  Normal-appearing bilateral tubes and ovaries.  There is not mention of significant pelvic adhesive disease.  There is not mention of any endometriosis within the pelvis.  It is unclear to me by the operative report why a supracervical hysterectomy was performed.   The report shows proliferative endometrium, no hyperplasia or atypia.  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  Bilateral fallopian tubes without significant pathology.  Abdominal wall mass that had been excised showed benign fibrous tissue and skeletal muscle with fibrous and reactive changes.  No endometriosis, atypia or malignancy.  02/04/23: Pelvic ultrasound Surgically absent. A septated hypoechoic region is identified in the region of the vaginal cuff measuring 1.4 x 1.2 x 0.9 cm and another adjacent hypoechoic region measures 1.2 x 1.1 x 0.8 cm. No internal blood flow.  03/09/23: Robotic-assisted trachelectomy, lysis of adhesions, unavoidable cystotomy and primary repair, cystoscopy, left ovarian cystectomies x2 Cystoscopy with ureteral ICG injection (Dr. Liliane Shi) Findings: On EUA, normal cervix.  On intra-abdominal entry, normal upper abdominal survey.  Normal small and large bowel.  Omentum with some filmy adhesions to the left pelvic sidewall and left aspect of the bladder.  Bilateral ovaries adherent to the pelvic sidewalls, she was taken.  Left ovary with 2 cysts 1 approximately 1 cm and the other 2 cm.  Both simple in appearance.  Uterus surgically absent.  Cervix not initially visible secondary to bladder adherent over the cervix.  Only the left ureter was noted to have ICG in infrared.  The bladder was quite adherent to the cervix superiorly and anteriorly.  Bladder was backfilled to help identify the contours of the bladder.  Ultimately, the cervix was transected in an effort to mobilize the bladder  without injuring it.  During removal of the portion of cervix adherent to the bladder itself, there was unavoidable cystotomy along the midline cephalad to the trigone and ureteral orifices.  This was repaired in multiple layers.  Repair was watertight and cystoscopy at the end of the procedure noted no suture in the bladder and repair more than 1 cm from each ureteral orifice.  Good efflux noted from bilateral ureteral orifices.  The cervix itself had multiple nabothian cysts.  It was sent for frozen section with nothing to freeze gross examination.   Interval History: Patient reports overall doing well.  She reports resolution of the pelvic pain that she was experiencing prior to surgery.  She still has some small amount of irritation internally when she voids.  She endorses some urinary urgency and intermittent incontinence if she is not able to get to the bathroom fast enough.  She is practicing pelvic floor exercises that she had been given when she had done PT before.  She notes some small amount of vaginal discharge as well as occasional spotting.  She notes that bowel movements have been regular, still using Senokot, but notes some constipation and having to push to have a bowel movement.  Past Medical/Surgical History:  Past Medical History:  Diagnosis Date   Anemia    Asthma    TID use currently 2.2024   Endometriosis    Tobacco abuse     Past Surgical History:  Procedure Laterality Date   BACK SURGERY     carpel tunnel     CESAREAN SECTION     x3   ENDOMETRIAL ABLATION     EYE SURGERY N/A    [employer] 11/30/2020   OTHER SURGICAL HISTORY  2013   removal of scar tissue (endometriosis) from Pfannenstiel incision   PARTIAL HYSTERECTOMY  2017   cervix, ovaries left in situ   SPINE SURGERY N/A    [facility] 11/30/2020   TUBAL LIGATION N/A    [facility] 11/30/2020, at time of last section    Family History  Problem Relation Age of Onset   Breast cancer Mother    Heart attack Mother     Hypertension Mother    Diabetes Sister    Breast cancer Maternal Grandmother    Dementia Paternal Grandmother    Colon cancer Neg Hx    Esophageal cancer Neg Hx    Stomach cancer Neg Hx    Colon polyps Neg Hx    Pancreatic cancer Neg Hx     Social History   Socioeconomic History   Marital status: Married    Spouse name: Billi Schuhmacher   Number of children: 3   Years of education: Associates degree   Highest education level: Associate degree: occupational, Scientist, product/process development, or vocational program  Occupational History   Occupation: Clinical biochemist Rep    Comment: [employer]  Tobacco Use   Smoking status: Every Day    Current packs/day: 0.50    Average packs/day: 0.5 packs/day for 25.0 years (12.5 ttl pk-yrs)    Types: Cigarettes    Start date: 12/03/2000   Smokeless tobacco: Never  Vaping Use   Vaping status: Never Used  Substance and Sexual Activity   Alcohol use: Yes    Alcohol/week: 2.0 standard drinks of alcohol    Types: 2 Glasses of wine per week    Comment: occasionally   Drug use: Never   Sexual activity: Yes    Partners: Male    Birth control/protection: Surgical    Comment: supracervical hysterectomy, menarche 42yo, sexual debut 42yo  Other Topics Concern   Not on file  Social History Narrative   Not on file   Social Determinants of Health   Financial Resource Strain: Not on file  Food Insecurity: No Food Insecurity (03/09/2023)   Hunger Vital Sign    Worried About Running Out of Food in the Last Year: Never true    Ran Out of Food in the Last Year: Never true  Transportation Needs: No Transportation Needs (03/09/2023)   PRAPARE - Administrator, Civil Service (Medical): No    Lack of Transportation (Non-Medical): No  Physical Activity: Not on file  Stress: Not on file  Social Connections: Unknown (10/25/2022)   Received from [facility]   Social Network    Social Network: Not on file    Current Medications:  Current Outpatient Medications:     conjugated estrogens (PREMARIN) vaginal cream, Place 1 Applicatorful vaginally 3 (three) times a week., Disp: 42.5 g, Rfl: 1   acetaminophen (TYLENOL) 500 MG tablet, Take 1,000 mg by mouth every 6 (six) hours as needed for moderate pain., Disp: , Rfl:    albuterol (VENTOLIN HFA) 108 (90 Base) MCG/ACT inhaler, TAKE 2 PUFFS BY MOUTH EVERY  6 HOURS AS NEEDED FOR WHEEZE OR SHORTNESS OF BREATH, Disp: 18 g, Rfl: 2   Ascorbic Acid (VITAMIN C PO), Take 1 tablet by mouth daily., Disp: , Rfl:    b complex vitamins capsule, Take 1 capsule by mouth daily., Disp: , Rfl:    budesonide-formoterol (SYMBICORT) 160-4.5 MCG/ACT inhaler, Inhale 2 puffs into the lungs 2 (two) times daily. (Patient not taking: Reported on 02/28/2023), Disp: 3 each, Rfl: 1   cetirizine (ZYRTEC) 10 MG tablet, TAKE 1 TABLET BY MOUTH EVERY DAY, Disp: 90 tablet, Rfl: 1   cyclobenzaprine (FLEXERIL) 10 MG tablet, TAKE 1/2 TO 1 TABLET BY MOUTH DAILY AT BEDTIME AS NEEDED FOR MUSCLE PAIN, Disp: 30 tablet, Rfl: 2   fluticasone (FLONASE) 50 MCG/ACT nasal spray, SPRAY 2 SPRAYS INTO EACH NOSTRIL EVERY DAY (Patient taking differently: Place 2 sprays into both nostrils daily as needed for allergies.), Disp: 16 mL, Rfl: 3   ibuprofen (ADVIL) 800 MG tablet, Take 1 tablet (800 mg total) by mouth every 8 (eight) hours as needed for moderate pain. For AFTER surgery only, Disp: 30 tablet, Rfl: 0   lidocaine (XYLOCAINE) 5 % ointment, Apply 1 Application topically 3 (three) times daily as needed. Apply to the urethra as needed, Disp: 35.44 g, Rfl: 1   meloxicam (MOBIC) 15 MG tablet, Take 15 mg by mouth daily., Disp: , Rfl:    montelukast (SINGULAIR) 10 MG tablet, TAKE 1 TABLET BY MOUTH EVERYDAY AT BEDTIME, Disp: 90 tablet, Rfl: 3   mupirocin ointment (BACTROBAN) 2 %, Apply 1 Application topically daily as needed (wound care)., Disp: , Rfl:    nitrofurantoin, macrocrystal-monohydrate, (MACROBID) 100 MG capsule, Take 1 capsule (100 mg total) by mouth 2 (two) times  daily., Disp: 10 capsule, Rfl: 0   norethindrone (ORTHO MICRONOR) 0.35 MG tablet, Take 1 tablet (0.35 mg total) by mouth daily., Disp: 28 tablet, Rfl: 11   oxyCODONE (OXY IR/ROXICODONE) 5 MG immediate release tablet, Take 1 tablet (5 mg total) by mouth every 4 (four) hours as needed for severe pain. For AFTER surgery only, do not take and drive, Disp: 20 tablet, Rfl: 0   phenazopyridine (PYRIDIUM) 100 MG tablet, TAKE 1 TABLET BY MOUTH 3 TIMES DAILY AS NEEDED FOR PAIN (BLADDER SPASMS)., Disp: 10 tablet, Rfl: 1   senna-docusate (SENOKOT-S) 8.6-50 MG tablet, Take 1 tablet by mouth at bedtime as needed for mild constipation or moderate constipation., Disp: 30 tablet, Rfl: 5  Review of Systems: + Little chills at night, dry cough, wheezing, minimal chest pain, some pain with urination, urinary frequency, vaginal discharge. Denies appetite changes, fevers, fatigue, unexplained weight changes. Denies hearing loss, neck lumps or masses, mouth sores, ringing in ears or voice changes. Denies wheezing.   Denies palpitations. Denies leg swelling. Denies abdominal distention, pain, blood in stools, constipation, diarrhea, nausea, vomiting, or early satiety. Denies pain with intercourse, hematuria or incontinence. Denies hot flashes, pelvic pain, vaginal bleeding.   Denies joint pain, back pain or muscle pain/cramps. Denies itching, rash, or wounds. Denies dizziness, headaches, numbness or seizures. Denies swollen lymph nodes or glands, denies easy bruising or bleeding. Denies anxiety, depression, confusion, or decreased concentration.  Physical Exam: BP (!) 140/78 (BP Location: Right Arm, Patient Position: Sitting)   Pulse 76   Temp 98.6 F (37 C) (Oral)   Resp 16   Ht 5\' 10"  (1.778 m)   Wt 239 lb (108.4 kg)   SpO2 99%   BMI 34.29 kg/m  General: Alert, oriented, no acute distress. HEENT: Normocephalic,  atraumatic, sclera anicteric. Chest: Unlabored breathing on room air. Abdomen: soft,  nontender.  Normoactive bowel sounds.  No masses or hepatosplenomegaly appreciated.  Well-healed incisions. Extremities: Grossly normal range of motion.  Warm, well perfused.  No edema bilaterally. Skin: No rashes or lesions noted. Lymphatics: No cervical, supraclavicular, or inguinal adenopathy. GU: Normal appearing external genitalia without erythema, excoriation, or lesions.  Speculum exam reveals normal discharge within the vaginal vault, no active bleeding noted.  There is a small, approximately 1.5 cm opening within the vaginal cuff.  No bowel or intra-abdominal organs visualized posterior to this.  Some suture still intact.  No fluctuance or tenderness with palpation.  Laboratory & Radiologic Studies: A. CERVICAL STUMP, EXCISION:  Numerous nabothian cysts and endocervicosis with overlying chronic  cervicitis and squamous metaplasia  Negative for dysplasia, diagnostic viral change and carcinoma   B. RIGHT UTERINE ARTERY REMNANT, EXCISION:  Benign artery and fibrovascular stroma  No tumor seen   C. LEFT UTERINE ARTERY REMNANT, EXCISION:  Benign artery and fibrovascular stroma  No tumor seen   D. LEFT OVARIAN CYST, CYSTECTOMY:  Benign luteal cyst  Negative for malignancy   Assessment & Plan: Kimberly Armstrong is a 41 y.o. woman with cycle pelvic pain after supracervical hysterectomy now s/p robotic trachelectomy, left ovarian cystectomy.   Patient is overall doing well.  She has a small area of cuff dehiscence on exam today.  We discussed options including procedure in the OR to close this versus letting it heal by secondary intention.  There is no evidence of infection.  She appears to have granulation tissue behind the cuff.  We discussed that if planning on close surveillance, we will start some vaginal estrogen and that lifting restrictions and pelvic rest are very important.  Today, she would like to plan on close surveillance and I will plan to see her back in clinic in 2  weeks.  Given her urinary symptoms, we will send a urine culture today to ensure no persistent urinary tract infection.  20 minutes of total time was spent for this patient encounter, including preparation, face-to-face counseling with the patient and coordination of care, and documentation of the encounter.  Eugene Garnet, MD  Division of Gynecologic Oncology  Department of Obstetrics and Gynecology  [facility]

## 2023-04-08 ENCOUNTER — Encounter: Payer: Self-pay | Admitting: Gynecologic Oncology

## 2023-04-08 ENCOUNTER — Inpatient Hospital Stay: Payer: Medicaid Other | Attending: Gynecologic Oncology | Admitting: Gynecologic Oncology

## 2023-04-08 ENCOUNTER — Other Ambulatory Visit: Payer: Self-pay

## 2023-04-08 ENCOUNTER — Inpatient Hospital Stay: Payer: Medicaid Other

## 2023-04-08 VITALS — BP 140/78 | HR 76 | Temp 98.6°F | Resp 16 | Ht 70.0 in | Wt 239.0 lb

## 2023-04-08 DIAGNOSIS — Z90710 Acquired absence of both cervix and uterus: Secondary | ICD-10-CM | POA: Diagnosis not present

## 2023-04-08 DIAGNOSIS — R32 Unspecified urinary incontinence: Secondary | ICD-10-CM | POA: Insufficient documentation

## 2023-04-08 DIAGNOSIS — R3915 Urgency of urination: Secondary | ICD-10-CM | POA: Diagnosis not present

## 2023-04-08 DIAGNOSIS — R300 Dysuria: Secondary | ICD-10-CM

## 2023-04-08 DIAGNOSIS — Z79890 Hormone replacement therapy (postmenopausal): Secondary | ICD-10-CM | POA: Insufficient documentation

## 2023-04-08 DIAGNOSIS — T8132XA Disruption of internal operation (surgical) wound, not elsewhere classified, initial encounter: Secondary | ICD-10-CM | POA: Diagnosis not present

## 2023-04-08 DIAGNOSIS — R102 Pelvic and perineal pain: Secondary | ICD-10-CM | POA: Diagnosis present

## 2023-04-08 DIAGNOSIS — T8132XD Disruption of internal operation (surgical) wound, not elsewhere classified, subsequent encounter: Secondary | ICD-10-CM | POA: Insufficient documentation

## 2023-04-08 DIAGNOSIS — Z9889 Other specified postprocedural states: Secondary | ICD-10-CM

## 2023-04-08 DIAGNOSIS — N888 Other specified noninflammatory disorders of cervix uteri: Secondary | ICD-10-CM

## 2023-04-08 DIAGNOSIS — T81328A Disruption or dehiscence of closure of other specified internal operation (surgical) wound, initial encounter: Secondary | ICD-10-CM

## 2023-04-08 DIAGNOSIS — T8131XA Disruption of external operation (surgical) wound, not elsewhere classified, initial encounter: Secondary | ICD-10-CM

## 2023-04-08 LAB — URINALYSIS, COMPLETE (UACMP) WITH MICROSCOPIC
Bacteria, UA: NONE SEEN
Bilirubin Urine: NEGATIVE
Glucose, UA: NEGATIVE mg/dL
Ketones, ur: NEGATIVE mg/dL
Leukocytes,Ua: NEGATIVE
Nitrite: NEGATIVE
Protein, ur: NEGATIVE mg/dL
Specific Gravity, Urine: 1.002 — ABNORMAL LOW (ref 1.005–1.030)
pH: 6 (ref 5.0–8.0)

## 2023-04-08 MED ORDER — ESTROGENS CONJUGATED 0.625 MG/GM VA CREA
1.0000 | TOPICAL_CREAM | VAGINAL | 1 refills | Status: DC
Start: 2023-04-08 — End: 2023-10-20

## 2023-04-08 NOTE — Patient Instructions (Signed)
It was good to see you today.  Please start some MiraLAX to help with your bowel movements.  I would really like you to have soft regular bowel function and not require a lot of straining.  He is call if you have any increase in spotting or discharge.  I will also send a prescription for vaginal estrogen to your pharmacy.  Please use this every night until I see you back in clinic.  Place a small amount on your fingertip and insert this into the bottom part of the vagina.  Please do not use the applicator.  It is really important that you do not do any heavy lifting, nothing more than 10 pounds.  Please also remember nothing in the vagina.

## 2023-04-11 ENCOUNTER — Telehealth: Payer: Self-pay | Admitting: *Deleted

## 2023-04-11 NOTE — Telephone Encounter (Signed)
Thank you - please encourage lots of hydration, continued strict pelvic rest, no heavy lifting.

## 2023-04-11 NOTE — Telephone Encounter (Signed)
Spoke with Kimberly Armstrong to reiterate the importance of keeping herself well hydrated, continue with strict pelvic rest and no heavy lifting.  Pt states she does notice when she has had plenty of fluids her urine is clear and when she doesn't it's cloudy. Pt verbalized understanding and states she purchased some liquid IV to try with her water intake.  Pt advised to call the office with any questions or concerns.

## 2023-04-11 NOTE — Telephone Encounter (Signed)
Spoke with [name] to check in on her urinary symptoms. Pt states she is doing well, still has cloudy urine that has not become worse and just about the same as last week. Pt states there is no odor and she denies pain, fever and chills.  Pt has no other concerns at this time, advised to call the office with any changes or worsening symptoms.

## 2023-04-27 NOTE — Progress Notes (Unsigned)
Gynecologic Oncology Return Clinic Visit  04/28/23  Reason for Visit: follow-up   Treatment History: The patient had her last delivery by C-section in 2017.  She had a tubal ligation at that time.  Shortly after she delivered, she had an IUD placed that was expelled after several heavy menses.  She denies having any pain prior to this last C-section just irregular and heavy bleeding.  Later in 2017, she underwent partial hysterectomy.  She remembers a discussion about leaving the ovaries in situ but does not remember discussion about leaving the cervix.  The physician who performed her hysterectomy in [place] has since passed away.  [facility] [place].   Over the last 6 years, she began having pain similar to menstrual cramping.  Initially, this happened once a month with some mild cramping.  Some months she would not have any pain.  She describes this as cramping in her deep pelvis.  Within the last year and a half, she has noticed increased pain as well as increased frequency of the pain.  She now has cramping for almost 2 weeks of every month.  She has been treated for urinary tract infections, has tried changing her diet, without any effect on her pain.  She was recently started on Orilissa at the higher dose.  This helped immediately but she developed significant hot flashes, mood swings, and other symptoms.  She ultimately decided to stop the [place].  She had recurrence of her pain within a couple of days after stopping the medication with some worsening of the pain.  She restarted the Orilissa at half the dose, which helped some but she had persistent symptoms.  She ultimately decided to take a more naturalistic approach.  She stopped the [place] 3 weeks ago.  She is taking a multivitamin for women, pre and probiotic, and a vitamin B complex.  These interventions have helped significantly.  She denies any severe pain.  If she develops dull pain, she uses  Tylenol, ibuprofen, and/or a muscle relaxant.   In early 2023, she had multiple imaging studies to workup her symptoms. 09/10/21: Pelvic ultrasound reveals surgically absent uterus.  In the posterior aspect of the vaginal cuff is a poorly defined hypoechoic nodule measuring 2.2 x 1.5 x 1.7 cm that is incompletely characterized on transabdominal exam.  There is a slightly more hyperechoic nodule in the superior aspect of the vaginal cuff measuring 1.5 x 1.1 x 1.4 cm that causes mass mild effect on the bladder base.  Right ovary measures up to 4.7 cm without dominant cyst lesion.  Left ovary measures up to 6.1 cm again without dominant cystic lesion.  Both ovaries noted to be prominent in size although no definitive cystic or solid lesions seen. 09/17/2021: MRI of the pelvis shows 1.9 x 1.7 x 1.8 cm complex cystic mass in the central cervical stroma.  Cervical canal is not well-demonstrated but this lesion may be related to the canal or in the cervical stroma.  Could represent tiny cluster of multiple cyst or single multicystic lesion.  Large nabothian cyst would also be a consideration.  Cystic cervicitis is in the differential and adenoma malignancy cannot be excluded.  Second smaller nodular component along the anterior wall of the cervix is immediately anterior to the above lesion and measures up to 1.3 cm.  It generates some mass effect on the posterior wall of the urinary bladder, does not appear overtly aggressive and may simply represent  scar.  Unremarkable appearance of the ovaries.   10/15/21: Pap and ECC. Pap was NIML. ECC showed scant benign glandular and squamous tissue.   Received records from [facility] in [place].  Patient was admitted in December 2017 for surgery in the setting of abnormal uterine bleeding and severe dysmenorrhea.  She underwent supracervical hysterectomy, bilateral salpingectomy, and excision of abdominal wall mass via Pfannenstiel incision.  Findings at the time of  surgery included some bladder adhesions in the setting of her prior C-sections.  Posterior cul-de-sac was described as "clear".  Normal-appearing bilateral tubes and ovaries.  There is not mention of significant pelvic adhesive disease.  There is not mention of any endometriosis within the pelvis.  It is unclear to me by the operative report why a supracervical hysterectomy was performed.   The report shows proliferative endometrium, no hyperplasia or atypia.  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  Bilateral fallopian tubes without significant pathology.  Abdominal wall mass that had been excised showed benign fibrous tissue and skeletal muscle with fibrous and reactive changes.  No endometriosis, atypia or malignancy.   02/04/23: Pelvic ultrasound Surgically absent. A septated hypoechoic region is identified in the region of the vaginal cuff measuring 1.4 x 1.2 x 0.9 cm and another adjacent hypoechoic region measures 1.2 x 1.1 x 0.8 cm. No internal blood flow.   03/09/23: Robotic-assisted trachelectomy, lysis of adhesions, unavoidable cystotomy and primary repair, cystoscopy, left ovarian cystectomies x2 Cystoscopy with ureteral ICG injection (Dr. Liliane Shi) Findings: On EUA, normal cervix.  On intra-abdominal entry, normal upper abdominal survey.  Normal small and large bowel.  Omentum with some filmy adhesions to the left pelvic sidewall and left aspect of the bladder.  Bilateral ovaries adherent to the pelvic sidewalls, she was taken.  Left ovary with 2 cysts 1 approximately 1 cm and the other 2 cm.  Both simple in appearance.  Uterus surgically absent.  Cervix not initially visible secondary to bladder adherent over the cervix.  Only the left ureter was noted to have ICG in infrared.  The bladder was quite adherent to the cervix superiorly and anteriorly.  Bladder was backfilled to help identify the contours of the bladder.  Ultimately, the cervix was transected in an effort to mobilize the bladder  without injuring it.  During removal of the portion of cervix adherent to the bladder itself, there was unavoidable cystotomy along the midline cephalad to the trigone and ureteral orifices.  This was repaired in multiple layers.  Repair was watertight and cystoscopy at the end of the procedure noted no suture in the bladder and repair more than 1 cm from each ureteral orifice.  Good efflux noted from bilateral ureteral orifices.  The cervix itself had multiple nabothian cysts.  It was sent for frozen section with nothing to freeze gross examination.   See on 8/9 for post-op follow-up. Small area along midportion of the cuff had opened.   Interval History: Patient reports overall doing well.  Still having some urinary frequency and urgency.  Denies any urinary incontinence.  Urine is still little bit cloudy but denies any odor or irritation.  Denies any vaginal bleeding or discharge.  Continues to use vaginal estrogen.  Has been very careful about lifting and pelvic rest.  Endorses soft bowel movements.  Denies any abdominal or pelvic pain.  Past Medical/Surgical History: Past Medical History:  Diagnosis Date   Anemia    Asthma    TID use currently 2.2024   Endometriosis  Tobacco abuse     Past Surgical History:  Procedure Laterality Date   BACK SURGERY     carpel tunnel     CESAREAN SECTION     x3   ENDOMETRIAL ABLATION     EYE SURGERY N/A    [employer] 11/30/2020   OTHER SURGICAL HISTORY  2013   removal of scar tissue (endometriosis) from Pfannenstiel incision   PARTIAL HYSTERECTOMY  2017   cervix, ovaries left in situ   SPINE SURGERY N/A    [facility] 11/30/2020   TUBAL LIGATION N/A    [facility] 11/30/2020, at time of last section    Family History  Problem Relation Age of Onset   Breast cancer Mother    Heart attack Mother    Hypertension Mother    Diabetes Sister    Breast cancer Maternal Grandmother    Dementia Paternal Grandmother    Colon cancer Neg Hx    Esophageal  cancer Neg Hx    Stomach cancer Neg Hx    Colon polyps Neg Hx    Pancreatic cancer Neg Hx     Social History   Socioeconomic History   Marital status: Married    Spouse name: Kalani Schwitters   Number of children: 3   Years of education: Associates degree   Highest education level: Associate degree: occupational, Scientist, product/process development, or vocational program  Occupational History   Occupation: Clinical biochemist Rep    Comment: [employer]  Tobacco Use   Smoking status: Every Day    Current packs/day: 0.50    Average packs/day: 0.5 packs/day for 25.0 years (12.5 ttl pk-yrs)    Types: Cigarettes    Start date: 12/03/2000   Smokeless tobacco: Never  Vaping Use   Vaping status: Never Used  Substance and Sexual Activity   Alcohol use: Yes    Alcohol/week: 2.0 standard drinks of alcohol    Types: 2 Glasses of wine per week    Comment: occasionally   Drug use: Never   Sexual activity: Yes    Partners: Male    Birth control/protection: Surgical    Comment: supracervical hysterectomy, menarche 42yo, sexual debut 42yo  Other Topics Concern   Not on file  Social History Narrative   Not on file   Social Determinants of Health   Financial Resource Strain: Not on file  Food Insecurity: No Food Insecurity (03/09/2023)   Hunger Vital Sign    Worried About Running Out of Food in the Last Year: Never true    Ran Out of Food in the Last Year: Never true  Transportation Needs: No Transportation Needs (03/09/2023)   PRAPARE - Administrator, Civil Service (Medical): No    Lack of Transportation (Non-Medical): No  Physical Activity: Not on file  Stress: Not on file  Social Connections: Unknown (10/25/2022)   Received from [facility]   Social Network    Social Network: Not on file    Current Medications:  Current Outpatient Medications:    acetaminophen (TYLENOL) 500 MG tablet, Take 1,000 mg by mouth every 6 (six) hours as needed for moderate pain., Disp: , Rfl:    albuterol (VENTOLIN HFA)  108 (90 Base) MCG/ACT inhaler, TAKE 2 PUFFS BY MOUTH EVERY 6 HOURS AS NEEDED FOR WHEEZE OR SHORTNESS OF BREATH, Disp: 18 g, Rfl: 2   Ascorbic Acid (VITAMIN C PO), Take 1 tablet by mouth daily., Disp: , Rfl:    b complex vitamins capsule, Take 1 capsule by mouth daily., Disp: , Rfl:  cetirizine (ZYRTEC) 10 MG tablet, TAKE 1 TABLET BY MOUTH EVERY DAY, Disp: 90 tablet, Rfl: 1   conjugated estrogens (PREMARIN) vaginal cream, Place 1 Applicatorful vaginally 3 (three) times a week., Disp: 42.5 g, Rfl: 1   cyclobenzaprine (FLEXERIL) 10 MG tablet, TAKE 1/2 TO 1 TABLET BY MOUTH DAILY AT BEDTIME AS NEEDED FOR MUSCLE PAIN, Disp: 30 tablet, Rfl: 2   fluticasone (FLONASE) 50 MCG/ACT nasal spray, SPRAY 2 SPRAYS INTO EACH NOSTRIL EVERY DAY (Patient taking differently: Place 2 sprays into both nostrils daily as needed for allergies.), Disp: 16 mL, Rfl: 3   ibuprofen (ADVIL) 800 MG tablet, Take 1 tablet (800 mg total) by mouth every 8 (eight) hours as needed for moderate pain. For AFTER surgery only, Disp: 30 tablet, Rfl: 0   lidocaine (XYLOCAINE) 5 % ointment, Apply 1 Application topically 3 (three) times daily as needed. Apply to the urethra as needed, Disp: 35.44 g, Rfl: 1   montelukast (SINGULAIR) 10 MG tablet, TAKE 1 TABLET BY MOUTH EVERYDAY AT BEDTIME, Disp: 90 tablet, Rfl: 3   mupirocin ointment (BACTROBAN) 2 %, Apply 1 Application topically daily as needed (wound care)., Disp: , Rfl:    nitrofurantoin, macrocrystal-monohydrate, (MACROBID) 100 MG capsule, Take 1 capsule (100 mg total) by mouth 2 (two) times daily., Disp: 10 capsule, Rfl: 0   norethindrone (ORTHO MICRONOR) 0.35 MG tablet, Take 1 tablet (0.35 mg total) by mouth daily., Disp: 28 tablet, Rfl: 11   oxyCODONE (OXY IR/ROXICODONE) 5 MG immediate release tablet, Take 1 tablet (5 mg total) by mouth every 4 (four) hours as needed for severe pain. For AFTER surgery only, do not take and drive, Disp: 20 tablet, Rfl: 0   phenazopyridine (PYRIDIUM) 100 MG  tablet, TAKE 1 TABLET BY MOUTH 3 TIMES DAILY AS NEEDED FOR PAIN (BLADDER SPASMS)., Disp: 10 tablet, Rfl: 1   senna-docusate (SENOKOT-S) 8.6-50 MG tablet, Take 1 tablet by mouth at bedtime as needed for mild constipation or moderate constipation., Disp: 30 tablet, Rfl: 5  Review of Systems: Denies appetite changes, fevers, chills, fatigue, unexplained weight changes. Denies hearing loss, neck lumps or masses, mouth sores, ringing in ears or voice changes. Denies cough or wheezing.  Denies shortness of breath. Denies chest pain or palpitations. Denies leg swelling. Denies abdominal distention, pain, blood in stools, constipation, diarrhea, nausea, vomiting, or early satiety. Denies pain with intercourse, dysuria, hematuria or incontinence. Denies hot flashes, pelvic pain, vaginal bleeding or vaginal discharge.   Denies joint pain, back pain or muscle pain/cramps. Denies itching, rash, or wounds. Denies dizziness, headaches, numbness or seizures. Denies swollen lymph nodes or glands, denies easy bruising or bleeding. Denies anxiety, depression, confusion, or decreased concentration.  Physical Exam: BP (!) 142/88 (BP Location: Right Arm, Patient Position: Sitting)   Pulse 84   Temp 98.6 F (37 C) (Oral)   Resp 18   Wt 236 lb (107 kg)   SpO2 100%   BMI 33.86 kg/m  General: Alert, oriented, no acute distress. HEENT: Normocephalic, atraumatic, sclera anicteric. Chest: Unlabored breathing on room air.  GU: Normal appearing external genitalia without erythema, excoriation, or lesions.  Speculum exam reveals cuff is almost completely healed with just a small, less than 5 mm opening in the midline.  The tissue looks very healthy.  There is no discharge or bleeding.  Suture still visible.  Bimanual exam, the cuff is intact.  Laboratory & Radiologic Studies: None new  Assessment & Plan: Kimberly Armstrong is a 42 y.o. woman with cycle pelvic  pain after supracervical hysterectomy now  s/p robotic trachelectomy, left ovarian cystectomy.  Partial dehiscence of the vaginal cuff diagnosed on 8/9.  Patient continues to heal very well with almost complete closure of her vaginal cuff.  Will continue with vaginal estrogen.  Continue with lifting restrictions as well as pelvic rest.  I will see her back in 6 weeks.  If cuff is closed at that time, will release to full activity.  Discussed that if urinary frequency and urgency continues, can have her seen by pelvic floor physical therapy when she has completely recovered.  18 minutes of total time was spent for this patient encounter, including preparation, face-to-face counseling with the patient and coordination of care, and documentation of the encounter.  Eugene Garnet, MD  Division of Gynecologic Oncology  Department of Obstetrics and Gynecology  [facility]

## 2023-04-28 ENCOUNTER — Inpatient Hospital Stay (HOSPITAL_BASED_OUTPATIENT_CLINIC_OR_DEPARTMENT_OTHER): Payer: Medicaid Other | Admitting: Gynecologic Oncology

## 2023-04-28 ENCOUNTER — Encounter: Payer: Self-pay | Admitting: Gynecologic Oncology

## 2023-04-28 VITALS — BP 142/88 | HR 84 | Temp 98.6°F | Resp 18 | Wt 236.0 lb

## 2023-04-28 DIAGNOSIS — T8131XD Disruption of external operation (surgical) wound, not elsewhere classified, subsequent encounter: Secondary | ICD-10-CM

## 2023-04-28 DIAGNOSIS — Z79890 Hormone replacement therapy (postmenopausal): Secondary | ICD-10-CM

## 2023-04-28 DIAGNOSIS — N888 Other specified noninflammatory disorders of cervix uteri: Secondary | ICD-10-CM

## 2023-04-28 DIAGNOSIS — Z90710 Acquired absence of both cervix and uterus: Secondary | ICD-10-CM

## 2023-04-28 NOTE — Patient Instructions (Signed)
It was great to see you today.  You are healing very well.  Please remember, no heavy lifting and nothing in the vagina until I see you back.  I will see you in 6 weeks.

## 2023-05-10 ENCOUNTER — Encounter: Payer: Self-pay | Admitting: Family Medicine

## 2023-05-10 ENCOUNTER — Ambulatory Visit (INDEPENDENT_AMBULATORY_CARE_PROVIDER_SITE_OTHER): Payer: Medicaid Other | Admitting: Family Medicine

## 2023-05-10 VITALS — BP 135/86 | HR 73 | Resp 18 | Ht 70.0 in | Wt 236.0 lb

## 2023-05-10 DIAGNOSIS — Z6833 Body mass index (BMI) 33.0-33.9, adult: Secondary | ICD-10-CM

## 2023-05-10 DIAGNOSIS — J4540 Moderate persistent asthma, uncomplicated: Secondary | ICD-10-CM

## 2023-05-10 DIAGNOSIS — N888 Other specified noninflammatory disorders of cervix uteri: Secondary | ICD-10-CM

## 2023-05-10 DIAGNOSIS — N809 Endometriosis, unspecified: Secondary | ICD-10-CM | POA: Insufficient documentation

## 2023-05-10 DIAGNOSIS — F17209 Nicotine dependence, unspecified, with unspecified nicotine-induced disorders: Secondary | ICD-10-CM

## 2023-05-10 DIAGNOSIS — Z23 Encounter for immunization: Secondary | ICD-10-CM

## 2023-05-10 DIAGNOSIS — R030 Elevated blood-pressure reading, without diagnosis of hypertension: Secondary | ICD-10-CM

## 2023-05-10 DIAGNOSIS — J302 Other seasonal allergic rhinitis: Secondary | ICD-10-CM | POA: Diagnosis not present

## 2023-05-10 MED ORDER — BLOOD PRESSURE KIT
PACK | 0 refills | Status: DC
Start: 2023-05-10 — End: 2023-10-20

## 2023-05-10 MED ORDER — FLUTICASONE-SALMETEROL 100-50 MCG/ACT IN AEPB: 1.0000 | INHALATION_SPRAY | Freq: Two times a day (BID) | RESPIRATORY_TRACT | 3 refills | Status: DC

## 2023-05-10 MED ORDER — FLUTICASONE PROPIONATE 50 MCG/ACT NA SUSP
NASAL | 3 refills | Status: AC
Start: 2023-05-10 — End: ?

## 2023-05-10 MED ORDER — LORATADINE 10 MG PO TABS
10.0000 mg | ORAL_TABLET | Freq: Every day | ORAL | 3 refills | Status: DC
Start: 2023-05-10 — End: 2024-03-07

## 2023-05-10 MED ORDER — AIRSUPRA 90-80 MCG/ACT IN AERO
2.0000 | INHALATION_SPRAY | Freq: Four times a day (QID) | RESPIRATORY_TRACT | 11 refills | Status: AC | PRN
Start: 2023-05-10 — End: ?

## 2023-05-10 NOTE — Assessment & Plan Note (Signed)
Recovering from surgery in July.  She also had dehiscence of vaginal cuff after surgery which has complicated postoperative care.  Per gynecology's notes, she will be cleared for activity as soon as the vaginal cuff is healed.  Until that time, it is likely that she will continue to experience urinary frequency and urgency.  Provided letter for work to extend time off until the end of October in order for her to fully recover from surgery and postop complication.

## 2023-05-10 NOTE — Assessment & Plan Note (Signed)
Not well controlled.  Given daily symptoms and use of rescue inhaler, qualifies for step 3 management with low-dose ICS-LABA for maintenance therapy and ICS-SABA inhaler for rescue therapy.  Sending prescriptions for Advair fluticasone-salmeterol 100-50 mcg/ACT inhaler for maintenance, Airsupra albuterol-budesonide 90-80 mcg/ACT for rescue inhaler.  Will continue to monitor.

## 2023-05-10 NOTE — Assessment & Plan Note (Signed)
Not well-controlled, often has itchiness under her eyes.  Switch from cetirizine to loratadine 10 mg daily.  Continue montelukast 10 mg daily.  Will continue to monitor.

## 2023-05-10 NOTE — Patient Instructions (Signed)
Leane Call [email or web address]  TikTok:b [email or web address] ?lang=en [email or web address]  Instagram: [email or web address]  Website: [email or web address]    Podcast: The Obesity Guide with Matthea Rentea  Send me a message on MyChart if you would like me to place a referral to [facility]!

## 2023-05-10 NOTE — Progress Notes (Signed)
Established Patient Office Visit  Subjective   Patient ID: Kimberly Armstrong, female    DOB: 1981-07-21  Age: 42 y.o. MRN: [id number]  Chief Complaint  Patient presents with   Asthma    HPI Kimberly Armstrong is a 42 y.o. female presenting today for follow up of asthma.  She is also interested in discussing options for weight management.  She has been making efforts since July and has not seen much progress which has been frustrating.  She states that she would like to avoid medications.  She also notes that she has had an increase in skin breakouts across her face, particularly underneath the eyes of redness and itching.  She was previously taking a prescription of cetirizine that ran out.  She states that she typically gets this flare around this time each year.  She is still recovering from surgery in July to remove her cervix from her bladder where they became adhered after a previous partial hysterectomy surgery.  The vaginal cuff suture unraveled after surgery and gynecology has been following her closely.  Once she is fully healed, she will be able to go back to work.  Until then, she will need an extension of the time off that previously submitted paperwork had extended until 05/20/2023. Asthma is poorly controlled.  She states that she is using her albuterol rescue inhaler 1-2 times each day every single week.  She also finds that she is short of breath with little exertion like walking up the stairs carrying a sixpack of soda.  ROS Negative unless otherwise noted in HPI   Objective:     BP 135/86 (BP Location: Left Arm, Patient Position: Sitting, Cuff Size: Large)   Pulse 73   Resp 18   Ht 5\' 10"  (1.778 m)   Wt 236 lb (107 kg)   SpO2 100%   BMI 33.86 kg/m   Physical Exam Constitutional:      General: She is not in acute distress.    Appearance: Normal appearance.  HENT:     Head: Normocephalic and atraumatic.  Cardiovascular:     Rate and Rhythm: Normal  rate and regular rhythm.     Heart sounds: No murmur heard.    No friction rub. No gallop.  Pulmonary:     Effort: Pulmonary effort is normal. No respiratory distress.     Breath sounds: No wheezing, rhonchi or rales.     Comments: No adventitious lung sounds Skin:    General: Skin is warm and dry.  Neurological:     Mental Status: She is alert and oriented to person, place, and time.     Assessment & Plan:  Moderate persistent asthma without complication Assessment & Plan: Not well controlled.  Given daily symptoms and use of rescue inhaler, qualifies for step 3 management with low-dose ICS-LABA for maintenance therapy and ICS-SABA inhaler for rescue therapy.  Sending prescriptions for Advair fluticasone-salmeterol 100-50 mcg/ACT inhaler for maintenance, Airsupra albuterol-budesonide 90-80 mcg/ACT for rescue inhaler.  Will continue to monitor.  Orders: -     Fluticasone-Salmeterol; Inhale 1 puff into the lungs 2 (two) times daily.  Dispense: 14 each; Refill: 3 -     Airsupra; Inhale 2 puffs into the lungs every 6 (six) hours as needed. RESCUE INHALER  Dispense: 5.9 g; Refill: 11  Tobacco use disorder, continuous Assessment & Plan: Current and every day smoker, though she is aware that it is not good for her health and she should stop.  With the  stress that has been occurring due to her surgery and subsequent complications, she is not ready to quit at the moment.   Seasonal allergies Assessment & Plan: Not well-controlled, often has itchiness under her eyes.  Switch from cetirizine to loratadine 10 mg daily.  Continue montelukast 10 mg daily.  Will continue to monitor.  Orders: -     Fluticasone Propionate; SPRAY 2 SPRAYS INTO EACH NOSTRIL EVERY DAY  Dispense: 16 mL; Refill: 3 -     Loratadine; Take 1 tablet (10 mg total) by mouth daily.  Dispense: 90 tablet; Refill: 3  Body mass index (BMI) of 33.0-33.9 in adult Assessment & Plan: Motivated to lose weight but would like to make  efforts in lifestyle changes and avoid medication if at all possible.  We discussed options including follow-up with primary care versus healthy weight and wellness clinic.  She is going to consider both options and send me a message if she would like to have a referral to [facility].  Until then, provided resources and information on ways to balance snacks and meals that balance the blood sugar and can be helpful for weight management.   Elevated blood pressure reading without diagnosis of hypertension Assessment & Plan: Multiple elevated blood pressures in office settings.  Sending prescription for a blood pressure cuff for her to perform ambulatory blood pressure monitoring.  If blood pressure is also elevated at home, meet criteria for diagnosis of hypertension.  Will continue to monitor.  Orders: -     Blood Pressure; Use blood pressure monitor and cuff to check blood pressure at least once daily.  Dispense: 1 kit; Refill: 0  Cervical mass Assessment & Plan: Recovering from surgery in July.  She also had dehiscence of vaginal cuff after surgery which has complicated postoperative care.  Per gynecology's notes, she will be cleared for activity as soon as the vaginal cuff is healed.  Until that time, it is likely that she will continue to experience urinary frequency and urgency.  Provided letter for work to extend time off until the end of October in order for her to fully recover from surgery and postop complication.   Need for influenza vaccination -     Flu vaccine trivalent PF, 6mos and older(Flulaval,Afluria,Fluarix,Fluzone)    Return in about 4 months (around 09/09/2023) for follow-up for asthma.   I spent 50 minutes on the day of the encounter to include pre-visit record review, face-to-face time with the patient discussing chronic conditions and acute concerns, and post visit ordering of medications.  Melida Quitter, PA

## 2023-05-10 NOTE — Assessment & Plan Note (Signed)
Motivated to lose weight but would like to make efforts in lifestyle changes and avoid medication if at all possible.  We discussed options including follow-up with primary care versus healthy weight and wellness clinic.  She is going to consider both options and send me a message if she would like to have a referral to [facility].  Until then, provided resources and information on ways to balance snacks and meals that balance the blood sugar and can be helpful for weight management.

## 2023-05-10 NOTE — Assessment & Plan Note (Signed)
Current and every day smoker, though she is aware that it is not good for her health and she should stop.  With the stress that has been occurring due to her surgery and subsequent complications, she is not ready to quit at the moment.

## 2023-05-10 NOTE — Assessment & Plan Note (Signed)
Multiple elevated blood pressures in office settings.  Sending prescription for a blood pressure cuff for her to perform ambulatory blood pressure monitoring.  If blood pressure is also elevated at home, meet criteria for diagnosis of hypertension.  Will continue to monitor.

## 2023-05-19 ENCOUNTER — Encounter: Payer: Self-pay | Admitting: Gynecologic Oncology

## 2023-05-19 ENCOUNTER — Telehealth: Payer: Self-pay | Admitting: *Deleted

## 2023-05-19 NOTE — Telephone Encounter (Signed)
Please schedule her at 8:45 tomorrow morning for an exam. Thank you

## 2023-05-19 NOTE — Telephone Encounter (Signed)
Spoke with Ms. Whitehill who called the office to report she is having an abnormal vaginal odor. Pt describes the odor as "almost fishy" denies vaginal bleeding, discharge, fever and or chills. Pt is still having cloudy urine, but states "my urine doesn't have an odor".  Pt first noticed the smell after showering and placing estrogen ointment and Monday evening felt a discomfort "(something doesn't feel right) at the opening of vagina like something poking me"  states,looks like a suture about 2 cm in length. Pt reports having nothing in the vagina and is still following surgery activity restrictions.   Pt is 9 weeks post surgery and advised patient the deep sutures could still be dissolving. Advised pt that her message would be relayed to providers and the office would call back.

## 2023-05-19 NOTE — Progress Notes (Signed)
Gynecologic Oncology Return Clinic Visit  05/20/23  Reason for Visit: follow-up   Treatment History: The patient had her last delivery by C-section in 2017.  She had a tubal ligation at that time.  Shortly after she delivered, she had an IUD placed that was expelled after several heavy menses.  She denies having any pain prior to this last C-section just irregular and heavy bleeding.  Later in 2017, she underwent partial hysterectomy.  She remembers a discussion about leaving the ovaries in situ but does not remember discussion about leaving the cervix.  The physician who performed her hysterectomy in [place] has since passed away.  [facility] [place].   Over the last 6 years, she began having pain similar to menstrual cramping.  Initially, this happened once a month with some mild cramping.  Some months she would not have any pain.  She describes this as cramping in her deep pelvis.  Within the last year and a half, she has noticed increased pain as well as increased frequency of the pain.  She now has cramping for almost 2 weeks of every month.  She has been treated for urinary tract infections, has tried changing her diet, without any effect on her pain.  She was recently started on Orilissa at the higher dose.  This helped immediately but she developed significant hot flashes, mood swings, and other symptoms.  She ultimately decided to stop the [place].  She had recurrence of her pain within a couple of days after stopping the medication with some worsening of the pain.  She restarted the Orilissa at half the dose, which helped some but she had persistent symptoms.  She ultimately decided to take a more naturalistic approach.  She stopped the [place] 3 weeks ago.  She is taking a multivitamin for women, pre and probiotic, and a vitamin B complex.  These interventions have helped significantly.  She denies any severe pain.  If she develops dull pain, she uses  Tylenol, ibuprofen, and/or a muscle relaxant.   In early 2023, she had multiple imaging studies to workup her symptoms. 09/10/21: Pelvic ultrasound reveals surgically absent uterus.  In the posterior aspect of the vaginal cuff is a poorly defined hypoechoic nodule measuring 2.2 x 1.5 x 1.7 cm that is incompletely characterized on transabdominal exam.  There is a slightly more hyperechoic nodule in the superior aspect of the vaginal cuff measuring 1.5 x 1.1 x 1.4 cm that causes mass mild effect on the bladder base.  Right ovary measures up to 4.7 cm without dominant cyst lesion.  Left ovary measures up to 6.1 cm again without dominant cystic lesion.  Both ovaries noted to be prominent in size although no definitive cystic or solid lesions seen. 09/17/2021: MRI of the pelvis shows 1.9 x 1.7 x 1.8 cm complex cystic mass in the central cervical stroma.  Cervical canal is not well-demonstrated but this lesion may be related to the canal or in the cervical stroma.  Could represent tiny cluster of multiple cyst or single multicystic lesion.  Large nabothian cyst would also be a consideration.  Cystic cervicitis is in the differential and adenoma malignancy cannot be excluded.  Second smaller nodular component along the anterior wall of the cervix is immediately anterior to the above lesion and measures up to 1.3 cm.  It generates some mass effect on the posterior wall of the urinary bladder, does not appear overtly aggressive and may simply represent  scar.  Unremarkable appearance of the ovaries.   10/15/21: Pap and ECC. Pap was NIML. ECC showed scant benign glandular and squamous tissue.   Received records from [facility] in [place].  Patient was admitted in December 2017 for surgery in the setting of abnormal uterine bleeding and severe dysmenorrhea.  She underwent supracervical hysterectomy, bilateral salpingectomy, and excision of abdominal wall mass via Pfannenstiel incision.  Findings at the time of  surgery included some bladder adhesions in the setting of her prior C-sections.  Posterior cul-de-sac was described as "clear".  Normal-appearing bilateral tubes and ovaries.  There is not mention of significant pelvic adhesive disease.  There is not mention of any endometriosis within the pelvis.  It is unclear to me by the operative report why a supracervical hysterectomy was performed.   The report shows proliferative endometrium, no hyperplasia or atypia.  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  Bilateral fallopian tubes without significant pathology.  Abdominal wall mass that had been excised showed benign fibrous tissue and skeletal muscle with fibrous and reactive changes.  No endometriosis, atypia or malignancy.   02/04/23: Pelvic ultrasound Surgically absent. A septated hypoechoic region is identified in the region of the vaginal cuff measuring 1.4 x 1.2 x 0.9 cm and another adjacent hypoechoic region measures 1.2 x 1.1 x 0.8 cm. No internal blood flow.   03/09/23: Robotic-assisted trachelectomy, lysis of adhesions, unavoidable cystotomy and primary repair, cystoscopy, left ovarian cystectomies x2 Cystoscopy with ureteral ICG injection (Dr. Liliane Shi) Findings: On EUA, normal cervix.  On intra-abdominal entry, normal upper abdominal survey.  Normal small and large bowel.  Omentum with some filmy adhesions to the left pelvic sidewall and left aspect of the bladder.  Bilateral ovaries adherent to the pelvic sidewalls, she was taken.  Left ovary with 2 cysts 1 approximately 1 cm and the other 2 cm.  Both simple in appearance.  Uterus surgically absent.  Cervix not initially visible secondary to bladder adherent over the cervix.  Only the left ureter was noted to have ICG in infrared.  The bladder was quite adherent to the cervix superiorly and anteriorly.  Bladder was backfilled to help identify the contours of the bladder.  Ultimately, the cervix was transected in an effort to mobilize the bladder  without injuring it.  During removal of the portion of cervix adherent to the bladder itself, there was unavoidable cystotomy along the midline cephalad to the trigone and ureteral orifices.  This was repaired in multiple layers.  Repair was watertight and cystoscopy at the end of the procedure noted no suture in the bladder and repair more than 1 cm from each ureteral orifice.  Good efflux noted from bilateral ureteral orifices.  The cervix itself had multiple nabothian cysts.  It was sent for frozen section with nothing to freeze gross examination.    Seen on 8/9 for post-op follow-up. Small area along midportion of the cuff had opened.   Seen on 8/29 after using vaginal estrogen with almost completely closed vaginal cuff noted.  Interval History: Patient reports overall doing well.  Yesterday, she noticed something sticking out of her vagina, ultimately fell out and identified as a suture.  Denies any vaginal bleeding or vaginal pain.  Has had a fishy odor for at least the last several days along with some vaginal discharge.  Continues to have some urinary frequency, denies any dysuria.  Past Medical/Surgical History: Past Medical History:  Diagnosis Date   Anemia    Asthma  TID use currently 2.2024   Endometriosis    Tobacco abuse     Past Surgical History:  Procedure Laterality Date   BACK SURGERY     carpel tunnel     CESAREAN SECTION     x3   ENDOMETRIAL ABLATION     EYE SURGERY N/A    [facility] 11/30/2020   OTHER SURGICAL HISTORY  2013   removal of scar tissue (endometriosis) from Pfannenstiel incision   PARTIAL HYSTERECTOMY  2017   cervix, ovaries left in situ   SPINE SURGERY N/A    [facility] 11/30/2020   TUBAL LIGATION N/A    [facility] 11/30/2020, at time of last section    Family History  Problem Relation Age of Onset   Breast cancer Mother    Heart attack Mother    Hypertension Mother    Diabetes Sister    Breast cancer Maternal Grandmother    Dementia Paternal  Grandmother    Colon cancer Neg Hx    Esophageal cancer Neg Hx    Stomach cancer Neg Hx    Colon polyps Neg Hx    Pancreatic cancer Neg Hx     Social History   Socioeconomic History   Marital status: Married    Spouse name: Kellisha Sloniker   Number of children: 3   Years of education: Associates degree   Highest education level: Associate degree: occupational, Scientist, product/process development, or vocational program  Occupational History   Occupation: Clinical biochemist Rep    Comment: [employer]  Tobacco Use   Smoking status: Every Day    Current packs/day: 0.50    Average packs/day: 0.5 packs/day for 25.1 years (12.5 ttl pk-yrs)    Types: Cigarettes    Start date: 12/03/2000    Passive exposure: Current   Smokeless tobacco: Never  Vaping Use   Vaping status: Never Used  Substance and Sexual Activity   Alcohol use: Yes    Alcohol/week: 2.0 standard drinks of alcohol    Types: 2 Glasses of wine per week    Comment: occasionally   Drug use: Never   Sexual activity: Yes    Partners: Male    Birth control/protection: Surgical    Comment: supracervical hysterectomy, menarche 42yo, sexual debut 42yo  Other Topics Concern   Not on file  Social History Narrative   Not on file   Social Determinants of Health   Financial Resource Strain: Medium Risk (05/06/2023)   Overall Financial Resource Strain (CARDIA)    Difficulty of Paying Living Expenses: Somewhat hard  Food Insecurity: Food Insecurity Present (05/06/2023)   Hunger Vital Sign    Worried About Running Out of Food in the Last Year: Sometimes true    Ran Out of Food in the Last Year: Sometimes true  Transportation Needs: No Transportation Needs (05/06/2023)   PRAPARE - Administrator, Civil Service (Medical): No    Lack of Transportation (Non-Medical): No  Physical Activity: Insufficiently Active (05/06/2023)   Exercise Vital Sign    Days of Exercise per Week: 3 days    Minutes of Exercise per Session: 20 min  Stress: No Stress Concern  Present (05/06/2023)   [employer] of Occupational Health - Occupational Stress Questionnaire    Feeling of Stress : Only a little  Social Connections: Moderately Integrated (05/06/2023)   Social Connection and Isolation Panel [NHANES]    Frequency of Communication with Friends and Family: More than three times a week    Frequency of Social Gatherings with Friends and  Family: Once a week    Attends Religious Services: More than 4 times per year    Active Member of Clubs or Organizations: No    Attends Engineer, structural: Not on file    Marital Status: Married    Current Medications:  Current Outpatient Medications:    acetaminophen (TYLENOL) 500 MG tablet, Take 1,000 mg by mouth every 6 (six) hours as needed for moderate pain., Disp: , Rfl:    albuterol (VENTOLIN HFA) 108 (90 Base) MCG/ACT inhaler, TAKE 2 PUFFS BY MOUTH EVERY 6 HOURS AS NEEDED FOR WHEEZE OR SHORTNESS OF BREATH, Disp: 18 g, Rfl: 2   Albuterol-Budesonide (AIRSUPRA) 90-80 MCG/ACT AERO, Inhale 2 puffs into the lungs every 6 (six) hours as needed. RESCUE INHALER, Disp: 5.9 g, Rfl: 11   Blood Pressure KIT, Use blood pressure monitor and cuff to check blood pressure at least once daily., Disp: 1 kit, Rfl: 0   conjugated estrogens (PREMARIN) vaginal cream, Place 1 Applicatorful vaginally 3 (three) times a week., Disp: 42.5 g, Rfl: 1   cyclobenzaprine (FLEXERIL) 10 MG tablet, TAKE 1/2 TO 1 TABLET BY MOUTH DAILY AT BEDTIME AS NEEDED FOR MUSCLE PAIN, Disp: 30 tablet, Rfl: 2   fluticasone (FLONASE) 50 MCG/ACT nasal spray, SPRAY 2 SPRAYS INTO EACH NOSTRIL EVERY DAY, Disp: 16 mL, Rfl: 3   fluticasone-salmeterol (ADVAIR) 100-50 MCG/ACT AEPB, Inhale 1 puff into the lungs 2 (two) times daily., Disp: 14 each, Rfl: 3   loratadine (CLARITIN) 10 MG tablet, Take 1 tablet (10 mg total) by mouth daily., Disp: 90 tablet, Rfl: 3   metroNIDAZOLE (FLAGYL) 500 MG tablet, Take 1 tablet (500 mg total) by mouth 3 (three) times daily for 5  days., Disp: 15 tablet, Rfl: 0   montelukast (SINGULAIR) 10 MG tablet, TAKE 1 TABLET BY MOUTH EVERYDAY AT BEDTIME, Disp: 90 tablet, Rfl: 3   mupirocin ointment (BACTROBAN) 2 %, Apply 1 Application topically daily as needed (wound care)., Disp: , Rfl:    senna-docusate (SENOKOT-S) 8.6-50 MG tablet, Take 1 tablet by mouth at bedtime as needed for mild constipation or moderate constipation., Disp: 30 tablet, Rfl: 5  Review of Systems: Denies appetite changes, fevers, chills, fatigue, unexplained weight changes. Denies hearing loss, neck lumps or masses, mouth sores, ringing in ears or voice changes. Denies cough or wheezing.  Denies shortness of breath. Denies chest pain or palpitations. Denies leg swelling. Denies abdominal distention, pain, blood in stools, constipation, diarrhea, nausea, vomiting, or early satiety. Denies pain with intercourse, dysuria, hematuria or incontinence. Denies hot flashes, pelvic pain, vaginal bleeding.   Denies joint pain, back pain or muscle pain/cramps. Denies itching, rash, or wounds. Denies dizziness, headaches, numbness or seizures. Denies swollen lymph nodes or glands, denies easy bruising or bleeding. Denies anxiety, depression, confusion, or decreased concentration.  Physical Exam: BP (!) 141/86 (BP Location: Left Arm, Patient Position: Sitting) Comment: MD notified  Pulse 82   Temp 98.9 F (37.2 C) (Oral)   Resp 16   Ht 5\' 10"  (1.778 m)   Wt 241 lb (109.3 kg)   SpO2 100%   BMI 34.58 kg/m  General: Alert, oriented, no acute distress. HEENT: Normocephalic, atraumatic, sclera anicteric. Chest: Unlabored breathing on room air.  GU: Normal appearing external genitalia without erythema, excoriation, or lesions.  Speculum exam reveals cuff is healing well, some suture still visible.  Cuff completely intact, minimal physiologic discharge.  Bimanual exam reveals intact.  Laboratory & Radiologic Studies: None new  Assessment & Plan: Kimberly Koranda  Armstrong is a 42 y.o. woman with cycle pelvic pain after supracervical hysterectomy now s/p robotic trachelectomy, left ovarian cystectomy.  Partial dehiscence of the vaginal cuff diagnosed on 8/9.  Patient continues to heal well.  Passed some suture yesterday.  Still some suture visible on her exam.  Cuff itself is completely intact.  Discussed continuing vaginal estrogen.  Given symptoms suggestive of bacterial vaginosis, prescription sent for Flagyl to her pharmacy for 5 days.  Continued lifting and pelvic restrictions until her next visit with me in approximately 3 weeks.  18 minutes of total time was spent for this patient encounter, including preparation, face-to-face counseling with the patient and coordination of care, and documentation of the encounter.  Eugene Garnet, MD  Division of Gynecologic Oncology  Department of Obstetrics and Gynecology  [facility]

## 2023-05-19 NOTE — Telephone Encounter (Signed)
Called Kimberly Armstrong and scheduled an appointment with Dr. Pricilla Holm for tomorrow at [phone]. Pt agreed to date and time.

## 2023-05-20 ENCOUNTER — Encounter: Payer: Self-pay | Admitting: Gynecologic Oncology

## 2023-05-20 ENCOUNTER — Inpatient Hospital Stay: Payer: Medicaid Other | Attending: Gynecologic Oncology | Admitting: Gynecologic Oncology

## 2023-05-20 VITALS — BP 141/86 | HR 82 | Temp 98.9°F | Resp 16 | Ht 70.0 in | Wt 241.0 lb

## 2023-05-20 DIAGNOSIS — B9689 Other specified bacterial agents as the cause of diseases classified elsewhere: Secondary | ICD-10-CM

## 2023-05-20 DIAGNOSIS — T8131XD Disruption of external operation (surgical) wound, not elsewhere classified, subsequent encounter: Secondary | ICD-10-CM

## 2023-05-20 DIAGNOSIS — Z9889 Other specified postprocedural states: Secondary | ICD-10-CM

## 2023-05-20 DIAGNOSIS — N760 Acute vaginitis: Secondary | ICD-10-CM

## 2023-05-20 MED ORDER — METRONIDAZOLE 500 MG PO TABS
500.0000 mg | ORAL_TABLET | Freq: Three times a day (TID) | ORAL | 0 refills | Status: AC
Start: 2023-05-20 — End: 2023-05-25

## 2023-05-20 NOTE — Patient Instructions (Signed)
It was good to see you today!  Things are healing very well.  Please keep using the vaginal estrogen.  I am sending in a prescription for Flagyl to your pharmacy.  This is to treat bacterial vaginosis.  This is not a true infection but rather just change the normal vaginal flora with some overgrowth that causes the smell you have been experiencing.  Please be careful not to drink alcohol while taking this medication.

## 2023-05-25 ENCOUNTER — Encounter: Payer: Self-pay | Admitting: Family Medicine

## 2023-05-25 ENCOUNTER — Telehealth: Payer: Self-pay

## 2023-05-25 NOTE — Telephone Encounter (Signed)
Contacted pt she will be in tomorrow for pick up

## 2023-05-25 NOTE — Telephone Encounter (Signed)
No appointment needed, discussed paperwork at most recent office visit.  Paperwork has been completed and will be in the front office.  Patient will need to pay $29 fee when she picks it up.

## 2023-05-25 NOTE — Telephone Encounter (Signed)
Patient dropped off document FMLA, to be filled out by provider. Patient requested to send it back via Call Patient to pick up within 7-days. Document is located in providers tray at front office.Please advise at  Mobile [phone] (mobile)  Pt will still need to pay the 29$ fee.  Also please let me know if an appt is needed to complete the paperwork.

## 2023-06-01 ENCOUNTER — Ambulatory Visit: Payer: Managed Care, Other (non HMO) | Admitting: Family Medicine

## 2023-06-06 ENCOUNTER — Ambulatory Visit: Payer: Managed Care, Other (non HMO) | Admitting: Family Medicine

## 2023-06-08 ENCOUNTER — Ambulatory Visit: Payer: Medicaid Other | Admitting: Family Medicine

## 2023-06-09 ENCOUNTER — Inpatient Hospital Stay: Payer: Medicaid Other | Attending: Gynecologic Oncology | Admitting: Gynecologic Oncology

## 2023-06-09 ENCOUNTER — Encounter: Payer: Self-pay | Admitting: Gynecologic Oncology

## 2023-06-09 VITALS — BP 138/84 | HR 87 | Temp 98.4°F | Resp 20 | Ht 69.0 in | Wt 237.6 lb

## 2023-06-09 DIAGNOSIS — T81328D Disruption or dehiscence of closure of other specified internal operation (surgical) wound, subsequent encounter: Secondary | ICD-10-CM

## 2023-06-09 DIAGNOSIS — R350 Frequency of micturition: Secondary | ICD-10-CM

## 2023-06-09 DIAGNOSIS — R32 Unspecified urinary incontinence: Secondary | ICD-10-CM

## 2023-06-09 DIAGNOSIS — Z9889 Other specified postprocedural states: Secondary | ICD-10-CM

## 2023-06-09 DIAGNOSIS — N888 Other specified noninflammatory disorders of cervix uteri: Secondary | ICD-10-CM

## 2023-06-09 NOTE — Patient Instructions (Signed)
It was good to see you today.  I am lifting your restrictions in terms of heavy lifting.  Please continue with pelvic rest for another 4 weeks.  Will have a phone visit in about 2 months to check-in about your urinary symptoms.  I am placing another referral to have you see our pelvic floor physical therapist.  Continue working on your Kegel exercises.  If you do not notice some improvement in the frequency and incontinence, we will get you into see urology or urogynecology.

## 2023-06-09 NOTE — Progress Notes (Signed)
Gynecologic Oncology Return Clinic Visit  06/09/23  Reason for Visit:  follow-up   Treatment History: The patient had her last delivery by C-section in 2017.  She had a tubal ligation at that time.  Shortly after she delivered, she had an IUD placed that was expelled after several heavy menses.  She denies having any pain prior to this last C-section just irregular and heavy bleeding.  Later in 2017, she underwent partial hysterectomy.  She remembers a discussion about leaving the ovaries in situ but does not remember discussion about leaving the cervix.  The physician who performed her hysterectomy in [place] has since passed away.  [facility] [place].   Over the last 6 years, she began having pain similar to menstrual cramping.  Initially, this happened once a month with some mild cramping.  Some months she would not have any pain.  She describes this as cramping in her deep pelvis.  Within the last year and a half, she has noticed increased pain as well as increased frequency of the pain.  She now has cramping for almost 2 weeks of every month.  She has been treated for urinary tract infections, has tried changing her diet, without any effect on her pain.  She was recently started on Orilissa at the higher dose.  This helped immediately but she developed significant hot flashes, mood swings, and other symptoms.  She ultimately decided to stop the [place].  She had recurrence of her pain within a couple of days after stopping the medication with some worsening of the pain.  She restarted the Orilissa at half the dose, which helped some but she had persistent symptoms.  She ultimately decided to take a more naturalistic approach.  She stopped the [place] 3 weeks ago.  She is taking a multivitamin for women, pre and probiotic, and a vitamin B complex.  These interventions have helped significantly.  She denies any severe pain.  If she develops dull pain, she uses  Tylenol, ibuprofen, and/or a muscle relaxant.   In early 2023, she had multiple imaging studies to workup her symptoms. 09/10/21: Pelvic ultrasound reveals surgically absent uterus.  In the posterior aspect of the vaginal cuff is a poorly defined hypoechoic nodule measuring 2.2 x 1.5 x 1.7 cm that is incompletely characterized on transabdominal exam.  There is a slightly more hyperechoic nodule in the superior aspect of the vaginal cuff measuring 1.5 x 1.1 x 1.4 cm that causes mass mild effect on the bladder base.  Right ovary measures up to 4.7 cm without dominant cyst lesion.  Left ovary measures up to 6.1 cm again without dominant cystic lesion.  Both ovaries noted to be prominent in size although no definitive cystic or solid lesions seen. 09/17/2021: MRI of the pelvis shows 1.9 x 1.7 x 1.8 cm complex cystic mass in the central cervical stroma.  Cervical canal is not well-demonstrated but this lesion may be related to the canal or in the cervical stroma.  Could represent tiny cluster of multiple cyst or single multicystic lesion.  Large nabothian cyst would also be a consideration.  Cystic cervicitis is in the differential and adenoma malignancy cannot be excluded.  Second smaller nodular component along the anterior wall of the cervix is immediately anterior to the above lesion and measures up to 1.3 cm.  It generates some mass effect on the posterior wall of the urinary bladder, does not appear overtly aggressive and may simply  represent scar.  Unremarkable appearance of the ovaries.   10/15/21: Pap and ECC. Pap was NIML. ECC showed scant benign glandular and squamous tissue.   Received records from [facility] in [place].  Patient was admitted in December 2017 for surgery in the setting of abnormal uterine bleeding and severe dysmenorrhea.  She underwent supracervical hysterectomy, bilateral salpingectomy, and excision of abdominal wall mass via Pfannenstiel incision.  Findings at the time of  surgery included some bladder adhesions in the setting of her prior C-sections.  Posterior cul-de-sac was described as "clear".  Normal-appearing bilateral tubes and ovaries.  There is not mention of significant pelvic adhesive disease.  There is not mention of any endometriosis within the pelvis.  It is unclear to me by the operative report why a supracervical hysterectomy was performed.   The report shows proliferative endometrium, no hyperplasia or atypia.  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  Bilateral fallopian tubes without significant pathology.  Abdominal wall mass that had been excised showed benign fibrous tissue and skeletal muscle with fibrous and reactive changes.  No endometriosis, atypia or malignancy.   02/04/23: Pelvic ultrasound Surgically absent. A septated hypoechoic region is identified in the region of the vaginal cuff measuring 1.4 x 1.2 x 0.9 cm and another adjacent hypoechoic region measures 1.2 x 1.1 x 0.8 cm. No internal blood flow.   03/09/23: Robotic-assisted trachelectomy, lysis of adhesions, unavoidable cystotomy and primary repair, cystoscopy, left ovarian cystectomies x2 Cystoscopy with ureteral ICG injection (Dr. Liliane Shi) Findings: On EUA, normal cervix.  On intra-abdominal entry, normal upper abdominal survey.  Normal small and large bowel.  Omentum with some filmy adhesions to the left pelvic sidewall and left aspect of the bladder.  Bilateral ovaries adherent to the pelvic sidewalls, she was taken.  Left ovary with 2 cysts 1 approximately 1 cm and the other 2 cm.  Both simple in appearance.  Uterus surgically absent.  Cervix not initially visible secondary to bladder adherent over the cervix.  Only the left ureter was noted to have ICG in infrared.  The bladder was quite adherent to the cervix superiorly and anteriorly.  Bladder was backfilled to help identify the contours of the bladder.  Ultimately, the cervix was transected in an effort to mobilize the bladder  without injuring it.  During removal of the portion of cervix adherent to the bladder itself, there was unavoidable cystotomy along the midline cephalad to the trigone and ureteral orifices.  This was repaired in multiple layers.  Repair was watertight and cystoscopy at the end of the procedure noted no suture in the bladder and repair more than 1 cm from each ureteral orifice.  Good efflux noted from bilateral ureteral orifices.  The cervix itself had multiple nabothian cysts.  It was sent for frozen section with nothing to freeze gross examination.    Seen on 8/9 for post-op follow-up. Small area along midportion of the cuff had opened.    Seen on 8/29 after using vaginal estrogen with almost completely closed vaginal cuff noted.  Seen 9/20, cuff healing well, treated empirically for BV.  Interval History: Reports overall doing.  Continues to struggle with urinary frequency as well as incontinence.  Notes that incontinence is maybe slightly worse since surgery.  Denies any further discharge after taking Flagyl.  Denies any vaginal bleeding or pelvic pain.  Past Medical/Surgical History: Past Medical History:  Diagnosis Date   Anemia    Asthma    TID use currently 2.2024  Endometriosis    Tobacco abuse     Past Surgical History:  Procedure Laterality Date   BACK SURGERY     carpel tunnel     CESAREAN SECTION     x3   ENDOMETRIAL ABLATION     EYE SURGERY N/A    [employer] 11/30/2020   OTHER SURGICAL HISTORY  2013   removal of scar tissue (endometriosis) from Pfannenstiel incision   PARTIAL HYSTERECTOMY  2017   cervix, ovaries left in situ   SPINE SURGERY N/A    [facility] 11/30/2020   TUBAL LIGATION N/A    [facility] 11/30/2020, at time of last section    Family History  Problem Relation Age of Onset   Breast cancer Mother    Heart attack Mother    Hypertension Mother    Diabetes Sister    Breast cancer Maternal Grandmother    Dementia Paternal Grandmother    Colon cancer  Neg Hx    Esophageal cancer Neg Hx    Stomach cancer Neg Hx    Colon polyps Neg Hx    Pancreatic cancer Neg Hx     Social History   Socioeconomic History   Marital status: Married    Spouse name: Juline Sanderford   Number of children: 3   Years of education: Associates degree   Highest education level: Associate degree: occupational, Scientist, product/process development, or vocational program  Occupational History   Occupation: Clinical biochemist Rep    Comment: [employer]  Tobacco Use   Smoking status: Every Day    Current packs/day: 0.50    Average packs/day: 0.5 packs/day for 25.1 years (12.6 ttl pk-yrs)    Types: Cigarettes    Start date: 12/03/2000    Passive exposure: Current   Smokeless tobacco: Never  Vaping Use   Vaping status: Never Used  Substance and Sexual Activity   Alcohol use: Yes    Alcohol/week: 2.0 standard drinks of alcohol    Types: 2 Glasses of wine per week    Comment: occasionally   Drug use: Never   Sexual activity: Yes    Partners: Male    Birth control/protection: Surgical    Comment: supracervical hysterectomy, menarche 42yo, sexual debut 42yo  Other Topics Concern   Not on file  Social History Narrative   Not on file   Social Determinants of Health   Financial Resource Strain: Medium Risk (05/06/2023)   Overall Financial Resource Strain (CARDIA)    Difficulty of Paying Living Expenses: Somewhat hard  Food Insecurity: Food Insecurity Present (05/06/2023)   Hunger Vital Sign    Worried About Running Out of Food in the Last Year: Sometimes true    Ran Out of Food in the Last Year: Sometimes true  Transportation Needs: No Transportation Needs (05/06/2023)   PRAPARE - Administrator, Civil Service (Medical): No    Lack of Transportation (Non-Medical): No  Physical Activity: Insufficiently Active (05/06/2023)   Exercise Vital Sign    Days of Exercise per Week: 3 days    Minutes of Exercise per Session: 20 min  Stress: No Stress Concern Present (05/06/2023)   [employer] of Occupational Health - Occupational Stress Questionnaire    Feeling of Stress : Only a little  Social Connections: Moderately Integrated (05/06/2023)   Social Connection and Isolation Panel [NHANES]    Frequency of Communication with Friends and Family: More than three times a week    Frequency of Social Gatherings with Friends and Family: Once a week  Attends Religious Services: More than 4 times per year    Active Member of Clubs or Organizations: No    Attends Engineer, structural: Not on file    Marital Status: Married    Current Medications:  Current Outpatient Medications:    acetaminophen (TYLENOL) 500 MG tablet, Take 1,000 mg by mouth every 6 (six) hours as needed for moderate pain., Disp: , Rfl:    albuterol (VENTOLIN HFA) 108 (90 Base) MCG/ACT inhaler, TAKE 2 PUFFS BY MOUTH EVERY 6 HOURS AS NEEDED FOR WHEEZE OR SHORTNESS OF BREATH, Disp: 18 g, Rfl: 2   Albuterol-Budesonide (AIRSUPRA) 90-80 MCG/ACT AERO, Inhale 2 puffs into the lungs every 6 (six) hours as needed. RESCUE INHALER, Disp: 5.9 g, Rfl: 11   Blood Pressure KIT, Use blood pressure monitor and cuff to check blood pressure at least once daily., Disp: 1 kit, Rfl: 0   conjugated estrogens (PREMARIN) vaginal cream, Place 1 Applicatorful vaginally 3 (three) times a week., Disp: 42.5 g, Rfl: 1   cyclobenzaprine (FLEXERIL) 10 MG tablet, TAKE 1/2 TO 1 TABLET BY MOUTH DAILY AT BEDTIME AS NEEDED FOR MUSCLE PAIN, Disp: 30 tablet, Rfl: 2   fluticasone (FLONASE) 50 MCG/ACT nasal spray, SPRAY 2 SPRAYS INTO EACH NOSTRIL EVERY DAY, Disp: 16 mL, Rfl: 3   fluticasone-salmeterol (ADVAIR) 100-50 MCG/ACT AEPB, Inhale 1 puff into the lungs 2 (two) times daily., Disp: 14 each, Rfl: 3   loratadine (CLARITIN) 10 MG tablet, Take 1 tablet (10 mg total) by mouth daily., Disp: 90 tablet, Rfl: 3   montelukast (SINGULAIR) 10 MG tablet, TAKE 1 TABLET BY MOUTH EVERYDAY AT BEDTIME, Disp: 90 tablet, Rfl: 3   mupirocin ointment  (BACTROBAN) 2 %, Apply 1 Application topically daily as needed (wound care)., Disp: , Rfl:    senna-docusate (SENOKOT-S) 8.6-50 MG tablet, Take 1 tablet by mouth at bedtime as needed for mild constipation or moderate constipation., Disp: 30 tablet, Rfl: 5  Review of Systems: Denies appetite changes, fevers, chills, fatigue, unexplained weight changes. Denies hearing loss, neck lumps or masses, mouth sores, ringing in ears or voice changes. Denies cough or wheezing.  Denies shortness of breath. Denies chest pain or palpitations. Denies leg swelling. Denies abdominal distention, pain, blood in stools, constipation, diarrhea, nausea, vomiting, or early satiety. Denies pain with intercourse, dysuria, frequency, hematuria or incontinence. Denies hot flashes, pelvic pain, vaginal bleeding or vaginal discharge.   Denies joint pain, back pain or muscle pain/cramps. Denies itching, rash, or wounds. Denies dizziness, headaches, numbness or seizures. Denies swollen lymph nodes or glands, denies easy bruising or bleeding. Denies anxiety, depression, confusion, or decreased concentration.  Physical Exam: BP (!) 150/87 (BP Location: Right Arm) Comment: notified doctor  Pulse 87   Temp 98.4 F (36.9 C)   Resp 20   Ht 5\' 9"  (1.753 m)   Wt 237 lb 9.6 oz (107.8 kg)   SpO2 100%   BMI 35.09 kg/m  General: Alert, oriented, no acute distress. HEENT: Normocephalic, atraumatic, sclera anicteric. Chest: Unlabored breathing on room air.   GU: Normal appearing external genitalia without erythema, excoriation, or lesions.  Speculum exam reveals cuff is healing well, some suture still visible.  Cuff completely intact.  Bimanual exam reveals intact.  Laboratory & Radiologic Studies: None new  Assessment & Plan: Kimberly Armstrong is a 42 y.o. woman with cycle pelvic pain after supracervical hysterectomy now s/p robotic trachelectomy, left ovarian cystectomy.  Partial dehiscence of the vaginal cuff  diagnosed on 8/9.   The patient  is doing very well.  Cuff is completely healed.  Discussed released back to heavy lifting with continued pelvic rest for an additional 4 weeks.  Discussed to her urinary frequency and incontinence symptoms.  Incontinence predated surgery although has slightly worsened.  She is working on [employer].  Discussed trial of Azo.  Also discussed getting her back into see pelvic floor physical therapy, new referral placed today.  She and I will have a phone visit in a couple of months.  If symptoms continue without improvement, discussed referral to urology or urogynecology.  18 minutes of total time was spent for this patient encounter, including preparation, face-to-face counseling with the patient and coordination of care, and documentation of the encounter.  Eugene Garnet, MD  Division of Gynecologic Oncology  Department of Obstetrics and Gynecology  [facility]

## 2023-06-13 ENCOUNTER — Encounter: Payer: Self-pay | Admitting: Physical Therapy

## 2023-06-13 ENCOUNTER — Other Ambulatory Visit: Payer: Self-pay

## 2023-06-13 ENCOUNTER — Ambulatory Visit: Payer: Medicaid Other | Attending: Gynecologic Oncology | Admitting: Physical Therapy

## 2023-06-13 DIAGNOSIS — R293 Abnormal posture: Secondary | ICD-10-CM | POA: Insufficient documentation

## 2023-06-13 DIAGNOSIS — T81328D Disruption or dehiscence of closure of other specified internal operation (surgical) wound, subsequent encounter: Secondary | ICD-10-CM | POA: Diagnosis not present

## 2023-06-13 DIAGNOSIS — R279 Unspecified lack of coordination: Secondary | ICD-10-CM | POA: Insufficient documentation

## 2023-06-13 DIAGNOSIS — M6281 Muscle weakness (generalized): Secondary | ICD-10-CM | POA: Insufficient documentation

## 2023-06-13 DIAGNOSIS — M62838 Other muscle spasm: Secondary | ICD-10-CM | POA: Diagnosis present

## 2023-06-13 DIAGNOSIS — R32 Unspecified urinary incontinence: Secondary | ICD-10-CM | POA: Insufficient documentation

## 2023-06-13 DIAGNOSIS — R350 Frequency of micturition: Secondary | ICD-10-CM | POA: Diagnosis not present

## 2023-06-13 NOTE — Patient Instructions (Signed)
Bladder Irritants  Certain foods and beverages can be irritating to the bladder.  Avoiding these irritants may decrease your symptoms of urinary urgency, frequency or bladder pain.  Even reducing your intake can help with your symptoms.  Not everyone is sensitive to all bladder irritants, so you may consider focusing on one irritant at a time, removing or reducing your intake of that irritant for 7-10 days to see if this change helps your symptoms.  Water intake is also very important.  Below is a list of bladder irritants.  Drinks: alcohol, carbonated beverages, caffeinated beverages such as coffee and tea, drinks with artificial sweeteners, citrus juices, apple juice, tomato juice  Foods: tomatoes and tomato based foods, spicy food, sugar and artificial sweeteners, vinegar, chocolate, raw onion, apples, citrus fruits, pineapple, cranberries, tomatoes, strawberries, plums, peaches, cantaloupe  Other: acidic urine (too concentrated) - see water intake info below  Substitutes you can try that are NOT irritating to the bladder: cooked onion, pears, papayas, sun-brewed decaf teas, watermelons, non-citrus herbal teas, apricots, kava and low-acid instant drinks (Postum).    WATER INTAKE: Remember to drink lots of water (aim for fluid intake of half your body weight with 2/3 of fluids being water).  You may be limiting fluids due to fear of leakage, but this can actually worsen urgency symptoms due to highly concentrated urine.  Water helps balance the pH of your urine so it doesn't become too acidic - acidic urine is a bladder irritant!   Urge Incontinence  Ideal urination frequency is every 2-4 wakeful hours, which equates to 5-8 times within a 24-hour period.   Urge incontinence is leakage that occurs when the bladder muscle contracts, creating a sudden need to go before getting to the bathroom.   Going too often when your bladder isn't actually full can disrupt the body's automatic signals to  store and hold urine longer, which will increase urgency/frequency.  In this case, the bladder "is running the show" and strategies can be learned to retrain this pattern.   One should be able to control the first urge to urinate, at around .  The bladder can hold up to a "grande latte," or . To help you gain control, practice the Urge Drill below when urgency strikes.  This drill will help retrain your bladder signals and allow you to store and hold urine longer.  The overall goal is to stretch out your time between voids to reach a more manageable voiding schedule.    Practice your "quick flicks" often throughout the day (each waking hour) even when you don't need feel the urge to go.  This will help strengthen your pelvic floor muscles, making them more effective in controlling leakage.  Urge Drill  When you feel an urge to go, follow these steps to regain control: Stop what you are doing and be still Take one deep breath, directing your air into your abdomen Think an affirming thought, such as "I've got this." Do 5 quick flicks of your pelvic floor Walk with control to the bathroom to void, or delay voiding

## 2023-06-13 NOTE — Therapy (Addendum)
 OUTPATIENT PHYSICAL THERAPY FEMALE PELVIC EVALUATION   Patient Name: Kimberly Armstrong MRN: [id number] DOB:[birth year], 42 y.o., female Today's Date: 06/13/2023  END OF SESSION:  PT End of Session - 06/13/23 1101     Visit Number 1    Date for PT Re-Evaluation 10/14/23    Authorization Type [place] MEDICAID [employer]    PT Start Time 1100    PT Stop Time 1130    PT Time Calculation (min) 30 min    Activity Tolerance Patient tolerated treatment well    Behavior During Therapy WFL for tasks assessed/performed             Past Medical History:  Diagnosis Date   Anemia    Asthma    TID use currently 2.2024   Endometriosis    Tobacco abuse    Past Surgical History:  Procedure Laterality Date   BACK SURGERY     carpel tunnel     CESAREAN SECTION     x3   ENDOMETRIAL ABLATION     EYE SURGERY N/A    [employer] 11/30/2020   OTHER SURGICAL HISTORY  2013   removal of scar tissue (endometriosis) from Pfannenstiel incision   PARTIAL HYSTERECTOMY  2017   cervix, ovaries left in situ   SPINE SURGERY N/A    [facility] 11/30/2020   TUBAL LIGATION N/A    [facility] 11/30/2020, at time of last section   Patient Active Problem List   Diagnosis Date Noted   Cervical mass 03/09/2023   Cyst of left ovary 03/09/2023   Elevated blood pressure reading without diagnosis of hypertension 02/03/2023   Impaired fasting glucose 11/03/2022   Vitamin D deficiency 11/03/2022   Ovarian hypertrophy 08/18/2022   Coccyx pain 06/24/2022   Renal mass, left 10/05/2021   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   Raynaud's phenomenon without gangrene 10/05/2021   Seasonal allergies 09/07/2021   Cold sore 09/07/2021   Body mass index (BMI) of 33.0-33.9 in adult 03/15/2021   Tobacco use disorder, continuous 12/03/2020   Moderate persistent asthma October 12, 1980    PCP: Saralyn Pilar, PA  REFERRING PROVIDER: Carver Fila, MD   REFERRING DIAG: R32 (ICD-10-CM)  - Urinary incontinence, unspecified type R35.0 (ICD-10-CM) - Urinary frequency T81.328D (ICD-10-CM) - Dehiscence of vaginal cuff, subsequent encounter  THERAPY DIAG:  Muscle weakness (generalized)  Unspecified lack of coordination  Abnormal posture  Other muscle spasm  Rationale for Evaluation and Treatment: Rehabilitation  ONSET DATE: 8 years ago - got worse ~3 years ago  SUBJECTIVE:  SUBJECTIVE STATEMENT: Was having irregular pain at low abdomen/pelvis around 8 years ago but went away and returned around 3 years ago. With surgical observation found to not have endo, but did have partial hysterectomy in 2017.  Cervix and ovaries were left but pain remained, 03/09/23 had cervix removed and pain has been resolved.  Does have increased urinary urgency and frequency, leakage with sneezing and coughing and full bladder.    Fluid intake: Yes: does limit if unable to go to the bathroom easily; unsure about fluid but a glass or two of water, 16oz of coffee in am    PAIN:  Are you having pain? No   PRECAUTIONS: Other: 4 weeks of pelvic rest (10/10)  RED FLAGS: None   WEIGHT BEARING RESTRICTIONS: No  FALLS:  Has patient fallen in last 6 months? No  LIVING ENVIRONMENT: Lives with: lives with their family Lives in: House/apartment   OCCUPATION: [employer] full time (urinary urgency has limited this a lot as she has to time this with her breaks), part time with church  PLOF: Independent  PATIENT GOALS: to have less urinary frequency and leakage  PERTINENT HISTORY:  C-section 2017, tubal ligations, IUD placed and expelled due to several heavy menses, later partial hysterectomy in 2017.  pelvic rest for an additional 4 weeks. (10/10)   Sexual abuse: No  BOWEL MOVEMENT: Pain with bowel  movement: No Type of bowel movement:Type (Bristol Stool Scale) 4, Frequency once daily to every other day, and Strain No Fully empty rectum: Yes:   Leakage: No Pads: No Fiber supplement: Yes: fiber daily  URINATION: Pain with urination: No Fully empty bladder: No Stream: Strong and Weak Urgency: Yes: all voids are urgent Frequency: 30x in 24 hours period; 2-3x night Leakage: Urge to void, Coughing, and Sneezing Pads: Yes: sometimes  INTERCOURSE: Pain with intercourse:  not pain; no dryness Ability to have vaginal penetration:  Yes:   Climax: not painful Marinoff Scale: 0/3  PREGNANCY: Vaginal deliveries 0 Tearing No C-section deliveries 3 Currently pregnant No  PROLAPSE: None   OBJECTIVE:  Note: Objective measures were completed at Evaluation unless otherwise noted.  DIAGNOSTIC FINDINGS:  09/10/21: Pelvic ultrasound reveals surgically absent uterus.  In the posterior aspect of the vaginal cuff is a poorly defined hypoechoic nodule measuring 2.2 x 1.5 x 1.7 cm that is incompletely characterized on transabdominal exam.  There is a slightly more hyperechoic nodule in the superior aspect of the vaginal cuff measuring 1.5 x 1.1 x 1.4 cm that causes mass mild effect on the bladder base.  Right ovary measures up to 4.7 cm without dominant cyst lesion.  Left ovary measures up to 6.1 cm again without dominant cystic lesion.  Both ovaries noted to be prominent in size although no definitive cystic or solid lesions seen. 09/17/2021: MRI of the pelvis shows 1.9 x 1.7 x 1.8 cm complex cystic mass in the central cervical stroma.  Cervical canal is not well-demonstrated but this lesion may be related to the canal or in the cervical stroma.  Could represent tiny cluster of multiple cyst or single multicystic lesion.  Large nabothian cyst would also be a consideration.  Cystic cervicitis is in the differential and adenoma malignancy cannot be excluded.  Second smaller nodular component along the  anterior wall of the cervix is immediately anterior to the above lesion and measures up to 1.3 cm.  It generates some mass effect on the posterior wall of the urinary bladder, does not appear overtly aggressive and  may simply represent scar.  Unremarkable appearance of the ovaries. 02/04/23: Pelvic ultrasound Surgically absent. A septated hypoechoic region is identified in the region of the vaginal cuff measuring 1.4 x 1.2 x 0.9 cm and another adjacent hypoechoic region measures 1.2 x 1.1 x 0.8 cm. No internal blood flow.   03/09/23: Robotic-assisted trachelectomy, lysis of adhesions, unavoidable cystotomy and primary repair, cystoscopy, left ovarian cystectomies x2 Cystoscopy with ureteral ICG injection (Dr. Liliane Shi) Findings: On EUA, normal cervix.  On intra-abdominal entry, normal upper abdominal survey.  Normal small and large bowel.  Omentum with some filmy adhesions to the left pelvic sidewall and left aspect of the bladder.  Bilateral ovaries adherent to the pelvic sidewalls, she was taken.  Left ovary with 2 cysts 1 approximately 1 cm and the other 2 cm.  Both simple in appearance.  Uterus surgically absent.  Cervix not initially visible secondary to bladder adherent over the cervix.  Only the left ureter was noted to have ICG in infrared.  The bladder was quite adherent to the cervix superiorly and anteriorly.  Bladder was backfilled to help identify the contours of the bladder.  Ultimately, the cervix was transected in an effort to mobilize the bladder without injuring it.  During removal of the portion of cervix adherent to the bladder itself, there was unavoidable cystotomy along the midline cephalad to the trigone and ureteral orifices.  This was repaired in multiple layers.  Repair was watertight and cystoscopy at the end of the procedure noted no suture in the bladder and repair more than 1 cm from each ureteral orifice.  Good efflux noted from bilateral ureteral orifices.  The cervix itself had  multiple nabothian cysts.  It was sent for frozen section with nothing to freeze gross examination.    Seen on 8/9 for post-op follow-up. Small area along midportion of the cuff had opened.    Seen on 8/29 after using vaginal estrogen with almost completely closed vaginal cuff noted.   Seen 9/20, cuff healing well, treated empirically for BV  PATIENT SURVEYS:    [id number]  COGNITION: Overall cognitive status: Within functional limits for tasks assessed     SENSATION: Light touch: Appears intact Proprioception: Appears intact  MUSCLE LENGTH: Bil hamstrings and adductors limited by 25%     GAIT: WFL  POSTURE: rounded shoulders and posterior pelvic tilt  PELVIC ALIGNMENT: WFL  LUMBARAROM/PROM:  A/PROM A/PROM  eval  Flexion Limited 75%  Extension WFL  Right lateral flexion Limited 50%  Left lateral flexion Limited 50%  Right rotation Limited 50%  Left rotation Limited 50%   Reports no pain but "just feels tight"  (Blank rows = not tested)  LOWER EXTREMITY ROM:  WFL  LOWER EXTREMITY MMT:  Bil hips grossly 4/5; knees 5/5  PALPATION:   General  no TTP, mild fascial restrictions in Lt abdominal quadrants and over bladder and c-section scarring but denied pain  Decreased core strength noted with mild doming at abdomen with MMT of bil hips and breath holding noted with resistance                 External Perineal Exam deferred as pt on pelvic rest, will assess once cleared                             Internal Pelvic Floor deferred as pt on pelvic rest, will assess once cleared  Patient confirms identification and approves PT to assess  internal pelvic floor and treatment No  PELVIC MMT:   MMT eval  Vaginal   Internal Anal Sphincter   External Anal Sphincter   Puborectalis   Diastasis Recti   (Blank rows = not tested)        TONE: deferred as pt on pelvic rest, will assess once cleared  PROLAPSE: deferred as pt on pelvic rest, will assess once  cleared  TODAY'S TREATMENT:                                                                                                                              DATE:   06/13/23 EVAL Examination completed, findings reviewed, pt educated on POC, bladder irritants, and urge drill. Pt motivated to participate in PT and agreeable to attempt recommendations.    If treatment provided at initial evaluation, no treatment charged due to lack of authorization.       PATIENT EDUCATION:  Education details: bladder irritants, and urge drill Person educated: Patient Education method: Explanation, Demonstration, Tactile cues, Verbal cues, and Handouts Education comprehension: verbalized understanding, returned demonstration, verbal cues required, and needs further education  HOME EXERCISE PROGRAM: To be given  ASSESSMENT:  CLINICAL IMPRESSION: Patient is a 42 y.o. female  who was seen today for physical therapy evaluation and treatment for increased urinary urgency and frequency. Pt states she has felt better since surgery from pain but has had these symptoms since this time. Pt continues to be on 4 weeks of pelvic rest. Pt had complicated recovery of   supracervical hysterectomy now s/p robotic trachelectomy, left ovarian cystectomy and Partial dehiscence of the vaginal cuff diagnosed on 8/9. Per referring providers note 06/09/23, vaginal cuff has healed completely and has 4 weeks of pelvic rest. Pt very bothered and limited by her increased urinary frequency as this limits her ability to complete job tasks because she is going to the bathroom so much, usually several times an hour. Pt found to have decreased flexibility at spine and hips, decreased core and mild decreased hip strength. Internal pelvic floor assessment deferred at this time until medically cleared. Pt educated on urge drill and bladder irritants. Pt would benefit from additional PT to further address deficits.    OBJECTIVE IMPAIRMENTS: decreased  activity tolerance, decreased coordination, decreased endurance, decreased mobility, decreased strength, increased fascial restrictions, impaired flexibility, improper body mechanics, and postural dysfunction.   ACTIVITY LIMITATIONS: continence  PARTICIPATION LIMITATIONS: interpersonal relationship and community activity  PERSONAL FACTORS: Time since onset of injury/illness/exacerbation and 1 comorbidity: medical history and x3 c-sections  are also affecting patient's functional outcome.   REHAB POTENTIAL: Good  CLINICAL DECISION MAKING: Evolving/moderate complexity  EVALUATION COMPLEXITY: Moderate   GOALS: Goals reviewed with patient? Yes  SHORT TERM GOALS: Target date: 07/11/23  Pt to be I with HEP.  Baseline: Goal status: INITIAL  2.  Pt will have 25% less urgency due to bladder retraining and strengthening to improve QOL with ability to complete job  tasks Baseline:  Goal status: INITIAL  3.  Pt to report improved time between bladder voids to at least 1 hour for improved QOL with decreased urinary frequency.   Baseline:  Goal status: INITIAL  4.  Pt to demonstrate at least 3/5 pelvic floor strength for improved pelvic stability and decreased strain at pelvic floor/ decrease leakage.  Baseline:  Goal status: INITIAL   LONG TERM GOALS: Target date: 10/14/23  Pt to be I with advanced HEP.  Baseline:  Goal status: INITIAL  2.  Pt to demonstrate at least 5/5 bil hip strength for improved pelvic stability and functional squats without leakage.  Baseline:  Goal status: INITIAL  3.  Pt will have 50% less urgency due to bladder retraining and strengthening to improve QOL with ability to complete job tasks Baseline:  Goal status: INITIAL  4.  Pt to report improved time between bladder voids to at least 3 hours for improved QOL with decreased urinary frequency.   Baseline:  Goal status: INITIAL  5.  Pt to demonstrate improved coordination of pelvic floor and breathing  mechanics with 10# squat with appropriate synergistic patterns to decrease pain and leakage at least 75% of the time.      Baseline:  Goal status: INITIAL  6.  Pt to report decreased urinary leakage by at least 50% for improved QOL and decreased reliance on pads/  Baseline:  Goal status: INITIAL  PLAN:  PT FREQUENCY: 1x/week  PT DURATION:  8 sessions  PLANNED INTERVENTIONS: 97110-Therapeutic exercises, 97530- Therapeutic activity, 97112- Neuromuscular re-education, 97535- Self Care, 44010- Manual therapy, [phone]- Aquatic Therapy, 97016- Vasopneumatic device, Patient/Family education, Taping, Dry Needling, Joint mobilization, Spinal mobilization, Scar mobilization, Cryotherapy, Moist heat, and Biofeedback  PLAN FOR NEXT SESSION: internal if cleared and pt consents, breathing mechanics, voiding mechanics, bladder retraining, urge drill, knack, coordination of pelvic floor with breathing and with/out exercises, pressure management,    Otelia Sergeant, PT, DPT 06/13/2411:35 PM  [facility] [address] [place], [place] [address] Phone # [phone] Fax [phone]   PHYSICAL THERAPY DISCHARGE SUMMARY  Visits from Start of Care: 1  Current functional level related to goals / functional outcomes: Unable to formally reassess as pt not returning since eval   Remaining deficits: Unable to formally reassess    Education / Equipment: HEP   Patient agrees to discharge. Patient goals were not met. Patient is being discharged due to not returning since the last visit.  Otelia Sergeant, PT, DPT 11/29/2508:07 AM

## 2023-06-21 ENCOUNTER — Ambulatory Visit (INDEPENDENT_AMBULATORY_CARE_PROVIDER_SITE_OTHER): Payer: Medicaid Other | Admitting: Family Medicine

## 2023-06-21 ENCOUNTER — Encounter: Payer: Self-pay | Admitting: Family Medicine

## 2023-06-21 VITALS — BP 117/80 | HR 79 | Resp 18 | Ht 69.0 in | Wt 238.0 lb

## 2023-06-21 DIAGNOSIS — Z8742 Personal history of other diseases of the female genital tract: Secondary | ICD-10-CM | POA: Diagnosis not present

## 2023-06-21 DIAGNOSIS — N3941 Urge incontinence: Secondary | ICD-10-CM | POA: Diagnosis not present

## 2023-06-21 DIAGNOSIS — Z9889 Other specified postprocedural states: Secondary | ICD-10-CM | POA: Diagnosis not present

## 2023-06-21 NOTE — Progress Notes (Signed)
Established Patient Office Visit  Subjective   Patient ID: Kimberly Armstrong, female    DOB: 11-Nov-1980  Age: 42 y.o. MRN: [id number]  Chief Complaint  Patient presents with   forms    HPI Kimberly Armstrong is a 42 y.o. female presenting today for follow up of postoperative recovery and subsequent complications.  She notes improvement since our last appointment, and at most recent appointment with GYN on 06/09/2023, examination revealed that vaginal cuff has fully healed.  She is cleared for heavy lifting but is still on pelvic rest until 07/10/2023.  She continues to experience urinary frequency and urgency.  She would like to return to work, but notes that she will need some restrictions placed in order to allow her to use the bathroom when needed due to urge incontinence.  She is scheduled for pelvic floor physical therapy and has had her initial consultation already.  She will also have a follow-up with gynecology on 08/26/2023 to assess progress and determine if pelvic floor physical therapy alone is adequate, or if a referral to urology is necessary.  Outpatient Medications Prior to Visit  Medication Sig   acetaminophen (TYLENOL) 500 MG tablet Take 1,000 mg by mouth every 6 (six) hours as needed for moderate pain.   albuterol (VENTOLIN HFA) 108 (90 Base) MCG/ACT inhaler TAKE 2 PUFFS BY MOUTH EVERY 6 HOURS AS NEEDED FOR WHEEZE OR SHORTNESS OF BREATH   Albuterol-Budesonide (AIRSUPRA) 90-80 MCG/ACT AERO Inhale 2 puffs into the lungs every 6 (six) hours as needed. RESCUE INHALER   Blood Pressure KIT Use blood pressure monitor and cuff to check blood pressure at least once daily.   conjugated estrogens (PREMARIN) vaginal cream Place 1 Applicatorful vaginally 3 (three) times a week.   cyclobenzaprine (FLEXERIL) 10 MG tablet TAKE 1/2 TO 1 TABLET BY MOUTH DAILY AT BEDTIME AS NEEDED FOR MUSCLE PAIN   fluticasone (FLONASE) 50 MCG/ACT nasal spray SPRAY 2 SPRAYS INTO EACH NOSTRIL  EVERY DAY   fluticasone-salmeterol (ADVAIR) 100-50 MCG/ACT AEPB Inhale 1 puff into the lungs 2 (two) times daily.   loratadine (CLARITIN) 10 MG tablet Take 1 tablet (10 mg total) by mouth daily.   montelukast (SINGULAIR) 10 MG tablet TAKE 1 TABLET BY MOUTH EVERYDAY AT BEDTIME   mupirocin ointment (BACTROBAN) 2 % Apply 1 Application topically daily as needed (wound care).   senna-docusate (SENOKOT-S) 8.6-50 MG tablet Take 1 tablet by mouth at bedtime as needed for mild constipation or moderate constipation.   No facility-administered medications prior to visit.    ROS Negative unless otherwise noted in HPI   Objective:     BP 117/80 (BP Location: Left Arm, Patient Position: Sitting, Cuff Size: Normal)   Pulse 79   Resp 18   Ht 5\' 9"  (1.753 m)   Wt 238 lb (108 kg)   SpO2 99%   BMI 35.15 kg/m   Physical Exam Constitutional:      General: She is not in acute distress.    Appearance: Normal appearance.  HENT:     Head: Normocephalic and atraumatic.  Pulmonary:     Effort: Pulmonary effort is normal. No respiratory distress.  Musculoskeletal:     Cervical back: Normal range of motion.  Neurological:     General: No focal deficit present.     Mental Status: She is alert and oriented to person, place, and time. Mental status is at baseline.  Psychiatric:        Mood and Affect: Mood normal.  Thought Content: Thought content normal.        Judgment: Judgment normal.     Assessment & Plan:  S/P ovarian cystectomy  Urge incontinence  Patient continues to recover, encouraged to continue with pelvic floor physical therapy to improve urge incontinence.  Will provide noticed that she may return to work with restrictions: 4 to 6-hour shift daily, bathroom breaks as needed for urge incontinence.  Follow-up with GYN as planned on 08/26/2023.  Follow-up with me beginning of February 2025 to assess recovery and clearance to return to full work responsibilities.  Letter faxed to  [facility], other packet will be faxed to provided fax number.  Return in about 3 months (around 10/03/2023) for follow-up for urge incontinence/work restrictions .    Melida Quitter, PA

## 2023-06-23 ENCOUNTER — Ambulatory Visit: Payer: Self-pay | Admitting: Physical Therapy

## 2023-06-28 ENCOUNTER — Encounter: Payer: Self-pay | Admitting: Physical Therapy

## 2023-06-29 ENCOUNTER — Ambulatory Visit: Payer: Medicaid Other | Admitting: Family Medicine

## 2023-07-04 ENCOUNTER — Encounter: Payer: Self-pay | Admitting: Family Medicine

## 2023-07-12 ENCOUNTER — Emergency Department (HOSPITAL_BASED_OUTPATIENT_CLINIC_OR_DEPARTMENT_OTHER)
Admission: EM | Admit: 2023-07-12 | Discharge: 2023-07-12 | Disposition: A | Discharge status: Home / Self Care | Payer: Medicaid Other | Attending: Emergency Medicine | Admitting: Emergency Medicine

## 2023-07-12 ENCOUNTER — Telehealth: Payer: Medicaid Other | Admitting: Physician Assistant

## 2023-07-12 ENCOUNTER — Encounter (HOSPITAL_BASED_OUTPATIENT_CLINIC_OR_DEPARTMENT_OTHER): Payer: Self-pay

## 2023-07-12 ENCOUNTER — Other Ambulatory Visit: Payer: Self-pay

## 2023-07-12 ENCOUNTER — Telehealth: Payer: Medicaid Other

## 2023-07-12 ENCOUNTER — Ambulatory Visit (HOSPITAL_BASED_OUTPATIENT_CLINIC_OR_DEPARTMENT_OTHER)
Admission: RE | Admit: 2023-07-12 | Discharge: 2023-07-12 | Disposition: A | Discharge status: Home / Self Care | Payer: Medicaid Other | Source: Ambulatory Visit | Attending: Internal Medicine | Admitting: Internal Medicine

## 2023-07-12 ENCOUNTER — Other Ambulatory Visit: Payer: Self-pay | Admitting: Family Medicine

## 2023-07-12 ENCOUNTER — Emergency Department (HOSPITAL_BASED_OUTPATIENT_CLINIC_OR_DEPARTMENT_OTHER): Payer: Medicaid Other

## 2023-07-12 VITALS — BP 147/97 | HR 82 | Temp 98.3°F | Resp 20

## 2023-07-12 DIAGNOSIS — Z1152 Encounter for screening for COVID-19: Secondary | ICD-10-CM | POA: Diagnosis not present

## 2023-07-12 DIAGNOSIS — R079 Chest pain, unspecified: Secondary | ICD-10-CM

## 2023-07-12 DIAGNOSIS — R059 Cough, unspecified: Secondary | ICD-10-CM

## 2023-07-12 DIAGNOSIS — Z7951 Long term (current) use of inhaled steroids: Secondary | ICD-10-CM | POA: Diagnosis not present

## 2023-07-12 DIAGNOSIS — J4520 Mild intermittent asthma, uncomplicated: Secondary | ICD-10-CM

## 2023-07-12 DIAGNOSIS — R0602 Shortness of breath: Secondary | ICD-10-CM | POA: Diagnosis present

## 2023-07-12 DIAGNOSIS — J4541 Moderate persistent asthma with (acute) exacerbation: Secondary | ICD-10-CM | POA: Diagnosis not present

## 2023-07-12 DIAGNOSIS — R0781 Pleurodynia: Secondary | ICD-10-CM

## 2023-07-12 LAB — RESP PANEL BY RT-PCR (RSV, FLU A&B, COVID)  RVPGX2
Influenza A by PCR: NEGATIVE
Influenza B by PCR: NEGATIVE
Resp Syncytial Virus by PCR: NEGATIVE
SARS Coronavirus 2 by RT PCR: NEGATIVE

## 2023-07-12 LAB — CBC WITH DIFFERENTIAL/PLATELET
Abs Immature Granulocytes: 0.02 10*3/uL (ref 0.00–0.07)
Basophils Absolute: 0.1 10*3/uL (ref 0.0–0.1)
Basophils Relative: 1 %
Eosinophils Absolute: 0.2 10*3/uL (ref 0.0–0.5)
Eosinophils Relative: 3 %
HCT: 43 % (ref 36.0–46.0)
Hemoglobin: 14.9 g/dL (ref 12.0–15.0)
Immature Granulocytes: 0 %
Lymphocytes Relative: 33 %
Lymphs Abs: 2.2 10*3/uL (ref 0.7–4.0)
MCH: 31.2 pg (ref 26.0–34.0)
MCHC: 34.7 g/dL (ref 30.0–36.0)
MCV: 90.1 fL (ref 80.0–100.0)
Monocytes Absolute: 0.5 10*3/uL (ref 0.1–1.0)
Monocytes Relative: 8 %
Neutro Abs: 3.7 10*3/uL (ref 1.7–7.7)
Neutrophils Relative %: 55 %
Platelets: 195 10*3/uL (ref 150–400)
RBC: 4.77 MIL/uL (ref 3.87–5.11)
RDW: 12.1 % (ref 11.5–15.5)
WBC: 6.7 10*3/uL (ref 4.0–10.5)
nRBC: 0 % (ref 0.0–0.2)

## 2023-07-12 LAB — COMPREHENSIVE METABOLIC PANEL
ALT: 19 U/L (ref 0–44)
AST: 17 U/L (ref 15–41)
Albumin: 3.8 g/dL (ref 3.5–5.0)
Alkaline Phosphatase: 63 U/L (ref 38–126)
Anion gap: 8 (ref 5–15)
BUN: 8 mg/dL (ref 6–20)
CO2: 27 mmol/L (ref 22–32)
Calcium: 8.7 mg/dL — ABNORMAL LOW (ref 8.9–10.3)
Chloride: 103 mmol/L (ref 98–111)
Creatinine, Ser: 0.91 mg/dL (ref 0.44–1.00)
GFR, Estimated: >60 mL/min (ref >60)
Glucose, Bld: 98 mg/dL (ref 70–99)
Potassium: 3.8 mmol/L (ref 3.5–5.1)
Sodium: 138 mmol/L (ref 135–145)
Total Bilirubin: 0.7 mg/dL (ref <1.2)
Total Protein: 7 g/dL (ref 6.5–8.1)

## 2023-07-12 LAB — D-DIMER, QUANTITATIVE: D-Dimer, Quant: 0.29 ug{FEU}/mL (ref 0.00–0.50)

## 2023-07-12 LAB — HCG, SERUM, QUALITATIVE: Preg, Serum: NEGATIVE

## 2023-07-12 MED ORDER — ALBUTEROL SULFATE (2.5 MG/3ML) 0.083% IN NEBU
5.0000 mg | INHALATION_SOLUTION | Freq: Once | RESPIRATORY_TRACT | Status: AC
  Administered 2023-07-12: 5 mg via RESPIRATORY_TRACT
  Filled 2023-07-12: qty 6

## 2023-07-12 MED ORDER — PREDNISONE 20 MG PO TABS: ORAL_TABLET | ORAL | 0 refills | Status: DC

## 2023-07-12 MED ORDER — IPRATROPIUM BROMIDE 0.02 % IN SOLN
0.5000 mg | Freq: Once | RESPIRATORY_TRACT | Status: AC
  Administered 2023-07-12: 0.5 mg via RESPIRATORY_TRACT
  Filled 2023-07-12: qty 2.5

## 2023-07-12 MED ORDER — METHYLPREDNISOLONE SODIUM SUCC 125 MG IJ SOLR
125.0000 mg | Freq: Once | INTRAMUSCULAR | Status: AC
  Administered 2023-07-12: 125 mg via INTRAVENOUS
  Filled 2023-07-12: qty 2

## 2023-07-12 NOTE — ED Provider Notes (Signed)
[name] Provider Note   [name]: [id number] Arrival date & time: 07/12/23  1541     History  Chief Complaint  Patient presents with   Shortness of Breath    Kimberly Armstrong is a 42 y.o. female history of asthma, here presenting with shortness of breath.  Patient states that she has been short of breath and coughing for several days.  Patient states that it is worse with deep inspiration.  Patient has been using her albuterol with minimal relief.  Patient had a visit with urgent care and was sent in for rule out PE versus pneumonia.  Denies any recent travel or history of PE.  Patient is on birth control.  The history is provided by the patient.       Home Medications Prior to Admission medications   Medication Sig Start Date End Date Taking? Authorizing Provider  acetaminophen (TYLENOL) 500 MG tablet Take 1,000 mg by mouth every 6 (six) hours as needed for moderate pain.    [provider]  Albuterol-Budesonide (AIRSUPRA) 90-80 MCG/ACT AERO Inhale 2 puffs into the lungs every 6 (six) hours as needed. RESCUE INHALER 05/10/23   Saralyn Pilar A, PA  Blood Pressure KIT Use blood pressure monitor and cuff to check blood pressure at least once daily. 05/10/23   Melida Quitter, PA  conjugated estrogens (PREMARIN) vaginal cream Place 1 Applicatorful vaginally 3 (three) times a week. 04/08/23   Carver Fila, MD  cyclobenzaprine (FLEXERIL) 10 MG tablet TAKE 1/2 TO 1 TABLET BY MOUTH DAILY AT BEDTIME AS NEEDED FOR MUSCLE PAIN 02/16/23   Carlean Jews, NP  fluticasone (FLONASE) 50 MCG/ACT nasal spray SPRAY 2 SPRAYS INTO EACH NOSTRIL EVERY DAY 05/10/23   Saralyn Pilar A, PA  fluticasone-salmeterol (ADVAIR) 100-50 MCG/ACT AEPB Inhale 1 puff into the lungs 2 (two) times daily. 05/10/23   Melida Quitter, PA  loratadine (CLARITIN) 10 MG tablet Take 1 tablet (10 mg total) by mouth daily. 05/10/23   Saralyn Pilar A, PA   montelukast (SINGULAIR) 10 MG tablet TAKE 1 TABLET BY MOUTH EVERYDAY AT BEDTIME 12/02/22   Carlean Jews, NP  mupirocin ointment (BACTROBAN) 2 % Apply 1 Application topically daily as needed (wound care). 02/15/23   [provider]  senna-docusate (SENOKOT-S) 8.6-50 MG tablet Take 1 tablet by mouth at bedtime as needed for mild constipation or moderate constipation. 11/29/22   Carlean Jews, NP  VENTOLIN HFA 108 (90 Base) MCG/ACT inhaler INHALE 2 PUFFS EVERY 6 HOURS AS NEEDED FOR WHEEZE OR SHORTNESS OF BREATH 07/12/23   Saralyn Pilar A, PA      Allergies    Sulfa antibiotics    Review of Systems   Review of Systems  Respiratory:  Positive for shortness of breath.   All other systems reviewed and are negative.   Physical Exam Updated Vital Signs BP (!) 170/112 (BP Location: Left Arm)   Pulse 73   Temp 98.9 F (37.2 C) (Oral)   Resp (!) 22   Ht 5\' 9"  (1.753 m)   Wt 108.9 kg   SpO2 98%   BMI 35.44 kg/m  Physical Exam Vitals and nursing note reviewed.  Constitutional:      Comments: Slightly uncomfortable  HENT:     Head: Normocephalic.     Mouth/Throat:     Mouth: Mucous membranes are moist.     Pharynx: Oropharynx is clear.  Eyes:     Extraocular Movements: Extraocular  movements intact.     Pupils: Pupils are equal, round, and reactive to light.  Cardiovascular:     Rate and Rhythm: Normal rate and regular rhythm.  Pulmonary:     Effort: Pulmonary effort is normal.     Comments: Diminished breath sounds bilaterally.  No obvious wheezing. Abdominal:     General: Bowel sounds are normal.     Palpations: Abdomen is soft.  Musculoskeletal:        General: Normal range of motion.     Cervical back: Normal range of motion and neck supple.  Skin:    General: Skin is warm.     Capillary Refill: Capillary refill takes less than 2 seconds.  Neurological:     General: No focal deficit present.     Mental Status: She is oriented to person, place, and time.   Psychiatric:        Mood and Affect: Mood normal.        Behavior: Behavior normal.     ED Results / Procedures / Treatments   Labs (all labs ordered are listed, but only abnormal results are displayed) Labs Reviewed  RESP PANEL BY RT-PCR (RSV, FLU A&B, COVID)  [id number]  CBC WITH DIFFERENTIAL/PLATELET  COMPREHENSIVE METABOLIC PANEL  HCG, SERUM, QUALITATIVE  D-DIMER, QUANTITATIVE    EKG None  Radiology No results found.  Procedures Procedures    Medications Ordered in ED Medications  methylPREDNISolone sodium succinate (SOLU-MEDROL) 125 mg/2 mL injection 125 mg (has no administration in time range)  albuterol (PROVENTIL) (2.5 MG/3ML) 0.083% nebulizer solution 5 mg (has no administration in time range)  ipratropium (ATROVENT) nebulizer solution 0.5 mg (has no administration in time range)    ED Course/ Medical Decision Making/ A&P                                 Medical Decision Making Kimberly Armstrong is a 42 y.o. female here presenting with shortness of breath.  Patient had diminished breath sounds.  Concern for possible bronchitis versus pneumonia versus PE.  Plan to get CBC and CMP and D-dimer and chest x-ray and COVID test.  Will give Solu-Medrol and DuoNeb and reassess.  6:29 PM I reviewed patient's labs and they are unremarkable.  In particular D-dimer is negative.  COVID and flu and RSV are negative.  Chest x-ray did not show any obvious pneumonia.  Patient is feeling better after breathing treatment and Solu-Medrol.  She has improved air movements and still no wheezing.  She is never hypoxic.  Will discharge home with course of steroids.  She has albuterol at home.  Problems Addressed: Moderate persistent asthma with exacerbation: acute illness or injury  Amount and/or Complexity of Data Reviewed Labs: ordered. Decision-making details documented in ED Course. Radiology: ordered and independent interpretation performed. Decision-making details  documented in ED Course.  Risk Prescription drug management.    Final Clinical Impression(s) / ED Diagnoses Final diagnoses:  None    Rx / DC Orders ED Discharge Orders     None         Charlynne Pander, MD 07/12/23 [phone]

## 2023-07-12 NOTE — ED Notes (Signed)
RT assessed in triage. Patient took MDI in room right before I assessed. Slightly diminished in bases, rhonchi left upper. SAT 97%

## 2023-07-12 NOTE — Progress Notes (Signed)
Virtual Visit Consent   Kimberly Armstrong, you are scheduled for a virtual visit with a [facility] provider today. Just as with appointments in the office, your consent must be obtained to participate. Your consent will be active for this visit and any virtual visit you may have with one of our providers in the next 365 days. If you have a MyChart account, a copy of this consent can be sent to you electronically.  As this is a virtual visit, video technology does not allow for your provider to perform a traditional examination. This may limit your provider's ability to fully assess your condition. If your provider identifies any concerns that need to be evaluated in person or the need to arrange testing (such as labs, EKG, etc.), we will make arrangements to do so. Although advances in technology are sophisticated, we cannot ensure that it will always work on either your end or our end. If the connection with a video visit is poor, the visit may have to be switched to a telephone visit. With either a video or telephone visit, we are not always able to ensure that we have a secure connection.  By engaging in this virtual visit, you consent to the provision of healthcare and authorize for your insurance to be billed (if applicable) for the services provided during this visit. Depending on your insurance coverage, you may receive a charge related to this service.  I need to obtain your verbal consent now. Are you willing to proceed with your visit today? Kimberly Armstrong has provided verbal consent on 07/12/2023 for a virtual visit (video or telephone). Laure Kidney, [place]  Date: 07/12/2023 10:26 AM  Virtual Visit via Video Note   I, Laure Kidney, connected with  Kimberly Armstrong  ([id number], 1980-09-30) on 07/12/23 at 10:15 AM EST by a video-enabled telemedicine application and verified that I am speaking with the correct person using two identifiers.  Location: Patient:  Virtual Visit Location Patient: Other: car in parking lot Provider: Virtual Visit Location Provider: Home Office   I discussed the limitations of evaluation and management by telemedicine and the availability of in person appointments. The patient expressed understanding and agreed to proceed.    History of Present Illness: Kimberly Armstrong is a 42 y.o. who identifies as a female who was assigned female at birth, and is being seen today for shortness of breath.  HPI: Shortness of Breath This is a new problem. The current episode started in the past 7 days. The problem occurs constantly. The problem has been gradually worsening. Associated symptoms include chest pain. Pertinent negatives include no abdominal pain, claudication, coryza, ear pain, fever, headaches, hemoptysis, leg pain, leg swelling, neck pain, orthopnea, PND, rash, rhinorrhea, sore throat, sputum production, swollen glands, syncope, vomiting or wheezing. The symptoms are aggravated by any activity. The patient has no known risk factors for DVT/PE. She has tried steroid inhalers for the symptoms. The treatment provided mild relief. Her past medical history is significant for asthma.    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   Raynaud's phenomenon without gangrene 10/05/2021   Seasonal  allergies 09/07/2021   Cold sore 09/07/2021   Body mass index (BMI) of 33.0-33.9 in adult 03/15/2021   Tobacco use disorder, continuous 12/03/2020   Moderate persistent asthma 26-Nov-1980    Allergies:  Allergies  Allergen Reactions   Sulfa Antibiotics Hives and  Rash   Medications:  Current Outpatient Medications:    acetaminophen (TYLENOL) 500 MG tablet, Take 1,000 mg by mouth every 6 (six) hours as needed for moderate pain., Disp: , Rfl:    albuterol (VENTOLIN HFA) 108 (90 Base) MCG/ACT inhaler, TAKE 2 PUFFS BY MOUTH EVERY 6 HOURS AS NEEDED FOR WHEEZE OR SHORTNESS OF BREATH, Disp: 18 g, Rfl: 2   Albuterol-Budesonide (AIRSUPRA) 90-80 MCG/ACT AERO, Inhale 2 puffs into the lungs every 6 (six) hours as needed. RESCUE INHALER, Disp: 5.9 g, Rfl: 11   Blood Pressure KIT, Use blood pressure monitor and cuff to check blood pressure at least once daily., Disp: 1 kit, Rfl: 0   conjugated estrogens (PREMARIN) vaginal cream, Place 1 Applicatorful vaginally 3 (three) times a week., Disp: 42.5 g, Rfl: 1   cyclobenzaprine (FLEXERIL) 10 MG tablet, TAKE 1/2 TO 1 TABLET BY MOUTH DAILY AT BEDTIME AS NEEDED FOR MUSCLE PAIN, Disp: 30 tablet, Rfl: 2   fluticasone (FLONASE) 50 MCG/ACT nasal spray, SPRAY 2 SPRAYS INTO EACH NOSTRIL EVERY DAY, Disp: 16 mL, Rfl: 3   fluticasone-salmeterol (ADVAIR) 100-50 MCG/ACT AEPB, Inhale 1 puff into the lungs 2 (two) times daily., Disp: 14 each, Rfl: 3   loratadine (CLARITIN) 10 MG tablet, Take 1 tablet (10 mg total) by mouth daily., Disp: 90 tablet, Rfl: 3   montelukast (SINGULAIR) 10 MG tablet, TAKE 1 TABLET BY MOUTH EVERYDAY AT BEDTIME, Disp: 90 tablet, Rfl: 3   mupirocin ointment (BACTROBAN) 2 %, Apply 1 Application topically daily as needed (wound care)., Disp: , Rfl:    senna-docusate (SENOKOT-S) 8.6-50 MG tablet, Take 1 tablet by mouth at bedtime as needed for mild constipation or moderate constipation., Disp: 30 tablet, Rfl: 5  Observations/Objective: Patient is well-developed, well-nourished in no acute distress.  Resting comfortably  at in her car. .  Head is normocephalic, atraumatic.  No labored breathing.  Speech is clear and coherent with logical content.  Patient is alert and oriented at baseline.    Assessment and  Plan: 1. Chest pain, unspecified type  2. Shortness of breath  Patient presenting with chest pain as well as shortness of breath with activity since this past Saturday.  States she feels significantly fatigued and has attempted to use her albuterol inhaler.  Without any relief.  Given these concerns and the patient's history I recommend an in person evaluation.  I recommended that the patient follow-up at the nearest urgent care or emergency department.  She verbalized understanding and is planning to report to urgent care for evaluation  Follow Up Instructions: I discussed the assessment and treatment plan with the patient. The patient was provided an opportunity to ask questions and all were answered. The patient agreed with the plan and demonstrated an understanding of the instructions.  A copy of instructions were sent to the patient via MyChart unless otherwise noted below.    The patient was advised to call back or seek an in-person evaluation if the symptoms worsen or if the condition fails to improve as anticipated.    Laure Kidney, PA-C

## 2023-07-12 NOTE — ED Provider Notes (Addendum)
[facility]    [name]: [id number] Arrival date & time: 07/12/23  1208      History   Chief Complaint Chief Complaint  Patient presents with   Follow-up    Dry cough,  congested. Did a virtual appointment and was told to get chest xray at urgent care - Entered by patient   Cough    HPI Kimberly Armstrong is a 42 y.o. female.    Cough Associated symptoms: shortness of breath   Associated symptoms: no chest pain, no diaphoresis and no wheezing   Has had a nonproductive cough for several days, admits some tightness in her chest without wheezing.  Has some sharp pain in her back on both sides when she takes a deep breath or sneezes. Had a virtual visit today was told to be seen in person, states provider suggested she may need a chest x-ray and some lab work. She is a smoker.  Denies recent surgical procedures or immobilization.  Admits estrogen use.  Admits soreness in both calfs which she attributes to increased activity.  Denies history of DVT. 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   Raynaud's phenomenon without gangrene 10/05/2021   Seasonal allergies 09/07/2021   Cold sore 09/07/2021   Body mass index (BMI) of 33.0-33.9 in adult 03/15/2021   Tobacco use disorder, continuous 12/03/2020   Moderate persistent asthma 1979/08/15    Past Surgical History:  Procedure Laterality Date   BACK SURGERY     carpel tunnel     CESAREAN SECTION     x3   ENDOMETRIAL ABLATION      EYE SURGERY N/A    [employer] 11/30/2020   OTHER SURGICAL HISTORY  2013   removal of scar tissue (endometriosis) from Pfannenstiel incision   PARTIAL HYSTERECTOMY  2017   cervix, ovaries left in situ   ROBOT ASSISTED TRACHELECTOMY     ROBOT ASSISTED TRACHELECTOMY  03/09/2023   ROBOTIC ASSISTED LAPAROSCOPIC OVARIAN CYSTECTOMY Left 03/09/2023   SPINE SURGERY N/A    [employer] 11/30/2020   TUBAL LIGATION N/A    [employer] 11/30/2020, at time of last section    OB History     Gravida  4   Para  3   Term  3   Preterm  0   AB  1   Living  3      SAB  1   IAB  0   Ectopic  0   Multiple  0   Live Births  3            Home Medications    Prior to Admission medications   Medication Sig Start Date End Date Taking? Authorizing Provider  acetaminophen (TYLENOL) 500 MG tablet Take 1,000 mg by mouth every 6 (six) hours as needed for moderate pain.    [provider]  Albuterol-Budesonide (AIRSUPRA) 90-80 MCG/ACT AERO Inhale 2 puffs into the lungs every 6 (six) hours as needed. RESCUE INHALER  05/10/23   Saralyn Pilar A, PA  Blood Pressure KIT Use blood pressure monitor and cuff to check blood pressure at least once daily. 05/10/23   Melida Quitter, PA  conjugated estrogens (PREMARIN) vaginal cream Place 1 Applicatorful vaginally 3 (three) times a week. 04/08/23   Carver Fila, MD  cyclobenzaprine (FLEXERIL) 10 MG tablet TAKE 1/2 TO 1 TABLET BY MOUTH DAILY AT BEDTIME AS NEEDED FOR MUSCLE PAIN 02/16/23   Carlean Jews, NP  fluticasone (FLONASE) 50 MCG/ACT nasal spray SPRAY 2 SPRAYS INTO EACH NOSTRIL EVERY DAY 05/10/23   Saralyn Pilar A, PA  fluticasone-salmeterol (ADVAIR) 100-50 MCG/ACT AEPB Inhale 1 puff into the lungs 2 (two) times daily. 05/10/23   Melida Quitter, PA  loratadine (CLARITIN) 10 MG tablet Take 1 tablet (10 mg total) by mouth daily. 05/10/23   Saralyn Pilar A, PA  montelukast (SINGULAIR) 10 MG tablet TAKE 1 TABLET BY MOUTH EVERYDAY AT BEDTIME  12/02/22   Carlean Jews, NP  mupirocin ointment (BACTROBAN) 2 % Apply 1 Application topically daily as needed (wound care). 02/15/23   [provider]  senna-docusate (SENOKOT-S) 8.6-50 MG tablet Take 1 tablet by mouth at bedtime as needed for mild constipation or moderate constipation. 11/29/22   Carlean Jews, NP  VENTOLIN HFA 108 (90 Base) MCG/ACT inhaler INHALE 2 PUFFS EVERY 6 HOURS AS NEEDED FOR WHEEZE OR SHORTNESS OF BREATH 07/12/23   Melida Quitter, PA    Family History Family History  Problem Relation Age of Onset   Breast cancer Mother    Heart attack Mother    Hypertension Mother    Diabetes Sister    Breast cancer Maternal Grandmother    Dementia Paternal Grandmother    Colon cancer Neg Hx    Esophageal cancer Neg Hx    Stomach cancer Neg Hx    Colon polyps Neg Hx    Pancreatic cancer Neg Hx     Social History Social History   Tobacco Use   Smoking status: Every Day    Current packs/day: 0.50    Average packs/day: 0.5 packs/day for 25.2 years (12.6 ttl pk-yrs)    Types: Cigarettes    Start date: 12/03/2000    Passive exposure: Current   Smokeless tobacco: Never  Vaping Use   Vaping status: Never Used  Substance Use Topics   Alcohol use: Yes    Alcohol/week: 2.0 standard drinks of alcohol    Types: 2 Glasses of wine per week    Comment: occasionally   Drug use: Never     Allergies   Sulfa antibiotics   Review of Systems Review of Systems  Constitutional:  Negative for appetite change, diaphoresis and fatigue.  Respiratory:  Positive for cough, chest tightness and shortness of breath. Negative for wheezing.   Cardiovascular:  Negative for chest pain, palpitations and leg swelling.  Gastrointestinal:  Negative for abdominal pain, nausea and vomiting.  Neurological:  Positive for light-headedness.     Physical Exam Triage Vital Signs ED Triage Vitals  Encounter Vitals Group     BP 07/12/23 1240 (!) 147/97     Systolic BP Percentile  --      Diastolic BP Percentile --      Pulse Rate 07/12/23 1240 82     Resp 07/12/23 1240 20     Temp 07/12/23 1240 98.3 F (36.8 C)     Temp Source 07/12/23 1240 Oral     SpO2 07/12/23 1240 96 %  Weight --      Height --      Head Circumference --      Peak Flow --      Pain Score 07/12/23 1237 6     Pain Loc --      Pain Education --      Exclude from Growth Chart --    No data found.  Updated Vital Signs BP (!) 147/97 (BP Location: Right Arm) Comment (BP Location): large cuff  Pulse 82   Temp 98.3 F (36.8 C) (Oral)   Resp 20   SpO2 96%   Visual Acuity Right Eye Distance:   Left Eye Distance:   Bilateral Distance:    Right Eye Near:   Left Eye Near:    Bilateral Near:     Physical Exam Vitals and nursing note reviewed.  Constitutional:      Appearance: Normal appearance. She is not ill-appearing.  HENT:     Head: Normocephalic and atraumatic.     Right Ear: Tympanic membrane normal.     Left Ear: Tympanic membrane normal.  Eyes:     Conjunctiva/sclera: Conjunctivae normal.  Cardiovascular:     Rate and Rhythm: Normal rate and regular rhythm.     Heart sounds: Normal heart sounds.  Pulmonary:     Effort: No respiratory distress.     Breath sounds: Normal breath sounds. No wheezing, rhonchi or rales.  Musculoskeletal:     Cervical back: Neck supple.  Neurological:     Mental Status: She is alert and oriented to person, place, and time.  Psychiatric:        Mood and Affect: Mood normal.      UC Treatments / Results  Labs (all labs ordered are listed, but only abnormal results are displayed) Labs Reviewed - No data to display  EKG   Radiology No results found.  Procedures Procedures (including critical care time)  Medications Ordered in UC Medications - No data to display  Initial Impression / Assessment and Plan / UC Course  I have reviewed the triage vital signs and the nursing notes.  Pertinent labs & imaging results that were  available during my care of the patient were reviewed by me and considered in my medical decision making (see chart for details).     42 year old female history of asthma, smoker, estrogen use presents with cough, chest tightness and pleuritic pain.  Recommend urgent ED evaluation for imaging and possible lab work.  Final Clinical Impressions(s) / UC Diagnoses   Final diagnoses:  Pleuritic pain  Cough, unspecified type   Discharge Instructions   None    ED Prescriptions   None    PDMP not reviewed this encounter.   Meliton Rattan, PA 07/12/23 1256    Meliton Rattan, [place] 07/12/23 1258

## 2023-07-12 NOTE — ED Triage Notes (Addendum)
C/o cough & congestion. C/o fatigue & shortness of breath. Shallow breaths in triage. Hx of asthma. Went to urgent care. Here for chest xray and eval.  Took 2 puffs of inhaler in triage.  States Saturday had a sharp pain with deep breath.

## 2023-07-12 NOTE — Patient Instructions (Signed)
Owens Loffler, thank you for joining Laure Kidney, PA-C for today's virtual visit.  While this provider is not your primary care provider (PCP), if your PCP is located in our provider database this encounter information will be shared with them immediately following your visit.   A [facility] MyChart account gives you access to today's visit and all your visits, tests, and labs performed at [facility] " click here if you don't have a [facility] MyChart account or go to [email or web address]  Consent: (Patient) Owens Loffler provided verbal consent for this virtual visit at the beginning of the encounter.  Current Medications:  Current Outpatient Medications:    acetaminophen (TYLENOL) 500 MG tablet, Take 1,000 mg by mouth every 6 (six) hours as needed for moderate pain., Disp: , Rfl:    albuterol (VENTOLIN HFA) 108 (90 Base) MCG/ACT inhaler, TAKE 2 PUFFS BY MOUTH EVERY 6 HOURS AS NEEDED FOR WHEEZE OR SHORTNESS OF BREATH, Disp: 18 g, Rfl: 2   Albuterol-Budesonide (AIRSUPRA) 90-80 MCG/ACT AERO, Inhale 2 puffs into the lungs every 6 (six) hours as needed. RESCUE INHALER, Disp: 5.9 g, Rfl: 11   Blood Pressure KIT, Use blood pressure monitor and cuff to check blood pressure at least once daily., Disp: 1 kit, Rfl: 0   conjugated estrogens (PREMARIN) vaginal cream, Place 1 Applicatorful vaginally 3 (three) times a week., Disp: 42.5 g, Rfl: 1   cyclobenzaprine (FLEXERIL) 10 MG tablet, TAKE 1/2 TO 1 TABLET BY MOUTH DAILY AT BEDTIME AS NEEDED FOR MUSCLE PAIN, Disp: 30 tablet, Rfl: 2   fluticasone (FLONASE) 50 MCG/ACT nasal spray, SPRAY 2 SPRAYS INTO EACH NOSTRIL EVERY DAY, Disp: 16 mL, Rfl: 3   fluticasone-salmeterol (ADVAIR) 100-50 MCG/ACT AEPB, Inhale 1 puff into the lungs 2 (two) times daily., Disp: 14 each, Rfl: 3   loratadine (CLARITIN) 10 MG tablet, Take 1 tablet (10 mg total) by mouth daily., Disp: 90 tablet, Rfl: 3   montelukast (SINGULAIR) 10 MG tablet,  TAKE 1 TABLET BY MOUTH EVERYDAY AT BEDTIME, Disp: 90 tablet, Rfl: 3   mupirocin ointment (BACTROBAN) 2 %, Apply 1 Application topically daily as needed (wound care)., Disp: , Rfl:    senna-docusate (SENOKOT-S) 8.6-50 MG tablet, Take 1 tablet by mouth at bedtime as needed for mild constipation or moderate constipation., Disp: 30 tablet, Rfl: 5   Medications ordered in this encounter:  No orders of the defined types were placed in this encounter.    *If you need refills on other medications prior to your next appointment, please contact your pharmacy*  Follow-Up: Call back or seek an in-person evaluation if the symptoms worsen or if the condition fails to improve as anticipated.  [facility] [phone]  Other Instructions Report to the nearest ER or urgent care for evaluation.   If you have been instructed to have an in-person evaluation today at a local Urgent Care facility, please use the link below. It will take you to a list of all of our available [facility], including address, phone number and hours of operation. Please do not delay care.  [facility]  If you or a family member do not have a primary care provider, use the link below to schedule a visit and establish care. When you choose a [facility] primary care physician or advanced practice provider, you gain a long-term partner in health. Find a Primary Care Provider  Learn more about [facility]'s in-office and virtual care options:  -  Get Care Now

## 2023-07-12 NOTE — ED Notes (Signed)
Describes chest as constricted airways, but not so much related to any phlegm

## 2023-07-12 NOTE — ED Triage Notes (Addendum)
Patient has complaints of cough  and congestion.  Reports doing a televisit and was told to get a chest xray at an urgent care.    Symptoms since Saturday night.  Overall patient is feeling very tired and some sob.  Patient has a history of asthma.  Patient thought symptoms might be related to asthma  Patient has used albuterol inhaler

## 2023-07-12 NOTE — Discharge Instructions (Addendum)
As we discussed your labs and your x-rays were normal.  Your D-dimer is normal and do not think you have a blood clot right now.  Please use albuterol every 4 hours as needed.  Take prednisone as prescribed  See your doctor for follow-up  Return to ER if you have worse shortness of breath or chest pain or cough or fever

## 2023-07-13 ENCOUNTER — Ambulatory Visit: Payer: Medicaid Other | Attending: Gynecologic Oncology | Admitting: Physical Therapy

## 2023-07-20 ENCOUNTER — Encounter: Payer: Medicaid Other | Admitting: Physical Therapy

## 2023-08-03 ENCOUNTER — Ambulatory Visit: Payer: Medicaid Other | Admitting: Physical Therapy

## 2023-08-16 ENCOUNTER — Telehealth: Payer: Medicaid Other | Admitting: Physician Assistant

## 2023-08-16 ENCOUNTER — Telehealth: Payer: Medicaid Other

## 2023-08-16 DIAGNOSIS — J208 Acute bronchitis due to other specified organisms: Secondary | ICD-10-CM

## 2023-08-16 DIAGNOSIS — N3281 Overactive bladder: Secondary | ICD-10-CM

## 2023-08-16 DIAGNOSIS — B9689 Other specified bacterial agents as the cause of diseases classified elsewhere: Secondary | ICD-10-CM | POA: Diagnosis not present

## 2023-08-16 MED ORDER — PROMETHAZINE-DM 6.25-15 MG/5ML PO SYRP
5.0000 mL | ORAL_SOLUTION | Freq: Four times a day (QID) | ORAL | 0 refills | Status: DC | PRN
Start: 2023-08-16 — End: 2023-09-15

## 2023-08-16 MED ORDER — AZITHROMYCIN 250 MG PO TABS: ORAL_TABLET | ORAL | 0 refills | Status: AC

## 2023-08-16 MED ORDER — OXYBUTYNIN CHLORIDE ER 10 MG PO TB24: 10.0000 mg | ORAL_TABLET | Freq: Every day | ORAL | 0 refills | Status: DC

## 2023-08-16 NOTE — Patient Instructions (Signed)
Owens Loffler, thank you for joining [name], PA-C for today's virtual visit.  While this provider is not your primary care provider (PCP), if your PCP is located in our provider database this encounter information will be shared with them immediately following your visit.   A [facility] MyChart account gives you access to today's visit and all your visits, tests, and labs performed at [facility] " click here if you don't have a [facility] MyChart account or go to [email or web address]  Consent: (Patient) Owens Loffler provided verbal consent for this virtual visit at the beginning of the encounter.  Current Medications:  Current Outpatient Medications:    azithromycin (ZITHROMAX) 250 MG tablet, Take 2 tablets on day 1, then 1 tablet daily on days 2 through 5, Disp: 6 tablet, Rfl: 0   oxybutynin (DITROPAN-XL) 10 MG 24 hr tablet, Take 1 tablet (10 mg total) by mouth at bedtime., Disp: 30 tablet, Rfl: 0   promethazine-dextromethorphan (PROMETHAZINE-DM) 6.25-15 MG/5ML syrup, Take 5 mLs by mouth 4 (four) times daily as needed., Disp: 118 mL, Rfl: 0   acetaminophen (TYLENOL) 500 MG tablet, Take 1,000 mg by mouth every 6 (six) hours as needed for moderate pain., Disp: , Rfl:    Albuterol-Budesonide (AIRSUPRA) 90-80 MCG/ACT AERO, Inhale 2 puffs into the lungs every 6 (six) hours as needed. RESCUE INHALER, Disp: 5.9 g, Rfl: 11   Blood Pressure KIT, Use blood pressure monitor and cuff to check blood pressure at least once daily., Disp: 1 kit, Rfl: 0   conjugated estrogens (PREMARIN) vaginal cream, Place 1 Applicatorful vaginally 3 (three) times a week., Disp: 42.5 g, Rfl: 1   cyclobenzaprine (FLEXERIL) 10 MG tablet, TAKE 1/2 TO 1 TABLET BY MOUTH DAILY AT BEDTIME AS NEEDED FOR MUSCLE PAIN, Disp: 30 tablet, Rfl: 2   fluticasone (FLONASE) 50 MCG/ACT nasal spray, SPRAY 2 SPRAYS INTO EACH NOSTRIL EVERY DAY, Disp: 16 mL, Rfl: 3   fluticasone-salmeterol  (ADVAIR) 100-50 MCG/ACT AEPB, Inhale 1 puff into the lungs 2 (two) times daily., Disp: 14 each, Rfl: 3   loratadine (CLARITIN) 10 MG tablet, Take 1 tablet (10 mg total) by mouth daily., Disp: 90 tablet, Rfl: 3   montelukast (SINGULAIR) 10 MG tablet, TAKE 1 TABLET BY MOUTH EVERYDAY AT BEDTIME, Disp: 90 tablet, Rfl: 3   mupirocin ointment (BACTROBAN) 2 %, Apply 1 Application topically daily as needed (wound care)., Disp: , Rfl:    senna-docusate (SENOKOT-S) 8.6-50 MG tablet, Take 1 tablet by mouth at bedtime as needed for mild constipation or moderate constipation., Disp: 30 tablet, Rfl: 5   VENTOLIN HFA 108 (90 Base) MCG/ACT inhaler, INHALE 2 PUFFS EVERY 6 HOURS AS NEEDED FOR WHEEZE OR SHORTNESS OF BREATH, Disp: 18 g, Rfl: 1   Medications ordered in this encounter:  Meds ordered this encounter  Medications   azithromycin (ZITHROMAX) 250 MG tablet    Sig: Take 2 tablets on day 1, then 1 tablet daily on days 2 through 5    Dispense:  6 tablet    Refill:  0    Supervising Provider:   Merrilee Jansky [[id number]]   promethazine-dextromethorphan (PROMETHAZINE-DM) 6.25-15 MG/5ML syrup    Sig: Take 5 mLs by mouth 4 (four) times daily as needed.    Dispense:  118 mL    Refill:  0    Supervising Provider:   Merrilee Jansky [[id number]]   oxybutynin (DITROPAN-XL) 10 MG 24 hr tablet    Sig: Take 1 tablet (10 mg total)  by mouth at bedtime.    Dispense:  30 tablet    Refill:  0    Supervising Provider:   Merrilee Jansky [[id number]]     *If you need refills on other medications prior to your next appointment, please contact your pharmacy*  Follow-Up: Call back or seek an in-person evaluation if the symptoms worsen or if the condition fails to improve as anticipated.  [facility] [phone]  Other Instructions  Upper Respiratory Infection, Adult An upper respiratory infection (URI) is a common viral infection of the nose, throat, and upper air passages that lead to the lungs. The  most common type of URI is the common cold. URIs usually get better on their own, without medical treatment. What are the causes? A URI is caused by a virus. You may catch a virus by: Breathing in droplets from an infected person's cough or sneeze. Touching something that has been exposed to the virus (is contaminated) and then touching your mouth, nose, or eyes. What increases the risk? You are more likely to get a URI if: You are very young or very old. You have close contact with others, such as at work, school, or a health care facility. You smoke. You have long-term (chronic) heart or lung disease. You have a weakened disease-fighting system (immune system). You have nasal allergies or asthma. You are experiencing a lot of stress. You have poor nutrition. What are the signs or symptoms? A URI usually involves some of the following symptoms: Runny or stuffy (congested) nose. Cough. Sneezing. Sore throat. Headache. Fatigue. Fever. Loss of appetite. Pain in your forehead, behind your eyes, and over your cheekbones (sinus pain). Muscle aches. Redness or irritation of the eyes. Pressure in the ears or face. How is this diagnosed? This condition may be diagnosed based on your medical history and symptoms, and a physical exam. Your health care provider may use a swab to take a mucus sample from your nose (nasal swab). This sample can be tested to determine what virus is causing the illness. How is this treated? URIs usually get better on their own within 7-10 days. Medicines cannot cure URIs, but your health care provider may recommend certain medicines to help relieve symptoms, such as: Over-the-counter cold medicines. Cough suppressants. Coughing is a type of defense against infection that helps to clear the respiratory system, so take these medicines only as recommended by your health care provider. Fever-reducing medicines. Follow these instructions at home: Activity Rest as  needed. If you have a fever, stay home from work or school until your fever is gone or until your health care provider says your URI cannot spread to other people (is no longer contagious). Your health care provider may have you wear a face mask to prevent your infection from spreading. Relieving symptoms Gargle with a mixture of salt and water 3-4 times a day or as needed. To make salt water, completely dissolve -1 tsp (3-6 g) of salt in 1 cup (237 mL) of warm water. Use a cool-mist humidifier to add moisture to the air. This can help you breathe more easily. Eating and drinking  Drink enough fluid to keep your urine pale yellow. Eat soups and other clear broths. General instructions  Take over-the-counter and prescription medicines only as told by your health care provider. These include cold medicines, fever reducers, and cough suppressants. Do not use any products that contain nicotine or tobacco. These products include cigarettes, chewing tobacco, and vaping devices, such  as e-cigarettes. If you need help quitting, ask your health care provider. Stay away from secondhand smoke. Stay up to date on all immunizations, including the yearly (annual) flu vaccine. Keep all follow-up visits. This is important. How to prevent the spread of infection to others URIs can be contagious. To prevent the infection from spreading: Wash your hands with soap and water for at least 20 seconds. If soap and water are not available, use hand sanitizer. Avoid touching your mouth, face, eyes, or nose. Cough or sneeze into a tissue or your sleeve or elbow instead of into your hand or into the air.  Contact a health care provider if: You are getting worse instead of better. You have a fever or chills. Your mucus is brown or red. You have yellow or brown discharge coming from your nose. You have pain in your face, especially when you bend forward. You have swollen neck glands. You have pain while  swallowing. You have white areas in the back of your throat. Get help right away if: You have shortness of breath that gets worse. You have severe or persistent: Headache. Ear pain. Sinus pain. Chest pain. You have chronic lung disease along with any of the following: Making high-pitched whistling sounds when you breathe, most often when you breathe out (wheezing). Prolonged cough (more than 14 days). Coughing up blood. A change in your usual mucus. You have a stiff neck. You have changes in your: Vision. Hearing. Thinking. Mood. These symptoms may be an emergency. Get help right away. Call 911. Do not wait to see if the symptoms will go away. Do not drive yourself to the hospital. Summary An upper respiratory infection (URI) is a common infection of the nose, throat, and upper air passages that lead to the lungs. A URI is caused by a virus. URIs usually get better on their own within 7-10 days. Medicines cannot cure URIs, but your health care provider may recommend certain medicines to help relieve symptoms. This information is not intended to replace advice given to you by your health care provider. Make sure you discuss any questions you have with your health care provider. Document Revised: 03/18/2021 Document Reviewed: 03/18/2021 Elsevier Patient Education  2024 Elsevier Inc.    If you have been instructed to have an in-person evaluation today at a local Urgent Care facility, please use the link below. It will take you to a list of all of our available [facility], including address, phone number and hours of operation. Please do not delay care.  [facility]  If you or a family member do not have a primary care provider, use the link below to schedule a visit and establish care. When you choose a [facility] primary care physician or advanced practice provider, you gain a long-term partner in health. Find a Primary Care Provider  Learn more  about [facility]'s in-office and virtual care options: [facility] - Get Care Now

## 2023-08-16 NOTE — Progress Notes (Signed)
Virtual Visit Consent   Kimberly Armstrong, you are scheduled for a virtual visit with a [facility] provider today. Just as with appointments in the office, your consent must be obtained to participate. Your consent will be active for this visit and any virtual visit you may have with one of our providers in the next 365 days. If you have a MyChart account, a copy of this consent can be sent to you electronically.  As this is a virtual visit, video technology does not allow for your provider to perform a traditional examination. This may limit your provider's ability to fully assess your condition. If your provider identifies any concerns that need to be evaluated in person or the need to arrange testing (such as labs, EKG, etc.), we will make arrangements to do so. Although advances in technology are sophisticated, we cannot ensure that it will always work on either your end or our end. If the connection with a video visit is poor, the visit may have to be switched to a telephone visit. With either a video or telephone visit, we are not always able to ensure that we have a secure connection.  By engaging in this virtual visit, you consent to the provision of healthcare and authorize for your insurance to be billed (if applicable) for the services provided during this visit. Depending on your insurance coverage, you may receive a charge related to this service.  I need to obtain your verbal consent now. Are you willing to proceed with your visit today? Kimberly Armstrong has provided verbal consent on 08/16/2023 for a virtual visit (video or telephone). Kimberly Loveless, PA-C  Date: 08/16/2023 2:00 PM  Virtual Visit via Video Note   I, Kimberly Armstrong, connected with  Kimberly Armstrong  ([id number], Jun 08, 1981) on 08/16/23 at  2:00 PM EST by a video-enabled telemedicine application and verified that I am speaking with the correct person using two  identifiers.  Location: Patient: Virtual Visit Location Patient: Home Provider: Virtual Visit Location Provider: Home Office   I discussed the limitations of evaluation and management by telemedicine and the availability of in person appointments. The patient expressed understanding and agreed to proceed.    History of Present Illness: Kimberly Armstrong is a 42 y.o. who identifies as a female who was assigned female at birth, and is being seen today for URI symptoms and bladder spasms.  HPI: Urinary Tract Infection  This is a new problem. The problem occurs every urination. The problem has been gradually worsening. The quality of the pain is described as aching and burning. Associated symptoms include frequency, hesitancy and urgency. Pertinent negatives include no chills, discharge, flank pain, nausea or vomiting. 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 She has tried nothing for the symptoms.  URI  This is a new problem. The current episode started in the past 7 days (Wednesday, 08/10/23). The problem has been gradually worsening. There has been no fever. Associated symptoms include congestion, coughing, headaches, sinus pain and wheezing. Pertinent negatives include no dysuria, ear pain, nausea, plugged ear sensation, rhinorrhea, sore throat or vomiting. Associated symptoms comments: Post nasal drainage.     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   Raynaud's phenomenon without gangrene 10/05/2021   Seasonal allergies 09/07/2021   Cold sore 09/07/2021   Body mass index (BMI) of 33.0-33.9 in adult 03/15/2021   Tobacco use disorder, continuous 12/03/2020   Moderate persistent asthma Jan 03, 1979    Allergies:  Allergies  Allergen Reactions   Sulfa Antibiotics Hives and Rash   Medications:  Current Outpatient Medications:    azithromycin (ZITHROMAX) 250 MG tablet, Take 2 tablets on day 1, then 1 tablet daily on days 2 through 5, Disp: 6 tablet, Rfl: 0   oxybutynin (DITROPAN-XL) 10 MG 24 hr tablet, Take 1 tablet (10 mg total) by mouth at bedtime., Disp: 30 tablet, Rfl: 0   promethazine-dextromethorphan (PROMETHAZINE-DM) 6.25-15 MG/5ML syrup, Take 5 mLs by mouth 4 (four) times daily as needed., Disp: 118 mL, Rfl: 0   acetaminophen (TYLENOL) 500 MG tablet, Take 1,000 mg by mouth every 6 (six) hours as needed for moderate pain., Disp: , Rfl:    Albuterol-Budesonide (AIRSUPRA) 90-80 MCG/ACT AERO, Inhale 2 puffs into the lungs every 6 (six) hours as needed. RESCUE INHALER, Disp: 5.9 g, Rfl: 11   Blood Pressure KIT, Use blood pressure monitor and cuff to check blood pressure at least once daily., Disp: 1 kit, Rfl: 0   conjugated estrogens (PREMARIN) vaginal cream, Place 1 Applicatorful vaginally 3 (three) times a week., Disp: 42.5 g, Rfl: 1   cyclobenzaprine (FLEXERIL) 10 MG tablet, TAKE 1/2 TO 1 TABLET BY MOUTH DAILY AT BEDTIME AS NEEDED FOR MUSCLE PAIN, Disp: 30 tablet, Rfl: 2   fluticasone (FLONASE) 50 MCG/ACT nasal spray, SPRAY 2 SPRAYS INTO EACH NOSTRIL EVERY DAY, Disp: 16 mL, Rfl: 3   fluticasone-salmeterol (ADVAIR) 100-50 MCG/ACT AEPB, Inhale 1 puff into the lungs 2 (two) times daily., Disp: 14 each, Rfl: 3   loratadine (CLARITIN) 10 MG tablet, Take 1 tablet (10 mg total) by mouth daily., Disp: 90 tablet, Rfl: 3   montelukast (SINGULAIR) 10 MG tablet, TAKE 1 TABLET BY MOUTH EVERYDAY AT BEDTIME,  Disp: 90 tablet, Rfl: 3   mupirocin ointment (BACTROBAN) 2 %, Apply 1 Application topically daily as needed (wound care)., Disp: , Rfl:    senna-docusate (SENOKOT-S) 8.6-50 MG tablet, Take 1 tablet by mouth at bedtime as needed for mild constipation or moderate constipation., Disp: 30 tablet, Rfl: 5   VENTOLIN HFA 108 (90 Base) MCG/ACT inhaler, INHALE 2 PUFFS EVERY 6 HOURS AS NEEDED FOR WHEEZE OR SHORTNESS OF BREATH, Disp: 18 g, Rfl: 1  Observations/Objective: Patient is well-developed, well-nourished in no acute distress.  Resting comfortably at home.  Head is normocephalic, atraumatic.  No labored breathing.  Speech is clear and coherent with logical content.  Patient is alert and oriented at baseline.    Assessment and Plan: 1. Acute bacterial bronchitis (Primary) - azithromycin (ZITHROMAX) 250 MG tablet; Take 2 tablets on day 1, then 1 tablet daily on days 2 through 5  Dispense: 6 tablet; Refill: 0 - promethazine-dextromethorphan (PROMETHAZINE-DM) 6.25-15 MG/5ML syrup; Take 5 mLs by mouth 4 (four) times daily as needed.  Dispense: 118 mL; Refill: 0  2. OAB (overactive bladder) - oxybutynin (DITROPAN-XL) 10 MG 24 hr tablet; Take 1 tablet (10 mg total) by mouth at bedtime.  Dispense: 30 tablet; Refill: 0  -  Worsening URI symptoms despite OTC medications - Will treat with Z-pack and Promethazine DM cough syrup - Can continue Mucinex  - Push fluids.  - Rest.  - Steam and humidifier can help  - Will add Oxybutynin at bedtime for OAB and bladder spasms  - Seek in person evaluation if worsening or symptoms fail to improve    Follow Up Instructions: I discussed the assessment and treatment plan with the patient. The patient was provided an opportunity to ask questions and all were answered. The patient agreed with the plan and demonstrated an understanding of the instructions.  A copy of instructions were sent to the patient via MyChart unless otherwise noted below.    The patient  was advised to call back or seek an in-person evaluation if the symptoms worsen or if the condition fails to improve as anticipated.    Kimberly Loveless, PA-C

## 2023-08-18 ENCOUNTER — Encounter: Payer: Medicaid Other | Admitting: Physical Therapy

## 2023-08-23 ENCOUNTER — Ambulatory Visit: Payer: Medicaid Other | Admitting: Physical Therapy

## 2023-08-26 ENCOUNTER — Encounter: Payer: Self-pay | Admitting: Gynecologic Oncology

## 2023-08-26 ENCOUNTER — Inpatient Hospital Stay: Payer: Medicaid Other | Attending: Gynecologic Oncology | Admitting: Gynecologic Oncology

## 2023-08-26 DIAGNOSIS — N3941 Urge incontinence: Secondary | ICD-10-CM

## 2023-08-26 DIAGNOSIS — R351 Nocturia: Secondary | ICD-10-CM | POA: Diagnosis not present

## 2023-08-26 DIAGNOSIS — R350 Frequency of micturition: Secondary | ICD-10-CM

## 2023-08-26 DIAGNOSIS — R3915 Urgency of urination: Secondary | ICD-10-CM

## 2023-08-26 NOTE — Progress Notes (Signed)
Gynecologic Oncology Telehealth Note: Gyn-Onc  I connected with Kimberly Armstrong on 08/26/23 at  4:30 PM EST by telephone and verified that I am speaking with the correct person using two identifiers.  I discussed the limitations, risks, security and privacy concerns of performing an evaluation and management service by telemedicine and the availability of in-person appointments. I also discussed with the patient that there may be a patient responsible charge related to this service. The patient expressed understanding and agreed to proceed.  Other persons participating in the visit and their role in the encounter: none.  Patient's location: home Provider's location: [facility]  Reason for Visit: follow-up  Treatment History: The patient had her last delivery by C-section in 2017.  She had a tubal ligation at that time.  Shortly after she delivered, she had an IUD placed that was expelled after several heavy menses.  She denies having any pain prior to this last C-section just irregular and heavy bleeding.  Later in 2017, she underwent partial hysterectomy.  She remembers a discussion about leaving the ovaries in situ but does not remember discussion about leaving the cervix.  The physician who performed her hysterectomy in [place] has since passed away.  [facility] [place].   Over the last 6 years, she began having pain similar to menstrual cramping.  Initially, this happened once a month with some mild cramping.  Some months she would not have any pain.  She describes this as cramping in her deep pelvis.  Within the last year and a half, she has noticed increased pain as well as increased frequency of the pain.  She now has cramping for almost 2 weeks of every month.  She has been treated for urinary tract infections, has tried changing her diet, without any effect on her pain.  She was recently started on Orilissa at the higher dose.  This helped  immediately but she developed significant hot flashes, mood swings, and other symptoms.  She ultimately decided to stop the [place].  She had recurrence of her pain within a couple of days after stopping the medication with some worsening of the pain.  She restarted the Orilissa at half the dose, which helped some but she had persistent symptoms.  She ultimately decided to take a more naturalistic approach.  She stopped the [place] 3 weeks ago.  She is taking a multivitamin for women, pre and probiotic, and a vitamin B complex.  These interventions have helped significantly.  She denies any severe pain.  If she develops dull pain, she uses Tylenol, ibuprofen, and/or a muscle relaxant.   In early 2023, she had multiple imaging studies to workup her symptoms. 09/10/21: Pelvic ultrasound reveals surgically absent uterus.  In the posterior aspect of the vaginal cuff is a poorly defined hypoechoic nodule measuring 2.2 x 1.5 x 1.7 cm that is incompletely characterized on transabdominal exam.  There is a slightly more hyperechoic nodule in the superior aspect of the vaginal cuff measuring 1.5 x 1.1 x 1.4 cm that causes mass mild effect on the bladder base.  Right ovary measures up to 4.7 cm without dominant cyst lesion.  Left ovary measures up to 6.1 cm again without dominant cystic lesion.  Both ovaries noted to be prominent in size although no definitive cystic or solid lesions seen. 09/17/2021: MRI of the pelvis shows 1.9 x 1.7 x 1.8 cm complex cystic mass in the central cervical stroma.  Cervical canal is  not well-demonstrated but this lesion may be related to the canal or in the cervical stroma.  Could represent tiny cluster of multiple cyst or single multicystic lesion.  Large nabothian cyst would also be a consideration.  Cystic cervicitis is in the differential and adenoma malignancy cannot be excluded.  Second smaller nodular component along the anterior wall of the cervix is immediately anterior to the above  lesion and measures up to 1.3 cm.  It generates some mass effect on the posterior wall of the urinary bladder, does not appear overtly aggressive and may simply represent scar.  Unremarkable appearance of the ovaries.   10/15/21: Pap and ECC. Pap was NIML. ECC showed scant benign glandular and squamous tissue.   Received records from [facility] in [place].  Patient was admitted in December 2017 for surgery in the setting of abnormal uterine bleeding and severe dysmenorrhea.  She underwent supracervical hysterectomy, bilateral salpingectomy, and excision of abdominal wall mass via Pfannenstiel incision.  Findings at the time of surgery included some bladder adhesions in the setting of her prior C-sections.  Posterior cul-de-sac was described as "clear".  Normal-appearing bilateral tubes and ovaries.  There is not mention of significant pelvic adhesive disease.  There is not mention of any endometriosis within the pelvis.  It is unclear to me by the operative report why a supracervical hysterectomy was performed.   The report shows proliferative endometrium, no hyperplasia or atypia.  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  Bilateral fallopian tubes without significant pathology.  Abdominal wall mass that had been excised showed benign fibrous tissue and skeletal muscle with fibrous and reactive changes.  No endometriosis, atypia or malignancy.   02/04/23: Pelvic ultrasound Surgically absent. A septated hypoechoic region is identified in the region of the vaginal cuff measuring 1.4 x 1.2 x 0.9 cm and another adjacent hypoechoic region measures 1.2 x 1.1 x 0.8 cm. No internal blood flow.   03/09/23: Robotic-assisted trachelectomy, lysis of adhesions, unavoidable cystotomy and primary repair, cystoscopy, left ovarian cystectomies x2 Cystoscopy with ureteral ICG injection (Dr. Liliane Shi) Findings: On EUA, normal cervix.  On intra-abdominal entry, normal upper abdominal survey.  Normal small and  large bowel.  Omentum with some filmy adhesions to the left pelvic sidewall and left aspect of the bladder.  Bilateral ovaries adherent to the pelvic sidewalls, she was taken.  Left ovary with 2 cysts 1 approximately 1 cm and the other 2 cm.  Both simple in appearance.  Uterus surgically absent.  Cervix not initially visible secondary to bladder adherent over the cervix.  Only the left ureter was noted to have ICG in infrared.  The bladder was quite adherent to the cervix superiorly and anteriorly.  Bladder was backfilled to help identify the contours of the bladder.  Ultimately, the cervix was transected in an effort to mobilize the bladder without injuring it.  During removal of the portion of cervix adherent to the bladder itself, there was unavoidable cystotomy along the midline cephalad to the trigone and ureteral orifices.  This was repaired in multiple layers.  Repair was watertight and cystoscopy at the end of the procedure noted no suture in the bladder and repair more than 1 cm from each ureteral orifice.  Good efflux noted from bilateral ureteral orifices.  The cervix itself had multiple nabothian cysts.  It was sent for frozen section with nothing to freeze gross examination.    Seen on 8/9 for post-op follow-up. Small area along midportion of the cuff had  opened.    Seen on 8/29 after using vaginal estrogen with almost completely closed vaginal cuff noted.   Seen 9/20, cuff healing well, treated empirically for BV.  Interval History: Still struggling with some respiratory symptoms. Overall, urinary symptoms have not improved much.  Had several weeks recently where she slept very well at night and did not have to get up to urinate.  Last night, she woke up 3 times to urinate.  Still has urinary urgency, some of the time can control urine, at other times has urinary incontinence. Between being sick and work, has not been able to make pelvic floor physical therapy appointments. She denies any  pelvic pain, reports normal bowel function.  Past Medical/Surgical History: Past Medical History:  Diagnosis Date   Anemia    Asthma    TID use currently 2.2024   Endometriosis    Tobacco abuse     Past Surgical History:  Procedure Laterality Date   BACK SURGERY     carpel tunnel     CESAREAN SECTION     x3   ENDOMETRIAL ABLATION     EYE SURGERY N/A    [facility] 11/30/2020   OTHER SURGICAL HISTORY  2013   removal of scar tissue (endometriosis) from Pfannenstiel incision   PARTIAL HYSTERECTOMY  2017   cervix, ovaries left in situ   ROBOT ASSISTED TRACHELECTOMY     ROBOT ASSISTED TRACHELECTOMY  03/09/2023   ROBOTIC ASSISTED LAPAROSCOPIC OVARIAN CYSTECTOMY Left 03/09/2023   SPINE SURGERY N/A    [facility] 11/30/2020   TUBAL LIGATION N/A    [facility] 11/30/2020, at time of last section    Family History  Problem Relation Age of Onset   Breast cancer Mother    Heart attack Mother    Hypertension Mother    Diabetes Sister    Breast cancer Maternal Grandmother    Dementia Paternal Grandmother    Colon cancer Neg Hx    Esophageal cancer Neg Hx    Stomach cancer Neg Hx    Colon polyps Neg Hx    Pancreatic cancer Neg Hx     Social History   Socioeconomic History   Marital status: Married    Spouse name: Jaily Featherston   Number of children: 3   Years of education: Associates degree   Highest education level: Associate degree: occupational, Scientist, product/process development, or vocational program  Occupational History   Occupation: Clinical biochemist Rep    Comment: [employer]  Tobacco Use   Smoking status: Every Day    Current packs/day: 0.50    Average packs/day: 0.5 packs/day for 25.3 years (12.7 ttl pk-yrs)    Types: Cigarettes    Start date: 12/03/2000    Passive exposure: Current   Smokeless tobacco: Never  Vaping Use   Vaping status: Never Used  Substance and Sexual Activity   Alcohol use: Yes    Alcohol/week: 2.0 standard drinks of alcohol    Types: 2 Glasses of wine per week     Comment: occasionally   Drug use: Never   Sexual activity: Yes    Partners: Male    Birth control/protection: Surgical    Comment: supracervical hysterectomy, menarche 42yo, sexual debut 42yo  Other Topics Concern   Not on file  Social History Narrative   Not on file   Social Drivers of Health   Financial Resource Strain: Low Risk  (06/17/2023)   Overall Financial Resource Strain (CARDIA)    Difficulty of Paying Living Expenses: Not very hard  Recent Concern:  Financial Resource Strain - Medium Risk (05/06/2023)   Overall Financial Resource Strain (CARDIA)    Difficulty of Paying Living Expenses: Somewhat hard  Food Insecurity: Food Insecurity Present (06/17/2023)   Hunger Vital Sign    Worried About Running Out of Food in the Last Year: Sometimes true    Ran Out of Food in the Last Year: Sometimes true  Transportation Needs: No Transportation Needs (06/17/2023)   PRAPARE - Administrator, Civil Service (Medical): No    Lack of Transportation (Non-Medical): No  Physical Activity: Insufficiently Active (06/17/2023)   Exercise Vital Sign    Days of Exercise per Week: 4 days    Minutes of Exercise per Session: 20 min  Stress: No Stress Concern Present (06/17/2023)   [employer] of Occupational Health - Occupational Stress Questionnaire    Feeling of Stress : Only a little  Social Connections: Moderately Integrated (06/17/2023)   Social Connection and Isolation Panel [NHANES]    Frequency of Communication with Friends and Family: Once a week    Frequency of Social Gatherings with Friends and Family: Once a week    Attends Religious Services: More than 4 times per year    Active Member of [employer] or Organizations: Yes    Attends Engineer, structural: More than 4 times per year    Marital Status: Married    Current Medications:  Current Outpatient Medications:    acetaminophen (TYLENOL) 500 MG tablet, Take 1,000 mg by mouth every 6 (six) hours as needed  for moderate pain., Disp: , Rfl:    Albuterol-Budesonide (AIRSUPRA) 90-80 MCG/ACT AERO, Inhale 2 puffs into the lungs every 6 (six) hours as needed. RESCUE INHALER, Disp: 5.9 g, Rfl: 11   Blood Pressure KIT, Use blood pressure monitor and cuff to check blood pressure at least once daily., Disp: 1 kit, Rfl: 0   conjugated estrogens (PREMARIN) vaginal cream, Place 1 Applicatorful vaginally 3 (three) times a week., Disp: 42.5 g, Rfl: 1   cyclobenzaprine (FLEXERIL) 10 MG tablet, TAKE 1/2 TO 1 TABLET BY MOUTH DAILY AT BEDTIME AS NEEDED FOR MUSCLE PAIN, Disp: 30 tablet, Rfl: 2   fluticasone (FLONASE) 50 MCG/ACT nasal spray, SPRAY 2 SPRAYS INTO EACH NOSTRIL EVERY DAY, Disp: 16 mL, Rfl: 3   fluticasone-salmeterol (ADVAIR) 100-50 MCG/ACT AEPB, Inhale 1 puff into the lungs 2 (two) times daily., Disp: 14 each, Rfl: 3   loratadine (CLARITIN) 10 MG tablet, Take 1 tablet (10 mg total) by mouth daily., Disp: 90 tablet, Rfl: 3   montelukast (SINGULAIR) 10 MG tablet, TAKE 1 TABLET BY MOUTH EVERYDAY AT BEDTIME, Disp: 90 tablet, Rfl: 3   mupirocin ointment (BACTROBAN) 2 %, Apply 1 Application topically daily as needed (wound care)., Disp: , Rfl:    oxybutynin (DITROPAN-XL) 10 MG 24 hr tablet, Take 1 tablet (10 mg total) by mouth at bedtime., Disp: 30 tablet, Rfl: 0   promethazine-dextromethorphan (PROMETHAZINE-DM) 6.25-15 MG/5ML syrup, Take 5 mLs by mouth 4 (four) times daily as needed., Disp: 118 mL, Rfl: 0   senna-docusate (SENOKOT-S) 8.6-50 MG tablet, Take 1 tablet by mouth at bedtime as needed for mild constipation or moderate constipation., Disp: 30 tablet, Rfl: 5   VENTOLIN HFA 108 (90 Base) MCG/ACT inhaler, INHALE 2 PUFFS EVERY 6 HOURS AS NEEDED FOR WHEEZE OR SHORTNESS OF BREATH, Disp: 18 g, Rfl: 1  Review of Symptoms: Pertinent positives as per HPI.  Physical Exam: Deferred given limitations of phone visit.  Laboratory & Radiologic Studies: None new  Assessment & Plan: Kimberly Armstrong is a 42  y.o. woman with woman with cycle pelvic pain after supracervical hysterectomy now s/p robotic trachelectomy, left ovarian cystectomy.  Partial dehiscence of the vaginal cuff diagnosed on 8/9.  Given ongoing urinary symptoms, we discussed again referral to urology or urogynecology.  She was open to this.  Referral placed to urogynecology.   I discussed the assessment and treatment plan with the patient. The patient was provided with an opportunity to ask questions and all were answered. The patient agreed with the plan and demonstrated an understanding of the instructions.   The patient was advised to call back or see an in-person evaluation if the symptoms worsen or if the condition fails to improve as anticipated.   8 minutes of total time was spent for this patient encounter, including preparation, phone counseling with the patient and coordination of care, and documentation of the encounter.   Eugene Garnet, MD  Division of Gynecologic Oncology  Department of Obstetrics and Gynecology  [facility]

## 2023-09-06 ENCOUNTER — Encounter: Payer: Medicaid Other | Admitting: Physical Therapy

## 2023-09-12 ENCOUNTER — Encounter: Payer: Medicaid Other | Admitting: Physical Therapy

## 2023-09-15 ENCOUNTER — Telehealth: Payer: BLUE CROSS/BLUE SHIELD | Admitting: Physician Assistant

## 2023-09-15 ENCOUNTER — Telehealth: Payer: Medicaid Other | Admitting: Physician Assistant

## 2023-09-15 DIAGNOSIS — J4541 Moderate persistent asthma with (acute) exacerbation: Secondary | ICD-10-CM | POA: Diagnosis not present

## 2023-09-15 DIAGNOSIS — R079 Chest pain, unspecified: Secondary | ICD-10-CM

## 2023-09-15 MED ORDER — BENZONATATE 100 MG PO CAPS: 100.0000 mg | ORAL_CAPSULE | Freq: Three times a day (TID) | ORAL | 0 refills | Status: DC | PRN

## 2023-09-15 MED ORDER — PREDNISONE 20 MG PO TABS
40.0000 mg | ORAL_TABLET | Freq: Every day | ORAL | 0 refills | Status: DC
Start: 2023-09-15 — End: 2023-10-18

## 2023-09-15 MED ORDER — AZITHROMYCIN 250 MG PO TABS: ORAL_TABLET | ORAL | 0 refills | Status: AC

## 2023-09-15 NOTE — Patient Instructions (Signed)
Owens Loffler, thank you for joining Piedad Climes, PA-C for today's virtual visit.  While this provider is not your primary care provider (PCP), if your PCP is located in our provider database this encounter information will be shared with them immediately following your visit.   A [facility] MyChart account gives you access to today's visit and all your visits, tests, and labs performed at [facility] " click here if you don't have a [facility] MyChart account or go to [email or web address]  Consent: (Patient) Owens Loffler provided verbal consent for this virtual visit at the beginning of the encounter.  Current Medications:  Current Outpatient Medications:    acetaminophen (TYLENOL) 500 MG tablet, Take 1,000 mg by mouth every 6 (six) hours as needed for moderate pain., Disp: , Rfl:    Albuterol-Budesonide (AIRSUPRA) 90-80 MCG/ACT AERO, Inhale 2 puffs into the lungs every 6 (six) hours as needed. RESCUE INHALER, Disp: 5.9 g, Rfl: 11   Blood Pressure KIT, Use blood pressure monitor and cuff to check blood pressure at least once daily., Disp: 1 kit, Rfl: 0   conjugated estrogens (PREMARIN) vaginal cream, Place 1 Applicatorful vaginally 3 (three) times a week., Disp: 42.5 g, Rfl: 1   cyclobenzaprine (FLEXERIL) 10 MG tablet, TAKE 1/2 TO 1 TABLET BY MOUTH DAILY AT BEDTIME AS NEEDED FOR MUSCLE PAIN, Disp: 30 tablet, Rfl: 2   fluticasone (FLONASE) 50 MCG/ACT nasal spray, SPRAY 2 SPRAYS INTO EACH NOSTRIL EVERY DAY, Disp: 16 mL, Rfl: 3   fluticasone-salmeterol (ADVAIR) 100-50 MCG/ACT AEPB, Inhale 1 puff into the lungs 2 (two) times daily., Disp: 14 each, Rfl: 3   loratadine (CLARITIN) 10 MG tablet, Take 1 tablet (10 mg total) by mouth daily., Disp: 90 tablet, Rfl: 3   montelukast (SINGULAIR) 10 MG tablet, TAKE 1 TABLET BY MOUTH EVERYDAY AT BEDTIME, Disp: 90 tablet, Rfl: 3   mupirocin ointment (BACTROBAN) 2 %, Apply 1 Application topically daily as needed  (wound care)., Disp: , Rfl:    oxybutynin (DITROPAN-XL) 10 MG 24 hr tablet, Take 1 tablet (10 mg total) by mouth at bedtime., Disp: 30 tablet, Rfl: 0   promethazine-dextromethorphan (PROMETHAZINE-DM) 6.25-15 MG/5ML syrup, Take 5 mLs by mouth 4 (four) times daily as needed., Disp: 118 mL, Rfl: 0   senna-docusate (SENOKOT-S) 8.6-50 MG tablet, Take 1 tablet by mouth at bedtime as needed for mild constipation or moderate constipation., Disp: 30 tablet, Rfl: 5   VENTOLIN HFA 108 (90 Base) MCG/ACT inhaler, INHALE 2 PUFFS EVERY 6 HOURS AS NEEDED FOR WHEEZE OR SHORTNESS OF BREATH, Disp: 18 g, Rfl: 1   Medications ordered in this encounter:  No orders of the defined types were placed in this encounter.    *If you need refills on other medications prior to your next appointment, please contact your pharmacy*  Follow-Up: Call back or seek an in-person evaluation if the symptoms worsen or if the condition fails to improve as anticipated.  [facility] [phone]  Other Instructions Take antibiotic (Azithromycin) as directed.  Increase fluids.  Get plenty of rest. Use Mucinex for congestion. Take the prednisone and cough medication as directed. Take a daily probiotic (I recommend Align or Culturelle, but even Activia Yogurt may be beneficial).  A humidifier placed in the bedroom may offer some relief for a dry, scratchy throat of nasal irritation.  Read information below on acute bronchitis. Please call or return to clinic if symptoms are not improving.  Acute Bronchitis Bronchitis is when the airways  that extend from the windpipe into the lungs get red, puffy, and painful (inflamed). Bronchitis often causes thick spit (mucus) to develop. This leads to a cough. A cough is the most common symptom of bronchitis. In acute bronchitis, the condition usually begins suddenly and goes away over time (usually in 2 weeks). Smoking, allergies, and asthma can make bronchitis worse. Repeated episodes of  bronchitis may cause more lung problems.  HOME CARE Rest. Drink enough fluids to keep your pee (urine) clear or pale yellow (unless you need to limit fluids as told by your doctor). Only take over-the-counter or prescription medicines as told by your doctor. Avoid smoking and secondhand smoke. These can make bronchitis worse. If you are a smoker, think about using nicotine gum or skin patches. Quitting smoking will help your lungs heal faster. Reduce the chance of getting bronchitis again by: Washing your hands often. Avoiding people with cold symptoms. Trying not to touch your hands to your mouth, nose, or eyes. Follow up with your doctor as told.  GET HELP IF: Your symptoms do not improve after 1 week of treatment. Symptoms include: Cough. Fever. Coughing up thick spit. Body aches. Chest congestion. Chills. Shortness of breath. Sore throat.  GET HELP RIGHT AWAY IF:  You have an increased fever. You have chills. You have severe shortness of breath. You have bloody thick spit (sputum). You throw up (vomit) often. You lose too much body fluid (dehydration). You have a severe headache. You faint.  MAKE SURE YOU:  Understand these instructions. Will watch your condition. Will get help right away if you are not doing well or get worse. Document Released: 02/02/2008 Document Revised: 04/18/2013 Document Reviewed: 02/06/2013 [facility] Patient Information 2015 [place], [place]. This information is not intended to replace advice given to you by your health care provider. Make sure you discuss any questions you have with your health care provider.    If you have been instructed to have an in-person evaluation today at a local Urgent Care facility, please use the link below. It will take you to a list of all of our available [facility], including address, phone number and hours of operation. Please do not delay care.  [facility]  If you or a family member  do not have a primary care provider, use the link below to schedule a visit and establish care. When you choose a [facility] primary care physician or advanced practice provider, you gain a long-term partner in health. Find a Primary Care Provider  Learn more about [facility]'s in-office and virtual care options: [place] - Get Care Now

## 2023-09-15 NOTE — Progress Notes (Signed)
Virtual Visit Consent   Jadin Garbisch, you are scheduled for a virtual visit with a [facility] provider today. Just as with appointments in the office, your consent must be obtained to participate. Your consent will be active for this visit and any virtual visit you may have with one of our providers in the next 365 days. If you have a MyChart account, a copy of this consent can be sent to you electronically.  As this is a virtual visit, video technology does not allow for your provider to perform a traditional examination. This may limit your provider's ability to fully assess your condition. If your provider identifies any concerns that need to be evaluated in person or the need to arrange testing (such as labs, EKG, etc.), we will make arrangements to do so. Although advances in technology are sophisticated, we cannot ensure that it will always work on either your end or our end. If the connection with a video visit is poor, the visit may have to be switched to a telephone visit. With either a video or telephone visit, we are not always able to ensure that we have a secure connection.  By engaging in this virtual visit, you consent to the provision of healthcare and authorize for your insurance to be billed (if applicable) for the services provided during this visit. Depending on your insurance coverage, you may receive a charge related to this service.  I need to obtain your verbal consent now. Are you willing to proceed with your visit today? Taesha Charlene Hasbun has provided verbal consent on 09/15/2023 for a virtual visit (video or telephone). Piedad Climes, [place]  Date: 09/15/2023 9:34 AM  Virtual Visit via Video Note   I, Piedad Climes, connected with  Stelle Strome  ([id number], 10-26-1980) on 09/15/23 at  9:30 AM EST by a video-enabled telemedicine application and verified that I am speaking with the correct person using two  identifiers.  Location: Patient: Virtual Visit Location Patient: Home Provider: Virtual Visit Location Provider: Home Office   I discussed the limitations of evaluation and management by telemedicine and the availability of in person appointments. The patient expressed understanding and agreed to proceed.    History of Present Illness: Nasheka Njie is a 43 y.o. who identifies as a female who was assigned female at birth, with history of asthma, and is being seen today for concern of an asthma exacerbation. Notes over the past week with chest tightness, wheezing and tenderness due to the tightness. Notes this is following a bout of viral bronchitis with continued dry cough that is now occasionally productive of thick brown phlegm.  Denies fever, chills or aches. OTC -- Tylenol, Ibuprofen. Is using her regular allergy medications.   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   Raynaud's phenomenon without gangrene 10/05/2021   Seasonal allergies 09/07/2021   Cold sore 09/07/2021   Body mass index (BMI) of 33.0-33.9 in adult 03/15/2021   Tobacco use disorder, continuous 12/03/2020   Moderate persistent asthma 11/29/1978    Allergies:  Allergies  Allergen Reactions   Sulfa Antibiotics  Hives and Rash   Medications:  Current Outpatient Medications:    azithromycin (ZITHROMAX) 250 MG tablet, Take 2 tablets on day 1, then 1 tablet daily on days 2 through 5, Disp: 6 tablet, Rfl: 0   benzonatate (TESSALON) 100 MG capsule, Take 1 capsule (100 mg total) by mouth 3 (three) times daily as needed for  cough., Disp: 30 capsule, Rfl: 0   predniSONE (DELTASONE) 20 MG tablet, Take 2 tablets (40 mg total) by mouth daily with breakfast., Disp: 10 tablet, Rfl: 0   acetaminophen (TYLENOL) 500 MG tablet, Take 1,000 mg by mouth every 6 (six) hours as needed for moderate pain., Disp: , Rfl:    Albuterol-Budesonide (AIRSUPRA) 90-80 MCG/ACT AERO, Inhale 2 puffs into the lungs every 6 (six) hours as needed. RESCUE INHALER, Disp: 5.9 g, Rfl: 11   Blood Pressure KIT, Use blood pressure monitor and cuff to check blood pressure at least once daily., Disp: 1 kit, Rfl: 0   conjugated estrogens (PREMARIN) vaginal cream, Place 1 Applicatorful vaginally 3 (three) times a week., Disp: 42.5 g, Rfl: 1   cyclobenzaprine (FLEXERIL) 10 MG tablet, TAKE 1/2 TO 1 TABLET BY MOUTH DAILY AT BEDTIME AS NEEDED FOR MUSCLE PAIN, Disp: 30 tablet, Rfl: 2   fluticasone (FLONASE) 50 MCG/ACT nasal spray, SPRAY 2 SPRAYS INTO EACH NOSTRIL EVERY DAY, Disp: 16 mL, Rfl: 3   fluticasone-salmeterol (ADVAIR) 100-50 MCG/ACT AEPB, Inhale 1 puff into the lungs 2 (two) times daily., Disp: 14 each, Rfl: 3   loratadine (CLARITIN) 10 MG tablet, Take 1 tablet (10 mg total) by mouth daily., Disp: 90 tablet, Rfl: 3   montelukast (SINGULAIR) 10 MG tablet, TAKE 1 TABLET BY MOUTH EVERYDAY AT BEDTIME, Disp: 90 tablet, Rfl: 3   mupirocin ointment (BACTROBAN) 2 %, Apply 1 Application topically daily as needed (wound care)., Disp: , Rfl:    oxybutynin (DITROPAN-XL) 10 MG 24 hr tablet, Take 1 tablet (10 mg total) by mouth at bedtime., Disp: 30 tablet, Rfl: 0   senna-docusate (SENOKOT-S) 8.6-50 MG tablet, Take 1 tablet by mouth at bedtime as needed for mild constipation or moderate constipation., Disp: 30 tablet, Rfl: 5   VENTOLIN HFA 108 (90 Base) MCG/ACT inhaler, INHALE 2 PUFFS EVERY 6 HOURS AS NEEDED FOR WHEEZE OR SHORTNESS OF BREATH, Disp: 18 g, Rfl: 1  Observations/Objective: Patient is well-developed, well-nourished in no acute distress.  Resting comfortably   at home.  Head is normocephalic, atraumatic.  No labored breathing.  Speech is clear and coherent with logical content.  Patient is alert and oriented at baseline.   Assessment and Plan: 1. Moderate persistent asthma with exacerbation - benzonatate (TESSALON) 100 MG capsule; Take 1 capsule (100 mg total) by mouth 3 (three) times daily as needed for cough.  Dispense: 30 capsule; Refill: 0 - predniSONE (DELTASONE) 20 MG tablet; Take 2 tablets (40 mg total) by mouth daily with breakfast.  Dispense: 10 tablet; Refill: 0  2. Moderate persistent asthmatic bronchitis with acute exacerbation (Primary) - azithromycin (ZITHROMAX) 250 MG tablet; Take 2 tablets on day 1, then 1 tablet daily on days 2 through 5  Dispense: 6 tablet; Refill: 0 - benzonatate (TESSALON) 100 MG capsule; Take 1 capsule (100 mg total) by mouth 3 (three) times daily as needed for cough.  Dispense: 30 capsule; Refill: 0 - predniSONE (DELTASONE) 20 MG tablet; Take 2 tablets (40 mg total) by mouth daily with breakfast.  Dispense: 10 tablet; Refill: 0  Supportive measures and OTC medications reviewed. Azithromycin, Tessalon and Prednisone  per orders. Continue chronic asthma medications. Strict in person evaluation precautions reviewed.   Follow Up Instructions: I discussed the assessment and treatment plan with the patient. The patient was provided an opportunity to ask questions and all were answered. The patient agreed with the plan and demonstrated an understanding of the instructions.  A copy of instructions were sent to the patient via MyChart unless otherwise noted below.   The patient was advised to call back or seek an in-person evaluation if the symptoms worsen or if the condition fails to improve as anticipated.    Piedad Climes, PA-C

## 2023-09-15 NOTE — Progress Notes (Signed)
  Because chest pain, I feel your condition warrants further evaluation and I recommend that you be seen in a face-to-face visit.   NOTE: There will be NO CHARGE for this E-Visit   If you are having a true medical emergency, please call 911.     For an urgent face to face visit, [facility] has multiple urgent care centers for your convenience.  Click the link below for the full list of locations and hours, walk-in wait times, appointment scheduling options and driving directions:  Urgent Care - [place], [place], [place], [place], [place], [place]  [place]     Your MyChart E-visit questionnaire answers were reviewed by a board certified advanced clinical practitioner to complete your personal care plan based on your specific symptoms.    Thank you for using e-Visits.

## 2023-09-19 ENCOUNTER — Encounter: Payer: Medicaid Other | Admitting: Physical Therapy

## 2023-09-20 ENCOUNTER — Other Ambulatory Visit: Payer: Self-pay | Admitting: Family Medicine

## 2023-09-20 DIAGNOSIS — Z1231 Encounter for screening mammogram for malignant neoplasm of breast: Secondary | ICD-10-CM

## 2023-09-26 ENCOUNTER — Encounter: Payer: Medicaid Other | Admitting: Physical Therapy

## 2023-10-04 ENCOUNTER — Ambulatory Visit (INDEPENDENT_AMBULATORY_CARE_PROVIDER_SITE_OTHER): Payer: BC Managed Care – PPO | Admitting: Family Medicine

## 2023-10-04 ENCOUNTER — Encounter: Payer: Self-pay | Admitting: Family Medicine

## 2023-10-04 VITALS — BP 150/96 | HR 76 | Ht 69.0 in | Wt 237.8 lb

## 2023-10-04 DIAGNOSIS — K5900 Constipation, unspecified: Secondary | ICD-10-CM

## 2023-10-04 DIAGNOSIS — N3941 Urge incontinence: Secondary | ICD-10-CM | POA: Insufficient documentation

## 2023-10-04 DIAGNOSIS — M79644 Pain in right finger(s): Secondary | ICD-10-CM

## 2023-10-04 MED ORDER — SENNOSIDES-DOCUSATE SODIUM 8.6-50 MG PO TABS: 1.0000 | ORAL_TABLET | Freq: Every evening | ORAL | 5 refills | Status: DC | PRN

## 2023-10-04 NOTE — Patient Instructions (Addendum)
 For the next several days, ice your hand and rest as much as possible.  It may be helpful to get a brace to prevent you from moving your thumb too much.  You can also follow this schedule of Tylenol  and ibuprofen  to improve the pain and reduce inflammation.  Example: 6am: Tylenol  500 mg 8am: ibuprofen  400 mg 10am: Tylenol  500 mg 12pm: ibuprofen  400 mg  2pm: Tylenol  500 mg 4pm: ibuprofen  400 mg 6pm: Tylenol  500 mg 8pm: ibuprofen  400 mg 10pm: Tylenol  500 mg  OR Tylenol  1000 mg + ibuprofen  400 mg to get you through the night with a dose of ibuprofen  sometime in the middle of the night    If you have not had any improvement by Friday, please send me a message so that we can get an x-ray.  Alternatively, you can go to an orthopedic urgent care at that time to get both an x-ray and treatment on the same day.

## 2023-10-04 NOTE — Progress Notes (Signed)
 Established Patient Office Visit  Subjective   Patient ID: Kimberly Armstrong, female    DOB: 1980-12-29  Age: 43 y.o. MRN: [id number]  Chief Complaint  Patient presents with   Follow Up Incontinence    HPI Kimberly Armstrong is a 43 y.o. female presenting today for follow up of urge incontinence.  At last appointment, she was still experiencing urinary frequency and urgency requiring work restrictions of shorter 4 to 6-hour shift daily, bathroom breaks as needed for urge incontinence.  Her last follow-up with gynecology was on 08/26/2023.  Given her ongoing urinary symptoms at that time, referral was placed to urogynecology for further evaluation and management.  Appointment with urogynecology is scheduled for 10/07/2023.  She is still experiencing urge incontinence, but she does report that she leaks urine less often.  She was also able to change to a part-time position with 5-hour shifts which will make work much easier.  Her other concern today is right thumb pain.  She was helping a family member clean over the weekend and was doing heavy sweeping, mopping, etc. and later developed pain and tenderness over her right thenar.  She reports that pinching motions are painful, and she has tenderness to palpation over the radial surface of her hand.  Outpatient Medications Prior to Visit  Medication Sig   acetaminophen  (TYLENOL ) 500 MG tablet Take 1,000 mg by mouth every 6 (six) hours as needed for moderate pain.   Albuterol -Budesonide  (AIRSUPRA ) 90-80 MCG/ACT AERO Inhale 2 puffs into the lungs every 6 (six) hours as needed. RESCUE INHALER   benzonatate  (TESSALON ) 100 MG capsule Take 1 capsule (100 mg total) by mouth 3 (three) times daily as needed for cough.   Blood Pressure KIT Use blood pressure monitor and cuff to check blood pressure at least once daily.   conjugated estrogens  (PREMARIN ) vaginal cream Place 1 Applicatorful vaginally 3 (three) times a week.   cyclobenzaprine   (FLEXERIL ) 10 MG tablet TAKE 1/2 TO 1 TABLET BY MOUTH DAILY AT BEDTIME AS NEEDED FOR MUSCLE PAIN   fluticasone  (FLONASE ) 50 MCG/ACT nasal spray SPRAY 2 SPRAYS INTO EACH NOSTRIL EVERY DAY   fluticasone -salmeterol (ADVAIR) 100-50 MCG/ACT AEPB Inhale 1 puff into the lungs 2 (two) times daily.   loratadine  (CLARITIN ) 10 MG tablet Take 1 tablet (10 mg total) by mouth daily.   montelukast  (SINGULAIR ) 10 MG tablet TAKE 1 TABLET BY MOUTH EVERYDAY AT BEDTIME   mupirocin  ointment (BACTROBAN ) 2 % Apply 1 Application topically daily as needed (wound care).   oxybutynin  (DITROPAN -XL) 10 MG 24 hr tablet Take 1 tablet (10 mg total) by mouth at bedtime.   predniSONE  (DELTASONE ) 20 MG tablet Take 2 tablets (40 mg total) by mouth daily with breakfast.   VENTOLIN  HFA 108 (90 Base) MCG/ACT inhaler INHALE 2 PUFFS EVERY 6 HOURS AS NEEDED FOR WHEEZE OR SHORTNESS OF BREATH   [DISCONTINUED] senna-docusate (SENOKOT-S) 8.6-50 MG tablet Take 1 tablet by mouth at bedtime as needed for mild constipation or moderate constipation.   No facility-administered medications prior to visit.    ROS Negative unless otherwise noted in HPI   Objective:     BP (!) 150/96   Pulse 76   Ht 5' 9 (1.753 m)   Wt 237 lb 12 oz (107.8 kg)   SpO2 97%   BMI 35.11 kg/m   Physical Exam Constitutional:      General: She is not in acute distress.    Appearance: Normal appearance.  HENT:     Head:  Normocephalic and atraumatic.  Cardiovascular:     Rate and Rhythm: Normal rate and regular rhythm.     Heart sounds: No murmur heard.    No friction rub. No gallop.  Pulmonary:     Effort: Pulmonary effort is normal. No respiratory distress.     Breath sounds: No wheezing, rhonchi or rales.  Musculoskeletal:     Right hand: Tenderness present. Normal range of motion.     Left hand: Normal range of motion.       Arms:     Comments: TTP over lateral thumb surface  Skin:    General: Skin is warm and dry.  Neurological:     Mental  Status: She is alert and oriented to person, place, and time.      Assessment & Plan:  Urge incontinence Assessment & Plan: Continue follow-up as planned with urogynecology.  We discussed that pelvic floor physical therapy would likely be a beneficial part of her management moving forward given her current symptoms.  FMLA currently through 10/28/2023, patient will contact if that needs to be extended to allow recovery time after developing plan with urogynecology.   Constipation, unspecified constipation type -     Sennosides-Docusate Sodium ; Take 1 tablet by mouth at bedtime as needed for mild constipation or moderate constipation.  Dispense: 30 tablet; Refill: 5  Thumb pain, right  Recommend conservative treatment with rest, ice, Tylenol /ibuprofen .  Recommend a brace to keep her thumb from moving.  Patient will follow-up in 3 days if she has not had any improvement so we can move forward with x-rays.  Differential includes thumb strain/sprain versus skiers thumb.  Patient verbalized understanding and is agreeable to this plan.  Return in about 3 months (around 01/01/2024) for annual physical, fasting labs 1 week before.    Joesph DELENA Sear, PA

## 2023-10-04 NOTE — Assessment & Plan Note (Signed)
 Continue follow-up as planned with urogynecology.  We discussed that pelvic floor physical therapy would likely be a beneficial part of her management moving forward given her current symptoms.  FMLA currently through 10/28/2023, patient will contact if that needs to be extended to allow recovery time after developing plan with urogynecology.

## 2023-10-05 DIAGNOSIS — Z1231 Encounter for screening mammogram for malignant neoplasm of breast: Secondary | ICD-10-CM

## 2023-10-06 ENCOUNTER — Encounter: Payer: Self-pay | Admitting: Family Medicine

## 2023-10-06 DIAGNOSIS — M79644 Pain in right finger(s): Secondary | ICD-10-CM

## 2023-10-07 ENCOUNTER — Ambulatory Visit
Admission: RE | Admit: 2023-10-07 | Discharge: 2023-10-07 | Disposition: A | Discharge status: Home / Self Care | Payer: BC Managed Care – PPO | Source: Ambulatory Visit | Attending: Family Medicine | Admitting: Family Medicine

## 2023-10-07 ENCOUNTER — Ambulatory Visit (INDEPENDENT_AMBULATORY_CARE_PROVIDER_SITE_OTHER): Payer: BC Managed Care – PPO | Admitting: Obstetrics and Gynecology

## 2023-10-07 ENCOUNTER — Encounter: Payer: Self-pay | Admitting: Obstetrics and Gynecology

## 2023-10-07 VITALS — BP 152/121 | HR 75 | Ht 68.0 in | Wt 235.0 lb

## 2023-10-07 DIAGNOSIS — N393 Stress incontinence (female) (male): Secondary | ICD-10-CM | POA: Diagnosis not present

## 2023-10-07 DIAGNOSIS — N3281 Overactive bladder: Secondary | ICD-10-CM | POA: Insufficient documentation

## 2023-10-07 DIAGNOSIS — R350 Frequency of micturition: Secondary | ICD-10-CM

## 2023-10-07 DIAGNOSIS — M79644 Pain in right finger(s): Secondary | ICD-10-CM | POA: Diagnosis not present

## 2023-10-07 LAB — POCT URINALYSIS DIPSTICK
Bilirubin, UA: NEGATIVE
Glucose, UA: NEGATIVE
Ketones, UA: NEGATIVE
Leukocytes, UA: NEGATIVE
Nitrite, UA: NEGATIVE
Protein, UA: NEGATIVE
Spec Grav, UA: 1.01 (ref 1.010–1.025)
Urobilinogen, UA: 0.2 U/dL
pH, UA: 7 (ref 5.0–8.0)

## 2023-10-07 NOTE — Patient Instructions (Signed)

## 2023-10-07 NOTE — Assessment & Plan Note (Signed)
-   We discussed the symptoms of overactive bladder (OAB), which include urinary urgency, urinary frequency, nocturia, with or without urge incontinence.  While we do not know the exact etiology of OAB, several treatment options exist. We discussed management including behavioral therapy (decreasing bladder irritants, urge suppression strategies, timed voids, bladder retraining), physical therapy, medication; for refractory cases posterior tibial nerve stimulation, sacral neuromodulation, and intravesical botulinum toxin injection.  - She would prefer conservative management at this time, so will work on dietary changes and PT exercises. List provided of bladder irritants to avoid.  - Last A1c in March 2024 showed prediabetes. She does have a family history of DM and a personal history of gestational diabetes. We discussed that she should follow up with PCP for DM screening as elevated blood glucose could be contributing to frequent urination.

## 2023-10-07 NOTE — Assessment & Plan Note (Signed)
-   For treatment of stress urinary incontinence,  non-surgical options include expectant management, weight loss, physical therapy, as well as a pessary.  Surgical options include a midurethral sling, Burch urethropexy, and transurethral injection of a bulking agent. - She does not feel symptoms are bothersome enough for further treatment at this time. She will continue to do pelvic floor exercises that she learned in PT at home.

## 2023-10-07 NOTE — Progress Notes (Signed)
 New Patient Evaluation and Consultation  Referring Provider: Viktoria Comer SAUNDERS, MD PCP: Wallace Joesph LABOR, PA Date of Service: 10/07/2023  SUBJECTIVE Chief Complaint: New Patient (Initial Visit) (Kaede Raymon Coppola is a 43 y.o. female here for a consult for urinary incontinence./)  History of Present Illness: Arsenia Goracke is a 43 y.o. Native [place] or Other [place] Islander female seen in consultation at the request of Dr Viktoria for evaluation of urge incontinene.    Review of records significant for: 03/09/23: Robotic-assisted trachelectomy, lysis of adhesions, unavoidable cystotomy and primary repair, cystoscopy, left ovarian cystectomies x2 (Dr Viktoria) Cystoscopy with ureteral ICG injection (Dr. Devere)  Has had difficulty making appts with pelvic PT  Urinary Symptoms: Leaks urine with cough/ sneeze, laughing, exercise, lifting, with a full bladder, with urgency, and without sensation Leaks 2-4 time(s) per day.  Pad use:  liners/ mini-pads  Patient is bothered by UI symptoms. Did have leakage with cough and sneeze prior to the surgery but not the urgency symptoms.  She takes oxybutynin  10mg  XL daily, but only takes it when she feels like the urgency is stronger.   Day time voids 10-14.  Nocturia: 2-3 times per night to void. Voiding dysfunction:  does not empty bladder well.  Patient does not use a catheter to empty bladder.  When urinating, patient feels a weak stream, difficulty starting urine stream, dribbling after finishing, and the need to urinate multiple times in a row Drinks: 20oz coffee with creamer, 90 oz water  with lemon per day, occasional soda a few times a week  UTIs:  0  UTI's in the last year.   Denies history of blood in urine and kidney or bladder stones   Pelvic Organ Prolapse Symptoms:                  Patient Denies a feeling of a bulge the vaginal area.   Bowel Symptom: Bowel movements: 1 time(s) per day Stool consistency:  hard Straining: yes.  Splinting: no.  Incomplete evacuation: yes.  Patient Denies accidental bowel leakage / fecal incontinence Bowel regimen: stool softener and miralax, lately has made it too soft or runny so has not needed recently.    Sexual Function Sexually active: yes.  Sexual orientation:  heterosexual Pain with sex: No  Pelvic Pain Denies pelvic pain   Past Medical History:  Past Medical History:  Diagnosis Date   Anemia    Asthma    TID use currently 2.2024   Endometriosis    Tobacco abuse      Past Surgical History:   Past Surgical History:  Procedure Laterality Date   BACK SURGERY     carpel tunnel     CESAREAN SECTION     x3   ENDOMETRIAL ABLATION     EYE SURGERY N/A    [employer] 11/30/2020   OTHER SURGICAL HISTORY  2013   removal of scar tissue (endometriosis) from Pfannenstiel incision   PARTIAL HYSTERECTOMY  2017   cervix, ovaries left in situ   ROBOT ASSISTED TRACHELECTOMY     ROBOT ASSISTED TRACHELECTOMY  03/09/2023   ROBOTIC ASSISTED LAPAROSCOPIC OVARIAN CYSTECTOMY Left 03/09/2023   SPINE SURGERY N/A    [employer] 11/30/2020   TUBAL LIGATION N/A    [employer] 11/30/2020, at time of last section     Past OB/GYN History: OB History  Gravida Para Term Preterm AB Living  4 3 3  0 1 3  SAB IAB Ectopic Multiple Live Births  1 0 0 0 3    #  Outcome Date GA Lbr Len/2nd Weight Sex Type Anes PTL Lv  4 Term      CS-LTranv     3 Term      CS-LTranv     2 Term      CS-LTranv     1 SAB            S/p hysterectomy    Component Value Date/Time   DIAGPAP  10/15/2022 1231    - Negative for intraepithelial lesion or malignancy (NILM)   DIAGPAP  03/05/2021 1125    - Negative for intraepithelial lesion or malignancy (NILM)   HPVHIGH Negative 10/15/2022 1231   HPVHIGH Negative 03/05/2021 1125   ADEQPAP  10/15/2022 1231    Satisfactory for evaluation; transformation zone component PRESENT.   ADEQPAP  03/05/2021 1125    Satisfactory for evaluation;  transformation zone component ABSENT.    Medications: Patient has a current medication list which includes the following prescription(s): acetaminophen , airsupra , benzonatate , blood pressure, conjugated estrogens , cyclobenzaprine , fluticasone , fluticasone -salmeterol, loratadine , montelukast , mupirocin  ointment, oxybutynin , prednisone , senna-docusate, and ventolin  hfa.   Allergies: Patient is allergic to sulfa  antibiotics.   Social History:  Social History   Tobacco Use   Smoking status: Every Day    Current packs/day: 0.50    Average packs/day: 0.5 packs/day for 25.4 years (12.7 ttl pk-yrs)    Types: Cigarettes    Start date: 12/03/2000    Passive exposure: Current   Smokeless tobacco: Never  Vaping Use   Vaping status: Never Used  Substance Use Topics   Alcohol use: Yes    Alcohol/week: 2.0 standard drinks of alcohol    Types: 2 Glasses of wine per week    Comment: occasionally   Drug use: Never    Relationship status: married Patient lives with husband and children.   Patient is employed as s museum/gallery conservator. Regular exercise: No History of abuse: No  Family History:   Family History  Problem Relation Age of Onset   Breast cancer Mother    Heart attack Mother    Hypertension Mother    Diabetes Sister    Breast cancer Maternal Grandmother    Dementia Paternal Grandmother    Colon cancer Neg Hx    Esophageal cancer Neg Hx    Stomach cancer Neg Hx    Colon polyps Neg Hx    Pancreatic cancer Neg Hx      Review of Systems: Review of Systems  Constitutional:  Positive for malaise/fatigue. Negative for fever and weight loss.  Respiratory:  Positive for cough. Negative for shortness of breath and wheezing.   Cardiovascular:  Negative for chest pain, palpitations and leg swelling.  Gastrointestinal:  Negative for abdominal pain and blood in stool.  Genitourinary:  Negative for dysuria.  Musculoskeletal:  Negative for myalgias.  Skin:  Negative for rash.   Neurological:  Negative for dizziness and headaches.  Endo/Heme/Allergies:  Does not bruise/bleed easily.  Psychiatric/Behavioral:  Negative for depression. The patient is not nervous/anxious.      OBJECTIVE Physical Exam: Vitals:   10/07/23 1029  BP: (!) 152/121  Pulse: 75  Weight: 235 lb (106.6 kg)  Height: 5' 8 (1.727 m)    Physical Exam Vitals reviewed. Exam conducted with a chaperone present.  Constitutional:      General: She is not in acute distress. Pulmonary:     Effort: Pulmonary effort is normal.  Abdominal:     General: There is no distension.     Palpations: Abdomen is soft.  Tenderness: There is no abdominal tenderness. There is no rebound.  Musculoskeletal:        General: No swelling. Normal range of motion.  Skin:    General: Skin is warm and dry.     Findings: No rash.  Neurological:     Mental Status: She is alert and oriented to person, place, and time.  Psychiatric:        Mood and Affect: Mood normal.        Behavior: Behavior normal.      GU / Detailed Urogynecologic Evaluation:  Pelvic Exam: Normal external female genitalia; Bartholin's and Skene's glands normal in appearance; urethral meatus normal in appearance, no urethral masses or discharge.   CST: negative  s/p hysterectomy: Speculum exam reveals normal vaginal mucosa without  atrophy and normal vaginal cuff.  Adnexa no mass, fullness, tenderness.     Pelvic floor strength III/V  Pelvic floor musculature: Right levator non-tender, Right obturator non-tender, Left levator non-tender, Left obturator non-tender  POP-Q:   POP-Q  -2.5                                            Aa   -2.5                                           Ba  -8                                              C   3                                            Gh  5.5                                            Pb  8.5                                            tvl   -3                                             Ap  -3                                            Bp                                                 D  Rectal Exam:  External hemorrhoids present  Post-Void Residual (PVR) by Bladder Scan: In order to evaluate bladder emptying, we discussed obtaining a postvoid residual and patient agreed to this procedure.  Procedure: The ultrasound unit was placed on the patient's abdomen in the suprapubic region after the patient had voided.    Post Void Residual - 10/07/23 1046       Post Void Residual   Post Void Residual 8 mL              Laboratory Results: Lab Results  Component Value Date   COLORU Yellow 10/07/2023   CLARITYU Clear 10/07/2023   GLUCOSEUR Negative 10/07/2023   BILIRUBINUR Negative 10/07/2023   KETONESU Negative 10/07/2023   SPECGRAV 1.010 10/07/2023   RBCUR Trace-Iysed 10/07/2023   PHUR 7.0 10/07/2023   PROTEINUR Negative 10/07/2023   UROBILINOGEN 0.2 10/07/2023   LEUKOCYTESUR Negative 10/07/2023    Lab Results  Component Value Date   CREATININE 0.91 07/12/2023   CREATININE 0.89 03/10/2023   CREATININE 0.86 03/02/2023    Lab Results  Component Value Date   HGBA1C 5.9 (H) 11/03/2022    Lab Results  Component Value Date   HGB 14.9 07/12/2023     ASSESSMENT AND PLAN Ms. Leclaire is a 44 y.o. with:  1. Overactive bladder   2. Urinary frequency   3. SUI (stress urinary incontinence, female)     Overactive bladder Assessment & Plan: - We discussed the symptoms of overactive bladder (OAB), which include urinary urgency, urinary frequency, nocturia, with or without urge incontinence.  While we do not know the exact etiology of OAB, several treatment options exist. We discussed management including behavioral therapy (decreasing bladder irritants, urge suppression strategies, timed voids, bladder retraining), physical therapy, medication; for refractory cases posterior tibial nerve stimulation, sacral neuromodulation, and  intravesical botulinum toxin injection.  - She would prefer conservative management at this time, so will work on dietary changes and PT exercises. List provided of bladder irritants to avoid.  - Last A1c in March 2024 showed prediabetes. She does have a family history of DM and a personal history of gestational diabetes. We discussed that she should follow up with PCP for DM screening as elevated blood glucose could be contributing to frequent urination.     Urinary frequency -     POCT urinalysis dipstick  SUI (stress urinary incontinence, female) Assessment & Plan: - For treatment of stress urinary incontinence,  non-surgical options include expectant management, weight loss, physical therapy, as well as a pessary.  Surgical options include a midurethral sling, Burch urethropexy, and transurethral injection of a bulking agent. - She does not feel symptoms are bothersome enough for further treatment at this time. She will continue to do pelvic floor exercises that she learned in PT at home.    Return as needed   Rosaline LOISE Caper, MD

## 2023-10-10 ENCOUNTER — Encounter: Payer: Self-pay | Admitting: Family Medicine

## 2023-10-18 ENCOUNTER — Ambulatory Visit (INDEPENDENT_AMBULATORY_CARE_PROVIDER_SITE_OTHER): Payer: BC Managed Care – PPO | Admitting: Family Medicine

## 2023-10-18 VITALS — BP 135/90 | HR 80 | Ht 68.0 in | Wt 238.8 lb

## 2023-10-18 DIAGNOSIS — I10 Essential (primary) hypertension: Secondary | ICD-10-CM

## 2023-10-18 MED ORDER — VALSARTAN 80 MG PO TABS: 80.0000 mg | ORAL_TABLET | Freq: Every day | ORAL | 0 refills | Status: DC

## 2023-10-18 NOTE — Patient Instructions (Signed)
Unfortunately, your blood pressure is high enough that I do think we need to start a low-dose of blood pressure medicine.  I have sent in a very well-tolerated medicine called valsartan to the pharmacy for you to start taking once a day in the morning we will continue to monitor your blood pressure, our goal is to keep the top number under 140 and the bottom number under 90 on a consistent basis.

## 2023-10-18 NOTE — Addendum Note (Signed)
Addended by: Saralyn Pilar on: 10/18/2023 09:27 AM   Modules accepted: Orders

## 2023-10-18 NOTE — Progress Notes (Signed)
Pt in office for BP check, both checks were elevated and this was reported to PCP who has sent in medication for this.

## 2023-10-20 ENCOUNTER — Encounter: Payer: Self-pay | Admitting: Family Medicine

## 2023-10-20 ENCOUNTER — Ambulatory Visit: Payer: Self-pay | Admitting: Family Medicine

## 2023-10-20 ENCOUNTER — Other Ambulatory Visit: Payer: Self-pay | Admitting: Family Medicine

## 2023-10-20 DIAGNOSIS — I10 Essential (primary) hypertension: Secondary | ICD-10-CM

## 2023-10-20 MED ORDER — AMLODIPINE BESYLATE 5 MG PO TABS: 5.0000 mg | ORAL_TABLET | Freq: Every day | ORAL | 3 refills | Status: DC

## 2023-10-20 MED ORDER — HYDROCHLOROTHIAZIDE 12.5 MG PO TABS
12.5000 mg | ORAL_TABLET | Freq: Every day | ORAL | 3 refills | Status: DC
Start: 2023-10-20 — End: 2023-10-20

## 2023-10-20 NOTE — Telephone Encounter (Signed)
No Answer/Busy - call rang long time then stated: call unable to be completed at this time: please try call again latert

## 2023-10-20 NOTE — Telephone Encounter (Signed)
 Called pt she is advised of her Rx that was sent to the pharmacy

## 2023-10-20 NOTE — Telephone Encounter (Signed)
Copied from CRM [id number]. Topic: Clinical - Medical Advice >> Oct 20, 2023  2:14 PM Kimberly Armstrong wrote: Reason for CRM: Patient called in states she was sent new prescription for blood pressure. She went to get from pharmacy and read over insert and insert states should not take if allergic to sulfur products and patient is so she can not take the new medication she was prescribed. Thank You

## 2023-11-01 ENCOUNTER — Telehealth: Admitting: Physician Assistant

## 2023-11-01 DIAGNOSIS — J069 Acute upper respiratory infection, unspecified: Secondary | ICD-10-CM | POA: Diagnosis not present

## 2023-11-01 DIAGNOSIS — B9689 Other specified bacterial agents as the cause of diseases classified elsewhere: Secondary | ICD-10-CM | POA: Diagnosis not present

## 2023-11-01 MED ORDER — AMOXICILLIN-POT CLAVULANATE 875-125 MG PO TABS: 1.0000 | ORAL_TABLET | Freq: Two times a day (BID) | ORAL | 0 refills | Status: DC

## 2023-11-01 NOTE — Progress Notes (Signed)

## 2023-12-02 ENCOUNTER — Encounter

## 2024-01-26 ENCOUNTER — Telehealth: Payer: Self-pay | Admitting: *Deleted

## 2024-01-26 NOTE — Telephone Encounter (Signed)
 LVM for pt to call office, wanted to see about pushing out her labs until I could get her in with the new provider Meryl Acosta.  I would like to cancel her labs for tomorrow and then schedule her appt with provider and put labs the week before.

## 2024-01-27 ENCOUNTER — Other Ambulatory Visit: Payer: BC Managed Care – PPO

## 2024-01-28 ENCOUNTER — Other Ambulatory Visit: Payer: Self-pay | Admitting: Family Medicine

## 2024-01-28 DIAGNOSIS — J4540 Moderate persistent asthma, uncomplicated: Secondary | ICD-10-CM

## 2024-01-28 DIAGNOSIS — I10 Essential (primary) hypertension: Secondary | ICD-10-CM

## 2024-01-28 DIAGNOSIS — J4520 Mild intermittent asthma, uncomplicated: Secondary | ICD-10-CM

## 2024-01-30 ENCOUNTER — Other Ambulatory Visit: Payer: Self-pay | Admitting: Family Medicine

## 2024-01-30 DIAGNOSIS — J4540 Moderate persistent asthma, uncomplicated: Secondary | ICD-10-CM

## 2024-02-03 ENCOUNTER — Encounter: Payer: BC Managed Care – PPO | Admitting: Family Medicine

## 2024-02-17 ENCOUNTER — Other Ambulatory Visit: Payer: Self-pay | Admitting: *Deleted

## 2024-02-17 DIAGNOSIS — Z6833 Body mass index (BMI) 33.0-33.9, adult: Secondary | ICD-10-CM

## 2024-02-17 DIAGNOSIS — I10 Essential (primary) hypertension: Secondary | ICD-10-CM

## 2024-02-17 DIAGNOSIS — E559 Vitamin D deficiency, unspecified: Secondary | ICD-10-CM

## 2024-02-17 DIAGNOSIS — R7301 Impaired fasting glucose: Secondary | ICD-10-CM

## 2024-02-24 ENCOUNTER — Other Ambulatory Visit

## 2024-02-24 DIAGNOSIS — E559 Vitamin D deficiency, unspecified: Secondary | ICD-10-CM

## 2024-02-24 DIAGNOSIS — Z6833 Body mass index (BMI) 33.0-33.9, adult: Secondary | ICD-10-CM

## 2024-02-24 DIAGNOSIS — R7301 Impaired fasting glucose: Secondary | ICD-10-CM

## 2024-02-24 DIAGNOSIS — I10 Essential (primary) hypertension: Secondary | ICD-10-CM

## 2024-03-01 ENCOUNTER — Other Ambulatory Visit

## 2024-03-01 DIAGNOSIS — I10 Essential (primary) hypertension: Secondary | ICD-10-CM | POA: Diagnosis not present

## 2024-03-01 DIAGNOSIS — R7301 Impaired fasting glucose: Secondary | ICD-10-CM | POA: Diagnosis not present

## 2024-03-01 DIAGNOSIS — E559 Vitamin D deficiency, unspecified: Secondary | ICD-10-CM | POA: Diagnosis not present

## 2024-03-01 DIAGNOSIS — Z6833 Body mass index (BMI) 33.0-33.9, adult: Secondary | ICD-10-CM | POA: Diagnosis not present

## 2024-03-02 LAB — CBC WITH DIFFERENTIAL/PLATELET
Basophils Absolute: 0.1 x10E3/uL (ref 0.0–0.2)
Basos: 1 %
EOS (ABSOLUTE): 0.2 x10E3/uL (ref 0.0–0.4)
Eos: 3 %
Hematocrit: 48.5 % — ABNORMAL HIGH (ref 34.0–46.6)
Hemoglobin: 16.2 g/dL — ABNORMAL HIGH (ref 11.1–15.9)
Immature Grans (Abs): 0 x10E3/uL (ref 0.0–0.1)
Immature Granulocytes: 0 %
Lymphocytes Absolute: 2.3 x10E3/uL (ref 0.7–3.1)
Lymphs: 30 %
MCH: 31.5 pg (ref 26.6–33.0)
MCHC: 33.4 g/dL (ref 31.5–35.7)
MCV: 94 fL (ref 79–97)
Monocytes Absolute: 0.6 x10E3/uL (ref 0.1–0.9)
Monocytes: 7 %
Neutrophils Absolute: 4.6 x10E3/uL (ref 1.4–7.0)
Neutrophils: 59 %
Platelets: 211 x10E3/uL (ref 150–450)
RBC: 5.14 x10E6/uL (ref 3.77–5.28)
RDW: 12.1 % (ref 11.7–15.4)
WBC: 7.7 x10E3/uL (ref 3.4–10.8)

## 2024-03-02 LAB — COMPREHENSIVE METABOLIC PANEL WITH GFR
ALT: 18 IU/L (ref 0–32)
AST: 13 IU/L (ref 0–40)
Albumin: 4.4 g/dL (ref 3.9–4.9)
Alkaline Phosphatase: 80 IU/L (ref 44–121)
BUN/Creatinine Ratio: 8 — ABNORMAL LOW (ref 9–23)
BUN: 7 mg/dL (ref 6–24)
Bilirubin Total: 0.4 mg/dL (ref 0.0–1.2)
CO2: 21 mmol/L (ref 20–29)
Calcium: 9 mg/dL (ref 8.7–10.2)
Chloride: 103 mmol/L (ref 96–106)
Creatinine, Ser: 0.86 mg/dL (ref 0.57–1.00)
Globulin, Total: 2.2 g/dL (ref 1.5–4.5)
Glucose: 84 mg/dL (ref 70–99)
Potassium: 4.1 mmol/L (ref 3.5–5.2)
Sodium: 138 mmol/L (ref 134–144)
Total Protein: 6.6 g/dL (ref 6.0–8.5)
eGFR: 85 mL/min/1.73 (ref >59)

## 2024-03-02 LAB — HEMOGLOBIN A1C
Est. average glucose Bld gHb Est-mCnc: 117 mg/dL
Hgb A1c MFr Bld: 5.7 % — ABNORMAL HIGH (ref 4.8–5.6)

## 2024-03-02 LAB — LIPID PANEL
Chol/HDL Ratio: 5.4 ratio — ABNORMAL HIGH (ref 0.0–4.4)
Cholesterol, Total: 162 mg/dL (ref 100–199)
HDL: 30 mg/dL — ABNORMAL LOW (ref >39)
LDL Chol Calc (NIH): 86 mg/dL (ref 0–99)
Triglycerides: 273 mg/dL — ABNORMAL HIGH (ref 0–149)
VLDL Cholesterol Cal: 46 mg/dL — ABNORMAL HIGH (ref 5–40)

## 2024-03-02 LAB — VITAMIN D 25 HYDROXY (VIT D DEFICIENCY, FRACTURES): Vit D, 25-Hydroxy: 25.2 ng/mL — ABNORMAL LOW (ref 30.0–100.0)

## 2024-03-02 LAB — TSH: TSH: 2.01 u[IU]/mL (ref 0.450–4.500)

## 2024-03-05 ENCOUNTER — Ambulatory Visit: Payer: Self-pay

## 2024-03-07 ENCOUNTER — Ambulatory Visit (INDEPENDENT_AMBULATORY_CARE_PROVIDER_SITE_OTHER)

## 2024-03-07 VITALS — BP 122/73 | HR 78 | Temp 97.9°F | Ht 68.0 in | Wt 235.1 lb

## 2024-03-07 DIAGNOSIS — J4540 Moderate persistent asthma, uncomplicated: Secondary | ICD-10-CM

## 2024-03-07 DIAGNOSIS — E785 Hyperlipidemia, unspecified: Secondary | ICD-10-CM | POA: Insufficient documentation

## 2024-03-07 DIAGNOSIS — R102 Pelvic and perineal pain: Secondary | ICD-10-CM

## 2024-03-07 DIAGNOSIS — R030 Elevated blood-pressure reading, without diagnosis of hypertension: Secondary | ICD-10-CM | POA: Diagnosis not present

## 2024-03-07 DIAGNOSIS — E559 Vitamin D deficiency, unspecified: Secondary | ICD-10-CM

## 2024-03-07 DIAGNOSIS — M545 Low back pain, unspecified: Secondary | ICD-10-CM

## 2024-03-07 DIAGNOSIS — F17209 Nicotine dependence, unspecified, with unspecified nicotine-induced disorders: Secondary | ICD-10-CM

## 2024-03-07 DIAGNOSIS — J302 Other seasonal allergic rhinitis: Secondary | ICD-10-CM

## 2024-03-07 DIAGNOSIS — Z6833 Body mass index (BMI) 33.0-33.9, adult: Secondary | ICD-10-CM

## 2024-03-07 MED ORDER — PHENTERMINE HCL 15 MG PO CAPS
15.0000 mg | ORAL_CAPSULE | ORAL | 2 refills | Status: DC
Start: 2024-03-07 — End: 2024-06-08

## 2024-03-07 MED ORDER — MONTELUKAST SODIUM 10 MG PO TABS: 10.0000 mg | ORAL_TABLET | Freq: Every day | ORAL | 3 refills | Status: AC

## 2024-03-07 MED ORDER — CYCLOBENZAPRINE HCL 10 MG PO TABS: ORAL_TABLET | ORAL | 0 refills | Status: DC

## 2024-03-07 MED ORDER — LORATADINE 10 MG PO TABS
10.0000 mg | ORAL_TABLET | Freq: Every day | ORAL | 3 refills | Status: DC
Start: 2024-03-07 — End: 2024-05-14

## 2024-03-07 NOTE — Assessment & Plan Note (Signed)
 Patient has reduced her smoking from 1 pack/day to half a pack per day.  Her goal is to continue decreasing the number of cigarettes she smokes daily.  Patient is aware that it is not good for her health and knows that she should stop completely.  She would still like to try quitting on her own before trying prescription medications or nicotine replacement.

## 2024-03-07 NOTE — Assessment & Plan Note (Signed)
 Now well-controlled with Advair fluticasone /salmeterol 100-50 mcg/ACT inhaler for maintenance, Airsupra  albuterol -budesonide  90-80 mcg/ACT for rescue inhaler.  Will continue to monitor.

## 2024-03-07 NOTE — Assessment & Plan Note (Signed)
 Last lipid panel: LDL 86, HDL 30, triglycerides 273.  Discussed with patient our options of starting statin today versus changing diet/losing weight/exercising more often to reduce her triglyceride levels.  Patient opted to make changes to her diet to include avoiding foods high in sugar/carb/saturated fats and begin exercising more.  Also suspect that with anticipated weight loss from phentermine , lipid panel will improve as well.  Will plan to recheck lipid panel in 6 months and initiate statin therapy if triglycerides still remain elevated.

## 2024-03-07 NOTE — Progress Notes (Signed)
 Complete physical exam  Patient: Kimberly Armstrong   DOB: 1980/09/18   43 y.o. Female  MRN: [id number]  Subjective:    No chief complaint on file.   Kimberly Armstrong is a 43 y.o. female who presents today for a complete physical exam. She reports consuming a general diet. She does report that she feels she has been overeating and craving sugar more recently. Reports that if she does not satisfy her craving, she will end up binging at night time. Feels as if she cannot turn her hunger off, which she believes is a big obstacle for her in losing weight. Has never taken anything prescribed for weight loss/appetite suppression before, but is interested in starting something. She exercises at tolerated but admits that she needs to increase her amount of exercise weekly. She generally feels well. She reports sleeping well. She reports she has decreased her smoking from 1 ppd to 0.5 ppd. Her goal is to continue working to decrease her smoking slowly. She does have additional problems to discuss today.   Patient has a history of chronic low back pain treated with surgery over ten years ago. Patient reports that ever since this surgery, her back will flare up with muscle spasms periodically after strenuous activity. Patient reports that she is currently going through a flare and has been treating with supportive care (tylenol , iburpofen, lidocaine  patches, heating pad, ice, lotions, etc.). Reports the pain is getting better. Pt has a previous prescription for flexeril  that she used as needed for associated muscle spasms. Denies numbness, tingling, radiation, loss of bowel or bladder function.   Most recent fall risk assessment:    03/07/2024    1:56 PM  Fall Risk   Falls in the past year? 0  Risk for fall due to : No Fall Risks  Follow up Falls evaluation completed     Most recent depression screenings:    03/07/2024    1:57 PM 10/04/2023    8:32 AM  PHQ 2/9 Scores  PHQ - 2 Score 0  0  PHQ- 9 Score 3 4        Patient Care Team: Gayle Saddie JULIANNA DEVONNA as PCP - General (Physician Assistant) Porter Andrez SAUNDERS, PA-C as Physician Assistant (Dermatology) Ginette Shasta NOVAK, NP as Nurse Practitioner (Radiology)   Outpatient Medications Prior to Visit  Medication Sig   ADVAIR DISKUS 100-50 MCG/ACT AEPB INHALE 1 PUFF INTO THE LUNGS 2 TIMES DAILY.   Albuterol -Budesonide  (AIRSUPRA ) 90-80 MCG/ACT AERO Inhale 2 puffs into the lungs every 6 (six) hours as needed. RESCUE INHALER   amLODipine  (NORVASC ) 5 MG tablet Take 1 tablet (5 mg total) by mouth daily.   fluticasone  (FLONASE ) 50 MCG/ACT nasal spray SPRAY 2 SPRAYS INTO EACH NOSTRIL EVERY DAY   VENTOLIN  HFA 108 (90 Base) MCG/ACT inhaler INHALE 2 PUFFS EVERY 6 HOURS AS NEEDED FOR WHEEZE OR SHORTNESS OF BREATH   [DISCONTINUED] cyclobenzaprine  (FLEXERIL ) 10 MG tablet TAKE 1/2 TO 1 TABLET BY MOUTH DAILY AT BEDTIME AS NEEDED FOR MUSCLE PAIN   [DISCONTINUED] loratadine  (CLARITIN ) 10 MG tablet Take 1 tablet (10 mg total) by mouth daily.   [DISCONTINUED] montelukast  (SINGULAIR ) 10 MG tablet TAKE 1 TABLET BY MOUTH EVERYDAY AT BEDTIME   [DISCONTINUED] oxybutynin  (DITROPAN -XL) 10 MG 24 hr tablet Take 1 tablet (10 mg total) by mouth at bedtime.   [DISCONTINUED] senna-docusate (SENOKOT-S) 8.6-50 MG tablet Take 1 tablet by mouth at bedtime as needed for mild constipation or moderate constipation.   [DISCONTINUED] amoxicillin -clavulanate (  AUGMENTIN ) 875-125 MG tablet Take 1 tablet by mouth 2 (two) times daily.   [DISCONTINUED] valsartan  (DIOVAN ) 80 MG tablet TAKE 1 TABLET (80 MG TOTAL) BY MOUTH DAILY.   No facility-administered medications prior to visit.    ROS  Per HPI      Objective:     BP 122/73   Pulse 78   Temp 97.9 F (36.6 C) (Oral)   Ht 5' 8 (1.727 m)   Wt 235 lb 1.3 oz (106.6 kg)   SpO2 98%   BMI 35.74 kg/m    Physical Exam Constitutional:      General: She is not in acute distress.    Appearance: Normal  appearance.  HENT:     Right Ear: Tympanic membrane normal.     Left Ear: Tympanic membrane normal.     Mouth/Throat:     Mouth: Mucous membranes are moist.     Pharynx: Oropharynx is clear.  Eyes:     Pupils: Pupils are equal, round, and reactive to light.  Cardiovascular:     Rate and Rhythm: Normal rate and regular rhythm.     Heart sounds: Normal heart sounds. No murmur heard.    No friction rub. No gallop.  Pulmonary:     Effort: Pulmonary effort is normal. No respiratory distress.     Breath sounds: Normal breath sounds.  Abdominal:     General: Abdomen is flat. Bowel sounds are normal.     Palpations: Abdomen is soft.  Musculoskeletal:        General: No swelling.     Cervical back: Neck supple.  Lymphadenopathy:     Cervical: No cervical adenopathy.  Skin:    General: Skin is warm and dry.  Neurological:     General: No focal deficit present.     Mental Status: She is alert.  Psychiatric:        Mood and Affect: Mood normal.        Behavior: Behavior normal.        Thought Content: Thought content normal.     No results found for any visits on 03/07/24. Last CBC Lab Results  Component Value Date   WBC 7.7 03/01/2024   HGB 16.2 (H) 03/01/2024   HCT 48.5 (H) 03/01/2024   MCV 94 03/01/2024   MCH 31.5 03/01/2024   RDW 12.1 03/01/2024   PLT 211 03/01/2024   Last metabolic panel Lab Results  Component Value Date   GLUCOSE 84 03/01/2024   NA 138 03/01/2024   K 4.1 03/01/2024   CL 103 03/01/2024   CO2 21 03/01/2024   BUN 7 03/01/2024   CREATININE 0.86 03/01/2024   EGFR 85 03/01/2024   CALCIUM 9.0 03/01/2024   PROT 6.6 03/01/2024   ALBUMIN 4.4 03/01/2024   LABGLOB 2.2 03/01/2024   AGRATIO 1.7 11/03/2022   BILITOT 0.4 03/01/2024   ALKPHOS 80 03/01/2024   AST 13 03/01/2024   ALT 18 03/01/2024   ANIONGAP 8 07/12/2023   Last lipids Lab Results  Component Value Date   CHOL 162 03/01/2024   HDL 30 (L) 03/01/2024   LDLCALC 86 03/01/2024   TRIG 273  (H) 03/01/2024   CHOLHDL 5.4 (H) 03/01/2024   Last hemoglobin A1c Lab Results  Component Value Date   HGBA1C 5.7 (H) 03/01/2024   Last thyroid  functions Lab Results  Component Value Date   TSH 2.010 03/01/2024        Assessment & Plan:    Routine Health Maintenance and Physical  Exam  Health Maintenance  Topic Date Due   COVID-19 Vaccine (1) Never done   HIV Screening  Never done   Hepatitis C Screening  Never done   Pneumococcal Vaccination (1 of 2 - PCV) Never done   Hepatitis B Vaccine (1 of 3 - 19+ 3-dose series) Never done   HPV Vaccine (1 - Risk 3-dose SCDM series) Never done   Flu Shot  03/30/2024   Pap with HPV screening  10/16/2027   DTaP/Tdap/Td vaccine (2 - Td or Tdap) 03/06/2031   Meningitis B Vaccine  Aged Out    Discussed health benefits of physical activity, and encouraged her to engage in regular exercise appropriate for her age and condition.  Went over all lab results in detail with the patient.  Addressed chronic issues as well as acute concerns.  Addressed all patient questions.  Went over all age-appropriate health screenings.  Patient verbalized understanding and was in agreement with the following plans.  Body mass index (BMI) of 33.0-33.9 in adult Assessment & Plan: Start phentermine  15 mg once daily to augment weight loss.  Advised patient to notify if this medication causes chest pain or palpitations.  Also advised her to take this medication early in the mornings to prevent insomnia.  Will follow-up on weight loss in 3 months.  SW: 235 CW: 235  Diet: counseled her to focus on high protein, high fiber diet. Gave her a protein goal to hit daily as well as recommendations for fiber supplements.  Exercise: Encouraged her to increase her weekly amount of exercise to 150 min/week.  Medication: Phentermine  15 mg    Pelvic pain in female -     Cyclobenzaprine  HCl; TAKE 1/2 TO 1 TABLET BY MOUTH DAILY AT BEDTIME AS NEEDED FOR MUSCLE PAIN  Dispense: 30  tablet; Refill: 0  Seasonal allergies Assessment & Plan: Stable and now well-controlled.  Continue loratadine  10 mg daily in combination with Singulair  10 mg daily.  Inhalers as needed.  Will continue to monitor.  Orders: -     Loratadine ; Take 1 tablet (10 mg total) by mouth daily.  Dispense: 90 tablet; Refill: 3 -     Montelukast  Sodium; Take 1 tablet (10 mg total) by mouth at bedtime.  Dispense: 90 tablet; Refill: 3  Elevated blood pressure reading without diagnosis of hypertension Assessment & Plan: Patient previously prescribed valsartan  for blood pressure control, however read on the prescription that patient is allergic to sulfa  antibiotic should not take this medication.  She was then prescribed amlodipine  5 mg to take daily for blood pressure control.  Reports that she has not been taking this medication.  Blood pressure goal <130/80.  Blood pressure is at goal today in office.  Advised patient to monitor blood pressure at home and notify of readings consistently higher than 130/80.  Will continue monitoring with medication for now, however if readings show to be persistently elevated, will resume amlodipine  5 mg.   Moderate persistent asthma without complication Assessment & Plan: Now well-controlled with Advair fluticasone /salmeterol 100-50 mcg/ACT inhaler for maintenance, Airsupra  albuterol -budesonide  90-80 mcg/ACT for rescue inhaler.  Will continue to monitor.   Tobacco use disorder, continuous Assessment & Plan: Patient has reduced her smoking from 1 pack/day to half a pack per day.  Her goal is to continue decreasing the number of cigarettes she smokes daily.  Patient is aware that it is not good for her health and knows that she should stop completely.  She would still like to try quitting  on her own before trying prescription medications or nicotine replacement.   Vitamin D  deficiency Assessment & Plan: Most recent vitamin D  insufficient range at 25.2.  Recommend daily  vitamin D3 supplement of 2000 units/day.  Will continue to monitor.   Midline low back pain without sciatica, unspecified chronicity Assessment & Plan: Advised patient to continue with supportive care at home to include alternating Tylenol , ibuprofen , lidocaine  patches, heating pads, pain relieving lotions.  Provided a refill on her Flexeril  that she uses as needed for associated muscle spasms.  Reminded patient that this medication can cause drowsiness and to begin taking only at nighttime.  Will continue to monitor.   Hyperlipidemia, unspecified hyperlipidemia type Assessment & Plan: Last lipid panel: LDL 86, HDL 30, triglycerides 273.  Discussed with patient our options of starting statin today versus changing diet/losing weight/exercising more often to reduce her triglyceride levels.  Patient opted to make changes to her diet to include avoiding foods high in sugar/carb/saturated fats and begin exercising more.  Also suspect that with anticipated weight loss from phentermine , lipid panel will improve as well.  Will plan to recheck lipid panel in 6 months and initiate statin therapy if triglycerides still remain elevated.   Other orders -     Phentermine  HCl; Take 1 capsule (15 mg total) by mouth every morning.  Dispense: 30 capsule; Refill: 2    Return in about 3 months (around 06/07/2024) for Weight.     Saddie JULIANNA Sacks, PA-C

## 2024-03-07 NOTE — Assessment & Plan Note (Signed)
 Stable and now well-controlled.  Continue loratadine  10 mg daily in combination with Singulair  10 mg daily.  Inhalers as needed.  Will continue to monitor.

## 2024-03-07 NOTE — Assessment & Plan Note (Signed)
 Start phentermine  15 mg once daily to augment weight loss.  Advised patient to notify if this medication causes chest pain or palpitations.  Also advised her to take this medication early in the mornings to prevent insomnia.  Will follow-up on weight loss in 3 months.  SW: 235 CW: 235  Diet: counseled her to focus on high protein, high fiber diet. Gave her a protein goal to hit daily as well as recommendations for fiber supplements.  Exercise: Encouraged her to increase her weekly amount of exercise to 150 min/week.  Medication: Phentermine  15 mg

## 2024-03-07 NOTE — Assessment & Plan Note (Signed)
 Advised patient to continue with supportive care at home to include alternating Tylenol , ibuprofen , lidocaine  patches, heating pads, pain relieving lotions.  Provided a refill on her Flexeril  that she uses as needed for associated muscle spasms.  Reminded patient that this medication can cause drowsiness and to begin taking only at nighttime.  Will continue to monitor.

## 2024-03-07 NOTE — Assessment & Plan Note (Addendum)
 Patient previously prescribed valsartan  for blood pressure control, however read on the prescription that patient is allergic to sulfa  antibiotic should not take this medication.  She was then prescribed amlodipine  5 mg to take daily for blood pressure control.  Reports that she has not been taking this medication.  Blood pressure goal <130/80.  Blood pressure is at goal today in office.  Advised patient to monitor blood pressure at home and notify of readings consistently higher than 130/80.  Will continue monitoring with medication for now, however if readings show to be persistently elevated, will resume amlodipine  5 mg.

## 2024-03-07 NOTE — Patient Instructions (Addendum)
 It was nice to see you today!  As we discussed in clinic:  -I have sent in phentermine  15 mg to take daily in the mornings to suppress appetite and help with weight loss. Please let me know if you experience chest pain or heart palpitations when starting this medication. Please be sure to take this medication early in the mornings to avoid insomnia at night time.  -While taking this medication, try to focus on protein and fiber intake. I recommend getting anywhere from 80-100 g of protein daily. You can also add an over-the-counter fiber supplement of 5-8 mg daily to help regulate bowel movements and stay fuller longer.  -Please work to limit sugar/carb intake to focus on A1c and avoid fried foods/greasy foods to support cholesterol levels.  - Please continue to monitor blood pressure at home and notify me if your blood pressure is consistently running greater than 130/80.  Since you have been without your blood pressure medication for several days and it looks good in the office today, we can continue without it for now.  If  -I will plan to see you back in 3 months for a follow up on weight! It was nice to meet you!  If you have any problems before your next visit feel free to message me via MyChart (minor issues or questions) or call the office, otherwise you may reach out to schedule an office visit.  Thank you! Saddie Sacks, PA-C

## 2024-03-07 NOTE — Assessment & Plan Note (Signed)
 Most recent vitamin D  insufficient range at 25.2.  Recommend daily vitamin D3 supplement of 2000 units/day.  Will continue to monitor.

## 2024-03-12 ENCOUNTER — Other Ambulatory Visit: Payer: Self-pay

## 2024-03-12 DIAGNOSIS — J4520 Mild intermittent asthma, uncomplicated: Secondary | ICD-10-CM

## 2024-04-09 ENCOUNTER — Other Ambulatory Visit: Payer: Self-pay

## 2024-04-09 DIAGNOSIS — J4520 Mild intermittent asthma, uncomplicated: Secondary | ICD-10-CM

## 2024-05-12 ENCOUNTER — Other Ambulatory Visit: Payer: Self-pay | Admitting: Family Medicine

## 2024-05-12 DIAGNOSIS — J302 Other seasonal allergic rhinitis: Secondary | ICD-10-CM

## 2024-05-31 ENCOUNTER — Telehealth: Admitting: Physician Assistant

## 2024-05-31 DIAGNOSIS — M5442 Lumbago with sciatica, left side: Secondary | ICD-10-CM | POA: Diagnosis not present

## 2024-05-31 MED ORDER — NAPROXEN 500 MG PO TABS: 500.0000 mg | ORAL_TABLET | Freq: Two times a day (BID) | ORAL | 0 refills | Status: DC

## 2024-05-31 MED ORDER — CYCLOBENZAPRINE HCL 10 MG PO TABS: 5.0000 mg | ORAL_TABLET | Freq: Three times a day (TID) | ORAL | 0 refills | Status: AC | PRN

## 2024-05-31 NOTE — Progress Notes (Signed)

## 2024-06-08 ENCOUNTER — Ambulatory Visit (INDEPENDENT_AMBULATORY_CARE_PROVIDER_SITE_OTHER)

## 2024-06-08 VITALS — BP 143/82 | HR 82 | Temp 97.9°F | Ht 68.0 in | Wt 229.0 lb

## 2024-06-08 DIAGNOSIS — Z6833 Body mass index (BMI) 33.0-33.9, adult: Secondary | ICD-10-CM | POA: Diagnosis not present

## 2024-06-08 DIAGNOSIS — Z23 Encounter for immunization: Secondary | ICD-10-CM | POA: Diagnosis not present

## 2024-06-08 DIAGNOSIS — R030 Elevated blood-pressure reading, without diagnosis of hypertension: Secondary | ICD-10-CM | POA: Diagnosis not present

## 2024-06-08 DIAGNOSIS — M5432 Sciatica, left side: Secondary | ICD-10-CM | POA: Diagnosis not present

## 2024-06-08 DIAGNOSIS — L309 Dermatitis, unspecified: Secondary | ICD-10-CM | POA: Diagnosis not present

## 2024-06-08 DIAGNOSIS — M5430 Sciatica, unspecified side: Secondary | ICD-10-CM | POA: Insufficient documentation

## 2024-06-08 MED ORDER — PHENTERMINE HCL 30 MG PO CAPS
30.0000 mg | ORAL_CAPSULE | ORAL | 2 refills | Status: AC
Start: 2024-06-08 — End: ?

## 2024-06-08 MED ORDER — METHYLPREDNISOLONE SODIUM SUCC 125 MG IJ SOLR
125.0000 mg | Freq: Once | INTRAMUSCULAR | Status: AC
  Administered 2024-06-08: 125 mg via INTRAMUSCULAR

## 2024-06-08 MED ORDER — TRIAMCINOLONE ACETONIDE 0.025 % EX CREA: 1.0000 | TOPICAL_CREAM | Freq: Two times a day (BID) | CUTANEOUS | 0 refills | Status: AC

## 2024-06-08 NOTE — Assessment & Plan Note (Signed)
 SW: 235 CW: 229  Change in weight: -6 lbs Diet: counseled her to focus on high protein, high fiber diet. Gave her a protein goal to hit daily as well as recommendations for fiber supplements.  Exercise: Encouraged her to increase her weekly amount of exercise to 150 min/week.  Medication: Phentermine  30 mg  PDMP reviewed, no aberrancies

## 2024-06-08 NOTE — Assessment & Plan Note (Signed)
 Sciatica with pain radiating from buttock to leg, limited relief from naproxen  and muscle relaxers, history of back surgery in 2011. - Administer steroid shot - Solu Medrol  125 mg IM once. - Consider MRI and orthopedic referral if symptoms persist for six weeks.

## 2024-06-08 NOTE — Progress Notes (Signed)
 Established Patient Office Visit  Subjective   Patient ID: Kimberly Armstrong, female    DOB: 1981-07-28  Age: 43 y.o. MRN: [id number]  Chief Complaint  Patient presents with   Medical Management of Chronic Issues    HPI  History of Present Illness   Kimberly Armstrong is a 43 year old female who presents with sciatic nerve pain and weight management concerns.  Radicular leg pain - Sciatic nerve pain for approximately two and a half weeks - Pain originates in the buttock and radiates down the leg - No associated back pain - Pain is worse in the mornings - History of back surgery in 2011 following an injury in 2009 - No similar symptoms since surgery until this recent flare-up - Current management includes naproxen , muscle relaxers, and icing the area for relief  Weight management - Focused on weight management - Weight loss of six pounds over the past three months - Currently taking 15 mg of phentermine  - Incorporating lifestyle changes, including walking  Lower extremity rash - Rash on the shin persisting for about three months - Initially thought to be razor burn, but has not resolved - Intermittent pruritus - Sensation of heat in the area, but no significant burning - Tried Neosporin and antifungal cream without improvement          ROS Per HPI.    Objective:     BP (!) 143/82   Pulse 82   Temp 97.9 F (36.6 C) (Oral)   Ht 5' 8 (1.727 m)   Wt 229 lb (103.9 kg)   SpO2 100%   BMI 34.82 kg/m    Physical Exam Constitutional:      General: She is not in acute distress.    Appearance: Normal appearance.  Cardiovascular:     Rate and Rhythm: Normal rate and regular rhythm.     Heart sounds: Normal heart sounds. No murmur heard.    No friction rub. No gallop.  Pulmonary:     Effort: Pulmonary effort is normal. No respiratory distress.     Breath sounds: Normal breath sounds.  Musculoskeletal:        General: No swelling.     Lumbar  back: Tenderness present. Positive left straight leg raise test.  Skin:    General: Skin is warm and dry.     Comments: Raised, flaking areas of rash noted to extensor surfaces of both lower extremities   Neurological:     General: No focal deficit present.     Mental Status: She is alert.  Psychiatric:        Mood and Affect: Mood normal.        Behavior: Behavior normal.        Thought Content: Thought content normal.      No results found for any visits on 06/08/24.    The 10-year ASCVD risk score (Arnett DK, et al., 2019) is: 8.3%    Assessment & Plan:   Encounter for vaccination -     Flu vaccine trivalent PF, 6mos and older(Flulaval,Afluria,Fluarix,Fluzone)  Body mass index (BMI) of 33.0-33.9 in adult Assessment & Plan: SW: 235 CW: 229  Change in weight: -6 lbs Diet: counseled her to focus on high protein, high fiber diet. Gave her a protein goal to hit daily as well as recommendations for fiber supplements.  Exercise: Encouraged her to increase her weekly amount of exercise to 150 min/week.  Medication: Phentermine  30 mg  PDMP reviewed, no aberrancies  Orders: -  Phentermine  HCl; Take 1 capsule (30 mg total) by mouth every morning.  Dispense: 30 capsule; Refill: 2  Sciatica of left side Assessment & Plan: Sciatica with pain radiating from buttock to leg, limited relief from naproxen  and muscle relaxers, history of back surgery in 2011. - Administer steroid shot - Solu Medrol  125 mg IM once. - Consider MRI and orthopedic referral if symptoms persist for six weeks.  Orders: -     methylPREDNISolone  Sodium Succ  Eczema, unspecified type Assessment & Plan: Eczema on shin, unresponsive to Neosporin and antifungal cream, indicating inflammatory cause. - Prescribe steroid triamcinolone  0.025% cream twice daily for at least one week.   Elevated blood pressure reading without diagnosis of hypertension Assessment & Plan: Blood pressure goal <130/80.  Blood  pressure above goal in office today and on rehceck. Discontinued valsartan  several months ago due to sulfa  allergy. Patient is not currently taking her amlodipine .  Advised patient to monitor blood pressure at home and notify of readings consistently higher than 130/80.  Will add amlodipine  back to her regimen if BP remains elevated at follow up   Other orders -     Triamcinolone  Acetonide; Apply 1 Application topically 2 (two) times daily.  Dispense: 30 g; Refill: 0     Return in about 3 months (around 09/08/2024) for Weight, HTN.    Saddie JULIANNA Sacks, PA-C

## 2024-06-08 NOTE — Assessment & Plan Note (Signed)
 Blood pressure goal <130/80.  Blood pressure above goal in office today and on rehceck. Discontinued valsartan  several months ago due to sulfa  allergy. Patient is not currently taking her amlodipine .  Advised patient to monitor blood pressure at home and notify of readings consistently higher than 130/80.  Will add amlodipine  back to her regimen if BP remains elevated at follow up

## 2024-06-08 NOTE — Patient Instructions (Signed)
 VISIT SUMMARY: During your visit, we addressed your sciatic nerve pain, weight management, lower extremity rash, and hypertension. We also discussed general health maintenance, including a flu vaccination and upcoming blood work.  YOUR PLAN: SCIATICA: You have sciatic nerve pain that starts in your buttock and radiates down your leg. This has been ongoing for about two and a half weeks and is worse in the mornings. -We administered a steroid shot (Kenalog ) to help with the pain. -If your symptoms persist for six weeks, we will consider an MRI and possibly refer you to an orthopedic specialist.  WEIGHT LOSS: You are focused on weight management and have lost six pounds over the past three months. You are currently taking 15 mg of phentermine . -We will increase your phentermine  dose to 30 mg. -Continue with your lifestyle changes, including walking.  HYPERTENSION: Your blood pressure is slightly elevated. -Monitor your blood pressure at home. -We will reassess your blood pressure at your next appointment in January.  ECZEMA OF LOWER LEG: You have a rash on your shin that has not improved with Neosporin or antifungal cream. This is likely due to inflammation. -We prescribed a steroid cream to be applied twice daily for at least one week.  GENERAL HEALTH MAINTENANCE: We discussed general health maintenance, including flu vaccination and blood work. -We administered your flu vaccine today. -Schedule fasting blood work one week before your next appointment in January.  If you have any problems before your next visit feel free to message me via MyChart (minor issues or questions) or call the office, otherwise you may reach out to schedule an office visit.  Thank you! Saddie Sacks, PA-C

## 2024-06-08 NOTE — Assessment & Plan Note (Signed)
 Eczema on shin, unresponsive to Neosporin and antifungal cream, indicating inflammatory cause. - Prescribe steroid triamcinolone  0.025% cream twice daily for at least one week.

## 2024-06-12 ENCOUNTER — Other Ambulatory Visit: Payer: Self-pay

## 2024-06-12 DIAGNOSIS — Z9889 Other specified postprocedural states: Secondary | ICD-10-CM

## 2024-06-12 DIAGNOSIS — M5432 Sciatica, left side: Secondary | ICD-10-CM

## 2024-06-20 ENCOUNTER — Telehealth: Payer: Self-pay

## 2024-06-20 NOTE — Telephone Encounter (Signed)
 Copied from CRM [id number]. Topic: Referral - Question >> Jun 20, 2024  8:19 AM Everette C wrote: Reason for CRM: The patient would like to be contacted by a member of administrative staff to discuss transferring their referral for an MRI to a new facility. The patient would like orders for an MRI Submitted to [facility]  Please contact the patient further if needed

## 2024-06-21 NOTE — Telephone Encounter (Signed)
 The order for her MRI has been changed for [facility] [place].   She will need to call [facility] and cancel the appointment she already has scheduled for 06/27/24 for the first MRI I ordered.

## 2024-06-21 NOTE — Addendum Note (Signed)
 Addended byBETHA GAYLE NUMBERS on: 06/21/2024 09:17 AM   Modules accepted: Orders

## 2024-06-26 ENCOUNTER — Other Ambulatory Visit

## 2024-06-27 ENCOUNTER — Ambulatory Visit
Admission: RE | Admit: 2024-06-27 | Discharge: 2024-06-27 | Disposition: A | Discharge status: Home / Self Care | Source: Ambulatory Visit

## 2024-06-27 DIAGNOSIS — M4726 Other spondylosis with radiculopathy, lumbar region: Secondary | ICD-10-CM | POA: Diagnosis not present

## 2024-06-27 DIAGNOSIS — M4807 Spinal stenosis, lumbosacral region: Secondary | ICD-10-CM | POA: Diagnosis not present

## 2024-06-27 DIAGNOSIS — M549 Dorsalgia, unspecified: Secondary | ICD-10-CM

## 2024-06-27 DIAGNOSIS — M5432 Sciatica, left side: Secondary | ICD-10-CM

## 2024-06-27 DIAGNOSIS — M5116 Intervertebral disc disorders with radiculopathy, lumbar region: Secondary | ICD-10-CM | POA: Diagnosis not present

## 2024-06-28 NOTE — Progress Notes (Unsigned)
 Referring Physician:  Gayle Saddie FALCON, PA-C [address] [place],  [place] [address]  Primary Physician:  Gayle Saddie FALCON, PA-C  History of Present Illness: 07/04/2024 [name] has a history of asthma, eczema, raynaud's, hyperlipidemia.   History of microdiscectomy in 2011.   Now with 3 month history of constant left buttock pain with left lateral calf pain- no pain in thigh or foot. She has numbness/tingling in left leg along with weakness. No right leg pain. Pain is worse in the morning and with walking. Some relief with stretching, ice, an TENs.   She takes prn flexeril .   Tobacco use: smokes 1/2 PPD x 20 years.   Bowel/Bladder Dysfunction: none  Conservative measures:  Physical therapy: has not participated in Multimodal medical therapy including regular antiinflammatories:  Tylenol , Ibuprofen , Naproxen , Flexeril  Injections:  no epidural steroid injections  Past Surgery: In 2009 - Herniated L4/L5 and severely pinched S1; resulting in laparoscopic microdiscectomy in 12/2009 in [place], [place]  by Dr. Meribeth Hallmark.   Tawni Boettcher Quirion has no symptoms of cervical myelopathy.  The symptoms are causing a significant impact on the patient's life.   Review of Systems:  A 10 point review of systems is negative, except for the pertinent positives and negatives detailed in the HPI.  Past Medical History: Past Medical History:  Diagnosis Date   Allergy 2022   Environmental allergies   Anemia    Asthma    TID use currently 2.2024   Cataract 2016   Lt eye surgery 2017, Rt stil need to do   Endometriosis    Tobacco abuse     Past Surgical History: Past Surgical History:  Procedure Laterality Date   ABDOMINAL HYSTERECTOMY  2017   Partial Hysterectomy   BACK SURGERY     carpel tunnel     CESAREAN SECTION     x3   ENDOMETRIAL ABLATION     EYE SURGERY N/A    [facility] 11/30/2020   OTHER SURGICAL HISTORY  2013   removal of scar tissue (endometriosis)  from Pfannenstiel incision   PARTIAL HYSTERECTOMY  2017   cervix, ovaries left in situ   ROBOT ASSISTED TRACHELECTOMY     ROBOT ASSISTED TRACHELECTOMY  03/09/2023   ROBOTIC ASSISTED LAPAROSCOPIC OVARIAN CYSTECTOMY Left 03/09/2023   SPINE SURGERY N/A    [facility] 11/30/2020   TUBAL LIGATION N/A    [facility] 11/30/2020, at time of last section    Allergies: Allergies as of 07/04/2024 - Review Complete 06/08/2024  Allergen Reaction Noted   Sulfa  antibiotics Hives and Rash 05/26/2022    Medications: Outpatient Encounter Medications as of 07/04/2024  Medication Sig   ADVAIR DISKUS 100-50 MCG/ACT AEPB INHALE 1 PUFF INTO THE LUNGS 2 TIMES DAILY.   Albuterol -Budesonide  (AIRSUPRA ) 90-80 MCG/ACT AERO Inhale 2 puffs into the lungs every 6 (six) hours as needed. RESCUE INHALER   amLODipine  (NORVASC ) 5 MG tablet Take 1 tablet (5 mg total) by mouth daily.   cyclobenzaprine  (FLEXERIL ) 10 MG tablet Take 0.5-1 tablets (5-10 mg total) by mouth 3 (three) times daily as needed.   fluticasone  (FLONASE ) 50 MCG/ACT nasal spray SPRAY 2 SPRAYS INTO EACH NOSTRIL EVERY DAY   loratadine  (CLARITIN ) 10 MG tablet TAKE 1 TABLET (10 MG TOTAL) BY MOUTH DAILY.   montelukast  (SINGULAIR ) 10 MG tablet Take 1 tablet (10 mg total) by mouth at bedtime.   phentermine  30 MG capsule Take 1 capsule (30 mg total) by mouth every morning.   triamcinolone  (KENALOG ) 0.025 %  cream Apply 1 Application topically 2 (two) times daily.   VENTOLIN  HFA 108 (90 Base) MCG/ACT inhaler INHALE 2 PUFFS EVERY 6 HOURS AS NEEDED FOR WHEEZE OR SHORTNESS OF BREATH   [DISCONTINUED] naproxen  (NAPROSYN ) 500 MG tablet Take 1 tablet (500 mg total) by mouth 2 (two) times daily with a meal.   No facility-administered encounter medications on file as of 07/04/2024.    Social History: Social History   Tobacco Use   Smoking status: Every Day    Current packs/day: 0.50    Average packs/day: 0.5 packs/day for 26.2 years (13.1 ttl pk-yrs)    Types:  Cigarettes    Start date: 12/03/2000    Passive exposure: Current   Smokeless tobacco: Never  Vaping Use   Vaping status: Never Used  Substance Use Topics   Alcohol use: Yes    Alcohol/week: 2.0 standard drinks of alcohol    Types: 2 Glasses of wine per week    Comment: occasionally   Drug use: Never    Family Medical History: Family History  Problem Relation Age of Onset   Breast cancer Mother    Heart attack Mother    Hypertension Mother    Asthma Father    Diabetes Sister    Asthma Sister    Breast cancer Maternal Grandmother    Dementia Paternal Grandmother    Asthma Sister    Obesity Sister    Colon cancer Neg Hx    Esophageal cancer Neg Hx    Stomach cancer Neg Hx    Colon polyps Neg Hx    Pancreatic cancer Neg Hx     Physical Examination: Vitals:   07/04/24 0955  BP: (!) 150/102    General: Patient is well developed, well nourished, calm, collected, and in no apparent distress. Attention to examination is appropriate.  Respiratory: Patient is breathing without any difficulty.   NEUROLOGICAL:     Awake, alert, oriented to person, place, and time.  Speech is clear and fluent. Fund of knowledge is appropriate.   Cranial Nerves: Pupils equal round and reactive to light.  Facial tone is symmetric.    No posterior lumbar tenderness. Well healed lumbar incision.   No abnormal lesions on exposed skin.   Strength: Side Biceps Triceps Deltoid Interossei Grip Wrist Ext. Wrist Flex.  R 5 5 5 5 5 5 5   L 5 5 5 5 5 5 5    Side Iliopsoas Quads Hamstring PF DF EHL  R 5 5 5 5 5 5   L 5 5 5 5 5 5    Reflexes are 2+ and symmetric at the biceps, brachioradialis, patella and achilles.   Hoffman's is absent.  Clonus is not present.   Bilateral upper and lower extremity sensation is intact to light touch.     She can heel/toe stand on right and left leg.   No pain with IR/ER of both hips.   Gait is normal.     Medical Decision Making  Imaging: MRI lumbar spine  dated 06/27/24:  FINDINGS:   BONES AND ALIGNMENT: Normal alignment. Normal vertebral body heights. Bone marrow signal is unremarkable. No bone lesions or fractures.   SPINAL CORD: The conus medullaris terminates at L1.   SOFT TISSUES: No significant paraspinal or retroperitoneal findings. Simple 2.5 cm right renal cyst not requiring any further imaging evaluation or follow-up.   T12-L1: No significant findings.   L1-L2: No significant findings.   L2-L3: Disc desiccation and mild disc space narrowing with slight bulging annulus. Mild  bilateral lateral recess stenosis but no spinal or foraminal stenosis.   L3-L4: No significant findings.   L4-L5: Mild-to-moderate facet disease and slight bulging annulus with flattening of the ventral thecal sac and early spinal stenosis and lateral recess stenosis but no foraminal stenosis. Left-sided synovial cyst measuring 14 mm projects posteriorly.   L5-S1: Moderate to large left paracentral disc protrusion with significant mass effect on the left side of the thecal sac with mass effect and displacement of the left S1 nerve root likely causing the patient's left radicular symptoms. The exiting L5 nerve roots appear normal. Moderate facet disease.   IMPRESSION: 1. 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 2. Mild-to-moderate facet arthropathy and slight bulging annulus at L4-5 with early spinal and lateral recess stenosis without foraminal stenosis. 3. Left L4-5 posteriorly projecting synovial cyst measuring 14 mm.   Electronically signed by: Maude Stammer MD 06/30/2024 11:21 AM EDT RP Workstation: [id number]    I have personally reviewed the images and agree with the above interpretation.  Assessment and Plan: Ms. Varma has a history of microdiscectomy in 2011.  She did well after surgery.   Now with 3 month history of constant left buttock pain with left lateral calf pain- no pain in thigh or foot. She has numbness/tingling in left leg along with weakness. No right leg pain.  She has known moderate to large left paracentral disc protrusion at L5-S1 with significant displacement of the left S1 nerve root.   Treatment options discussed with patient and following plan made:   - PT orders to [facility] in [place].  - Referral to PMR at [facility] to discuss possible lumbar injections.  - She would like to avoid surgery if possible.  - Follow up with me in 6 weeks and prn.  - Reviewed red flag symptoms.   BP was elevated. No symptoms of chest pain, shortness of breath, blurry vision, or headaches. She will recheck at home and call PCP if not improved. If she develops CP, SOB, blurry vision, or headaches, then she will go to ED.     I spent a total of 35 minutes in face-to-face and non-face-to-face activities related to this patient's care today including review of outside records, review of imaging, review of symptoms, physical exam, discussion of differential diagnosis, discussion of treatment options, and documentation.   Thank you for involving me in the care of this patient.   Glade Boys PA-C Dept. of Neurosurgery

## 2024-07-01 ENCOUNTER — Ambulatory Visit: Payer: Self-pay

## 2024-07-04 ENCOUNTER — Encounter: Payer: Self-pay | Admitting: Orthopedic Surgery

## 2024-07-04 ENCOUNTER — Ambulatory Visit: Admitting: Orthopedic Surgery

## 2024-07-04 VITALS — BP 148/98 | Ht 69.5 in | Wt 226.1 lb

## 2024-07-04 DIAGNOSIS — M4726 Other spondylosis with radiculopathy, lumbar region: Secondary | ICD-10-CM

## 2024-07-04 DIAGNOSIS — M5126 Other intervertebral disc displacement, lumbar region: Secondary | ICD-10-CM

## 2024-07-04 DIAGNOSIS — M5127 Other intervertebral disc displacement, lumbosacral region: Secondary | ICD-10-CM | POA: Diagnosis not present

## 2024-07-04 DIAGNOSIS — M5416 Radiculopathy, lumbar region: Secondary | ICD-10-CM

## 2024-07-04 DIAGNOSIS — Z9889 Other specified postprocedural states: Secondary | ICD-10-CM

## 2024-07-04 DIAGNOSIS — M47816 Spondylosis without myelopathy or radiculopathy, lumbar region: Secondary | ICD-10-CM

## 2024-07-04 NOTE — Patient Instructions (Signed)
 It was so nice to see you today. Thank you so much for coming in.    You have a herniated disc at L5-S1 and I think this is causing your pain.   I sent physical therapy orders to [facility] in [place]. You can call them at [phone] if you don't hear from them to schedule your visit.   I want you to see physical medicine and rehab at the [facility] to discuss possible injections in your lower back. Dr. Avanell, Dr. Dodson, and their NP [place] are great and will take good care of you. They should call you to schedule an appointment or you can call them at [phone].   Your blood pressure was elevated today. I want you to recheck it at home and follow up with your PCP if it remains high. If you have any chest pain, shortness of breath, blurry vision, or headaches then you need to go to ED.    I will see you back in 6 weeks. Please do not hesitate to call if you have any questions or concerns. You can also message me in MyChart.   Glade Boys PA-C [phone]     The physicians and staff at [facility] at [facility] are committed to providing excellent care. You may receive a survey asking for feedback about your experience at our office. We value you your feedback and appreciate you taking the time to to fill it out. The [facility] leadership team is also available to discuss your experience in person, feel free to contact us  [phone].

## 2024-07-11 DIAGNOSIS — M545 Low back pain, unspecified: Secondary | ICD-10-CM | POA: Diagnosis not present

## 2024-07-20 DIAGNOSIS — M545 Low back pain, unspecified: Secondary | ICD-10-CM | POA: Diagnosis not present

## 2024-08-06 DIAGNOSIS — M5416 Radiculopathy, lumbar region: Secondary | ICD-10-CM | POA: Diagnosis not present

## 2024-08-06 DIAGNOSIS — M5126 Other intervertebral disc displacement, lumbar region: Secondary | ICD-10-CM | POA: Diagnosis not present

## 2024-08-10 DIAGNOSIS — M545 Low back pain, unspecified: Secondary | ICD-10-CM | POA: Diagnosis not present

## 2024-08-10 NOTE — Progress Notes (Signed)
 Referring Physician:  Gayle Saddie FALCON, PA-C [address] [place],  [place] [address]  Primary Physician:  Gayle Saddie FALCON, PA-C  History of Present Illness: [name] has a history of asthma, eczema, raynaud's, hyperlipidemia.   History of microdiscectomy in 2011.   Last seen by me on 07/04/24 for left buttock and left calf pain- no pain in thigh or foot. She has known moderate to large left paracentral disc protrusion at L5-S1 with significant displacement of the left S1 nerve root.   She was sent to PT and PMR. PMR did not recommend ESI as her pain was tolerable.   She has been in PT at [facility]. Initial evaluation was 07/11/24 and she did 3 sessions. Is doing HEP.   She is here for follow up.   Her pain is much improved! She has minimal intermittent left buttock pain that is a 1-2 on a scale of 1-10. She has no significant left leg pain, numbness, or weakness. No weakness.   Tobacco use: smokes 1/2 PPD x 20 years.   Bowel/Bladder Dysfunction: none  Conservative measures:  Physical therapy: [facility] initial evaluation 07/11/24 and did 3 visits. Now doing HEP.  Multimodal medical therapy including regular antiinflammatories:  Tylenol , Ibuprofen , Naproxen , Flexeril  Injections:  no epidural steroid injections  Past Surgery: In 2009 - Herniated L4/L5 and severely pinched S1; resulting in laparoscopic microdiscectomy in 12/2009 in [place], [place]  by Dr. Meribeth Hallmark.   [name] has no symptoms of cervical myelopathy.  The symptoms are causing a significant impact on the patient's life.   Review of Systems:  A 10 point review of systems is negative, except for the pertinent positives and negatives detailed in the HPI.  Past Medical History: Past Medical History:  Diagnosis Date   Allergy 2022   Environmental allergies   Anemia    Asthma    TID use currently 2.2024   Cataract 2016   Lt eye surgery 2017, Rt stil need to do   Endometriosis     Tobacco abuse     Past Surgical History: Past Surgical History:  Procedure Laterality Date   ABDOMINAL HYSTERECTOMY  2017   Partial Hysterectomy   BACK SURGERY     carpel tunnel     CESAREAN SECTION     x3   ENDOMETRIAL ABLATION     EYE SURGERY N/A    [facility] 11/30/2020   OTHER SURGICAL HISTORY  2013   removal of scar tissue (endometriosis) from Pfannenstiel incision   PARTIAL HYSTERECTOMY  2017   cervix, ovaries left in situ   ROBOT ASSISTED TRACHELECTOMY     ROBOT ASSISTED TRACHELECTOMY  03/09/2023   ROBOTIC ASSISTED LAPAROSCOPIC OVARIAN CYSTECTOMY Left 03/09/2023   SPINE SURGERY N/A    [facility] 11/30/2020   TUBAL LIGATION N/A    [facility] 11/30/2020, at time of last section    Allergies: Allergies as of 08/14/2024 - Review Complete 08/14/2024  Allergen Reaction Noted   Sulfa  antibiotics Hives and Rash 05/26/2022    Medications: Outpatient Encounter Medications as of 08/14/2024  Medication Sig   ADVAIR DISKUS 100-50 MCG/ACT AEPB INHALE 1 PUFF INTO THE LUNGS 2 TIMES DAILY.   Albuterol -Budesonide  (AIRSUPRA ) 90-80 MCG/ACT AERO Inhale 2 puffs into the lungs every 6 (six) hours as needed. RESCUE INHALER   cyclobenzaprine  (FLEXERIL ) 10 MG tablet Take 0.5-1 tablets (5-10 mg total) by mouth 3 (three) times daily as needed.   fluticasone  (FLONASE ) 50 MCG/ACT nasal spray SPRAY 2 SPRAYS INTO  EACH NOSTRIL EVERY DAY   loratadine  (CLARITIN ) 10 MG tablet TAKE 1 TABLET (10 MG TOTAL) BY MOUTH DAILY.   montelukast  (SINGULAIR ) 10 MG tablet Take 1 tablet (10 mg total) by mouth at bedtime.   phentermine  30 MG capsule Take 1 capsule (30 mg total) by mouth every morning.   triamcinolone  (KENALOG ) 0.025 % cream Apply 1 Application topically 2 (two) times daily.   VENTOLIN  HFA 108 (90 Base) MCG/ACT inhaler INHALE 2 PUFFS EVERY 6 HOURS AS NEEDED FOR WHEEZE OR SHORTNESS OF BREATH   [DISCONTINUED] amLODipine  (NORVASC ) 5 MG tablet Take 1 tablet (5 mg total) by mouth daily.   [DISCONTINUED]  ipratropium-albuterol  (DUONEB) 0.5-2.5 (3) MG/3ML SOLN Inhale 3 mLs into the lungs.   No facility-administered encounter medications on file as of 08/14/2024.    Social History: Social History   Tobacco Use   Smoking status: Every Day    Current packs/day: 0.50    Average packs/day: 0.5 packs/day for 26.3 years (13.1 ttl pk-yrs)    Types: Cigarettes    Start date: 12/03/2000    Passive exposure: Current   Smokeless tobacco: Never  Vaping Use   Vaping status: Never Used  Substance Use Topics   Alcohol use: Yes    Alcohol/week: 2.0 standard drinks of alcohol    Types: 2 Glasses of wine per week    Comment: occasionally   Drug use: Never    Family Medical History: Family History  Problem Relation Age of Onset   Breast cancer Mother    Heart attack Mother    Hypertension Mother    Asthma Father    Diabetes Sister    Asthma Sister    Breast cancer Maternal Grandmother    Dementia Paternal Grandmother    Asthma Sister    Obesity Sister    Colon cancer Neg Hx    Esophageal cancer Neg Hx    Stomach cancer Neg Hx    Colon polyps Neg Hx    Pancreatic cancer Neg Hx     Physical Examination: Vitals:   08/14/24 0836 08/14/24 0906  BP: (!) 150/90 (!) 152/92      Awake, alert, oriented to person, place, and time.  Speech is clear and fluent. Fund of knowledge is appropriate.   Cranial Nerves: Pupils equal round and reactive to light.  Facial tone is symmetric.    No abnormal lesions on exposed skin.   Strength:  Side Iliopsoas Quads Hamstring PF DF EHL  R 5 5 5 5 5 5   L 5 5 5 5 5 5    Reflexes are 2+ and symmetric at the patella and achilles.    Clonus is not present.   Bilateral lower extremity sensation is intact to light touch.     No pain with IR/ER of both hips.   Gait is normal.     Medical Decision Making  Imaging: none  Assessment and Plan: [name] has a history of microdiscectomy in 2011. She did well after surgery.   She is doing very well  with minimal left buttock pain and no left leg pain!  She has known moderate to large left paracentral disc protrusion at L5-S1 with significant displacement of the left S1 nerve root.   Treatment options discussed with patient and following plan made:   - Continue HEP from PT.  - She will follow up with me prn.  - I will message her in 6 weeks to check on her.   BP was elevated. No symptoms of  chest pain, shortness of breath, blurry vision, or headaches. She will recheck at home and call PCP if not improved. If she develops CP, SOB, blurry vision, or headaches, then she will go to ED. She will message her PCP. We sent message as well.   I spent a total of 15 minutes in face-to-face and non-face-to-face activities related to this patient's care today including review of outside records, review of imaging, review of symptoms, physical exam, discussion of differential diagnosis, discussion of treatment options, and documentation.   Glade Boys PA-C Dept. of Neurosurgery

## 2024-08-11 DIAGNOSIS — M545 Low back pain, unspecified: Secondary | ICD-10-CM | POA: Diagnosis not present

## 2024-08-14 ENCOUNTER — Encounter: Payer: Self-pay | Admitting: Orthopedic Surgery

## 2024-08-14 ENCOUNTER — Ambulatory Visit: Admitting: Orthopedic Surgery

## 2024-08-14 ENCOUNTER — Other Ambulatory Visit: Payer: Self-pay

## 2024-08-14 VITALS — BP 152/92 | Wt 218.6 lb

## 2024-08-14 DIAGNOSIS — Z9889 Other specified postprocedural states: Secondary | ICD-10-CM

## 2024-08-14 DIAGNOSIS — M5126 Other intervertebral disc displacement, lumbar region: Secondary | ICD-10-CM

## 2024-08-14 DIAGNOSIS — M5416 Radiculopathy, lumbar region: Secondary | ICD-10-CM

## 2024-08-14 DIAGNOSIS — M47816 Spondylosis without myelopathy or radiculopathy, lumbar region: Secondary | ICD-10-CM

## 2024-08-14 DIAGNOSIS — M5127 Other intervertebral disc displacement, lumbosacral region: Secondary | ICD-10-CM | POA: Diagnosis not present

## 2024-08-14 MED ORDER — AMLODIPINE BESYLATE 5 MG PO TABS: 5.0000 mg | ORAL_TABLET | Freq: Every day | ORAL | 2 refills | Status: AC

## 2024-08-15 ENCOUNTER — Ambulatory Visit: Admitting: Orthopedic Surgery

## 2024-08-31 ENCOUNTER — Other Ambulatory Visit: Payer: Self-pay

## 2024-08-31 DIAGNOSIS — E785 Hyperlipidemia, unspecified: Secondary | ICD-10-CM

## 2024-08-31 DIAGNOSIS — E559 Vitamin D deficiency, unspecified: Secondary | ICD-10-CM

## 2024-08-31 DIAGNOSIS — Z6833 Body mass index (BMI) 33.0-33.9, adult: Secondary | ICD-10-CM

## 2024-08-31 DIAGNOSIS — I10 Essential (primary) hypertension: Secondary | ICD-10-CM

## 2024-09-06 ENCOUNTER — Other Ambulatory Visit

## 2024-09-06 DIAGNOSIS — E785 Hyperlipidemia, unspecified: Secondary | ICD-10-CM

## 2024-09-06 DIAGNOSIS — I10 Essential (primary) hypertension: Secondary | ICD-10-CM

## 2024-09-06 DIAGNOSIS — Z6833 Body mass index (BMI) 33.0-33.9, adult: Secondary | ICD-10-CM

## 2024-09-06 DIAGNOSIS — E559 Vitamin D deficiency, unspecified: Secondary | ICD-10-CM

## 2024-09-07 ENCOUNTER — Ambulatory Visit: Payer: Self-pay

## 2024-09-07 LAB — HEMOGLOBIN A1C
Est. average glucose Bld gHb Est-mCnc: 117 mg/dL
Hgb A1c MFr Bld: 5.7 % — ABNORMAL HIGH (ref 4.8–5.6)

## 2024-09-07 LAB — COMPREHENSIVE METABOLIC PANEL WITH GFR
ALT: 19 IU/L (ref 0–32)
AST: 16 IU/L (ref 0–40)
Albumin: 4.4 g/dL (ref 3.9–4.9)
Alkaline Phosphatase: 85 IU/L (ref 41–116)
BUN/Creatinine Ratio: 9 (ref 9–23)
BUN: 8 mg/dL (ref 6–24)
Bilirubin Total: 0.7 mg/dL (ref 0.0–1.2)
CO2: 23 mmol/L (ref 20–29)
Calcium: 9.5 mg/dL (ref 8.7–10.2)
Chloride: 103 mmol/L (ref 96–106)
Creatinine, Ser: 0.92 mg/dL (ref 0.57–1.00)
Globulin, Total: 2.5 g/dL (ref 1.5–4.5)
Glucose: 91 mg/dL (ref 70–99)
Potassium: 3.8 mmol/L (ref 3.5–5.2)
Sodium: 139 mmol/L (ref 134–144)
Total Protein: 6.9 g/dL (ref 6.0–8.5)
eGFR: 78 mL/min/1.73

## 2024-09-07 LAB — CBC WITH DIFFERENTIAL/PLATELET
Basophils Absolute: 0.1 x10E3/uL (ref 0.0–0.2)
Basos: 1 %
EOS (ABSOLUTE): 0.2 x10E3/uL (ref 0.0–0.4)
Eos: 2 %
Hematocrit: 48.2 % — ABNORMAL HIGH (ref 34.0–46.6)
Hemoglobin: 16.4 g/dL — ABNORMAL HIGH (ref 11.1–15.9)
Immature Grans (Abs): 0 x10E3/uL (ref 0.0–0.1)
Immature Granulocytes: 0 %
Lymphocytes Absolute: 2.7 x10E3/uL (ref 0.7–3.1)
Lymphs: 37 %
MCH: 31.1 pg (ref 26.6–33.0)
MCHC: 34 g/dL (ref 31.5–35.7)
MCV: 92 fL (ref 79–97)
Monocytes Absolute: 0.6 x10E3/uL (ref 0.1–0.9)
Monocytes: 8 %
Neutrophils Absolute: 3.8 x10E3/uL (ref 1.4–7.0)
Neutrophils: 52 %
Platelets: 202 x10E3/uL (ref 150–450)
RBC: 5.27 x10E6/uL (ref 3.77–5.28)
RDW: 12.3 % (ref 11.7–15.4)
WBC: 7.4 x10E3/uL (ref 3.4–10.8)

## 2024-09-07 LAB — LIPID PANEL
Chol/HDL Ratio: 4.9 ratio — ABNORMAL HIGH (ref 0.0–4.4)
Cholesterol, Total: 180 mg/dL (ref 100–199)
HDL: 37 mg/dL — ABNORMAL LOW
LDL Chol Calc (NIH): 109 mg/dL — ABNORMAL HIGH (ref 0–99)
Triglycerides: 194 mg/dL — ABNORMAL HIGH (ref 0–149)
VLDL Cholesterol Cal: 34 mg/dL (ref 5–40)

## 2024-09-07 LAB — VITAMIN D 25 HYDROXY (VIT D DEFICIENCY, FRACTURES): Vit D, 25-Hydroxy: 20.5 ng/mL — ABNORMAL LOW (ref 30.0–100.0)

## 2024-09-13 ENCOUNTER — Ambulatory Visit

## 2024-09-13 VITALS — BP 130/74 | HR 79 | Temp 98.1°F | Ht 69.5 in | Wt 223.1 lb

## 2024-09-13 DIAGNOSIS — F17209 Nicotine dependence, unspecified, with unspecified nicotine-induced disorders: Secondary | ICD-10-CM

## 2024-09-13 DIAGNOSIS — M545 Low back pain, unspecified: Secondary | ICD-10-CM | POA: Diagnosis not present

## 2024-09-13 DIAGNOSIS — Z1231 Encounter for screening mammogram for malignant neoplasm of breast: Secondary | ICD-10-CM | POA: Diagnosis not present

## 2024-09-13 DIAGNOSIS — R030 Elevated blood-pressure reading, without diagnosis of hypertension: Secondary | ICD-10-CM | POA: Diagnosis not present

## 2024-09-13 DIAGNOSIS — E559 Vitamin D deficiency, unspecified: Secondary | ICD-10-CM

## 2024-09-13 DIAGNOSIS — E785 Hyperlipidemia, unspecified: Secondary | ICD-10-CM | POA: Diagnosis not present

## 2024-09-13 DIAGNOSIS — K644 Residual hemorrhoidal skin tags: Secondary | ICD-10-CM | POA: Insufficient documentation

## 2024-09-13 DIAGNOSIS — Z1211 Encounter for screening for malignant neoplasm of colon: Secondary | ICD-10-CM

## 2024-09-13 DIAGNOSIS — Z6833 Body mass index (BMI) 33.0-33.9, adult: Secondary | ICD-10-CM

## 2024-09-13 DIAGNOSIS — Z23 Encounter for immunization: Secondary | ICD-10-CM

## 2024-09-13 MED ORDER — VITAMIN D3 50 MCG (2000 UT) PO CAPS: 2000.0000 [IU] | ORAL_CAPSULE | Freq: Every day | ORAL | 5 refills | Status: AC

## 2024-09-13 MED ORDER — CHOLECALCIFEROL 10 MCG/ML (400 UNIT/ML) PO LIQD: 2000.0000 [IU] | Freq: Every day | ORAL | 10 refills | Status: AC

## 2024-09-13 MED ORDER — HYDROCORTISONE 2.5 % EX CREA: TOPICAL_CREAM | Freq: Two times a day (BID) | CUTANEOUS | 2 refills | Status: AC

## 2024-09-13 MED ORDER — PHENTERMINE HCL 37.5 MG PO TABS: 37.5000 mg | ORAL_TABLET | Freq: Every day | ORAL | 2 refills | Status: AC

## 2024-09-13 NOTE — Assessment & Plan Note (Signed)
 SW: 235 CW: 223 Change in weight: -12 lbs Diet: counseled her to focus on high protein, high fiber diet. Gave her a protein goal to hit daily as well as recommendations for fiber supplements.  Exercise: Encouraged her to increase her weekly amount of exercise to 150 min/week.  Medication: Phentermine  37.5 mg  PDMP reviewed, no aberrancies

## 2024-09-13 NOTE — Assessment & Plan Note (Signed)
 Last lipid panel: LDL 109, HDL 37, Trig 194. The 10-year ASCVD risk score (Arnett DK, et al., 2019) is: 6%  Triglycerides still elevated but have improved greatly with weight loss. Advised to continue with lifestyle changes. Will cont to monitor.

## 2024-09-13 NOTE — Assessment & Plan Note (Signed)
 Almost fully resolved. Continue with flexeril  as needed.

## 2024-09-13 NOTE — Assessment & Plan Note (Signed)
 Will send in liquid supplement 2000 units daily per pt request. If pharmacy cannot fill liquid, she is ok with capsules.

## 2024-09-13 NOTE — Assessment & Plan Note (Signed)
 Her goal is to continue decreasing the number of cigarettes she smokes daily. Patient is aware that it is not good for her health and knows that she should stop completely. She would still like to try quitting on her own before trying prescription medications or nicotine replacement.

## 2024-09-13 NOTE — Assessment & Plan Note (Signed)
 Chronic hemorrhoids with no bleeding or significant discomfort.  - Prescribed hydrocortisone  2.5% cream twice daily for 2-3 weeks.

## 2024-09-13 NOTE — Assessment & Plan Note (Signed)
 Blood pressure goal <130/80.  BP at goal in office and on home readings. Discontinued valsartan  several months ago due to sulfa  allergy. Patient is not currently taking her amlodipine .  Advised patient to monitor blood pressure at home and notify of readings consistently higher than 130/80.

## 2024-09-13 NOTE — Progress Notes (Signed)
 "  Established Patient Office Visit  Subjective   Patient ID: Kimberly Armstrong, female    DOB: 11/13/1980  Age: 44 y.o. MRN: [id number]  Chief Complaint  Patient presents with   Medication Management    HPI  History of Present Illness   Kimberly Armstrong is a 44 year old female with hypertension and prediabetes who presents for a follow-up visit.  Chronic pain management - Low-level back pain persists - Continues fentanyl  for pain management  Glycemic control and sugar cravings - Prediabetes with glucose levels increased from 90s to 117 since last May, but most recent glucose 91 - A1c stable at 5.7 over past six months - Sugar cravings, especially at night - Phentermine  initially effective for cravings, now less effective but still working for her  - Lowest weight she got down to was 214, but with the holidays, has had slight re-gain  Tobacco use - Smokes approximately one pack per day, often does not finish cigarettes and puts them out before finishing the whole one  - Desires to quit smoking but has not initiated cessation efforts - Is motivated to quit naturally. Reports that she will notify if she feels medication assistance is necessary.    Vitamin d  deficiency and fatigue - Low vitamin D  levels - Previously took liquid vitamin D  supplements but discontinued - Low energy and fatigue  Hypertension - Takes antihypertensive medication - Monitors blood pressure at home - Blood pressure reported as stable since starting medication   Peripheral cold sensation - Cold feet, especially at night and in cold weather - No skin color changes, numbness, or tingling  Hemorrhoidal symptoms - History of hemorrhoids since second pregnancy - No bleeding, occasional obstructive sensation - No use of creams or treatments         ROS Per HPI.    Objective:     BP 130/74   Pulse 79   Temp 98.1 F (36.7 C) (Oral)   Ht 5' 9.5 (1.765 m)   Wt 223 lb 1.9  oz (101.2 kg)   SpO2 100%   BMI 32.48 kg/m    Physical Exam Constitutional:      General: She is not in acute distress.    Appearance: Normal appearance.  Cardiovascular:     Rate and Rhythm: Normal rate and regular rhythm.     Heart sounds: Normal heart sounds. No murmur heard.    No friction rub. No gallop.  Pulmonary:     Effort: Pulmonary effort is normal. No respiratory distress.     Breath sounds: Normal breath sounds.  Abdominal:     General: Bowel sounds are normal.  Musculoskeletal:        General: No swelling.     Cervical back: Neck supple.  Lymphadenopathy:     Cervical: No cervical adenopathy.  Skin:    General: Skin is warm and dry.  Neurological:     General: No focal deficit present.     Mental Status: She is alert.  Psychiatric:        Mood and Affect: Mood normal.        Behavior: Behavior normal.        Thought Content: Thought content normal.      No results found for any visits on 09/13/24.  Last CBC Lab Results  Component Value Date   WBC 7.4 09/06/2024   HGB 16.4 (H) 09/06/2024   HCT 48.2 (H) 09/06/2024   MCV 92 09/06/2024   MCH 31.1 09/06/2024  RDW 12.3 09/06/2024   PLT 202 09/06/2024   Last metabolic panel Lab Results  Component Value Date   GLUCOSE 91 09/06/2024   NA 139 09/06/2024   K 3.8 09/06/2024   CL 103 09/06/2024   CO2 23 09/06/2024   BUN 8 09/06/2024   CREATININE 0.92 09/06/2024   EGFR 78 09/06/2024   CALCIUM 9.5 09/06/2024   PROT 6.9 09/06/2024   ALBUMIN 4.4 09/06/2024   LABGLOB 2.5 09/06/2024   AGRATIO 1.7 11/03/2022   BILITOT 0.7 09/06/2024   ALKPHOS 85 09/06/2024   AST 16 09/06/2024   ALT 19 09/06/2024   ANIONGAP 8 07/12/2023   Last lipids Lab Results  Component Value Date   CHOL 180 09/06/2024   HDL 37 (L) 09/06/2024   LDLCALC 109 (H) 09/06/2024   TRIG 194 (H) 09/06/2024   CHOLHDL 4.9 (H) 09/06/2024   Last hemoglobin A1c Lab Results  Component Value Date   HGBA1C 5.7 (H) 09/06/2024   Last  thyroid  functions Lab Results  Component Value Date   TSH 2.010 03/01/2024   FREET4 1.24 11/03/2022   Last vitamin D  Lab Results  Component Value Date   VD25OH 20.5 (L) 09/06/2024      The 10-year ASCVD risk score (Arnett DK, et al., 2019) is: 6%    Assessment & Plan:   Screening mammogram for breast cancer -     3D Screening Mammogram, Left and Right; Future  Body mass index (BMI) of 33.0-33.9 in adult Assessment & Plan: SW: 235 CW: 223 Change in weight: -12 lbs Diet: counseled her to focus on high protein, high fiber diet. Gave her a protein goal to hit daily as well as recommendations for fiber supplements.  Exercise: Encouraged her to increase her weekly amount of exercise to 150 min/week.  Medication: Phentermine  37.5 mg  PDMP reviewed, no aberrancies   Screen for colon cancer -     Ambulatory referral to Gastroenterology  External hemorrhoids Assessment & Plan: Chronic hemorrhoids with no bleeding or significant discomfort.  - Prescribed hydrocortisone  2.5% cream twice daily for 2-3 weeks.  Orders: -     Hydrocortisone ; Apply topically 2 (two) times daily.  Dispense: 28 g; Refill: 2  Encounter for vaccination -     Pneumococcal conjugate vaccine 20-valent  Vitamin D  deficiency Assessment & Plan: Will send in liquid supplement 2000 units daily per pt request. If pharmacy cannot fill liquid, she is ok with capsules.    Tobacco use disorder, continuous Assessment & Plan: Her goal is to continue decreasing the number of cigarettes she smokes daily. Patient is aware that it is not good for her health and knows that she should stop completely. She would still like to try quitting on her own before trying prescription medications or nicotine replacement.    Midline low back pain without sciatica, unspecified chronicity Assessment & Plan: Almost fully resolved. Continue with flexeril  as needed.    Hyperlipidemia, unspecified hyperlipidemia type Assessment &  Plan: Last lipid panel: LDL 109, HDL 37, Trig 194. The 10-year ASCVD risk score (Arnett DK, et al., 2019) is: 6%  Triglycerides still elevated but have improved greatly with weight loss. Advised to continue with lifestyle changes. Will cont to monitor.   Elevated blood pressure reading without diagnosis of hypertension Assessment & Plan: Blood pressure goal <130/80.  BP at goal in office and on home readings. Discontinued valsartan  several months ago due to sulfa  allergy. Patient is not currently taking her amlodipine .  Advised patient to monitor blood  pressure at home and notify of readings consistently higher than 130/80.    Other orders -     Cholecalciferol ; Take 5 mLs (2,000 Units total) by mouth daily.  Dispense: 100 mL; Refill: 10 -     Phentermine  HCl; Take 1 tablet (37.5 mg total) by mouth daily before breakfast.  Dispense: 30 tablet; Refill: 2 -     Vitamin D3; Take 1 capsule (2,000 Units total) by mouth daily.  Dispense: 30 capsule; Refill: 5     Return in about 3 months (around 12/12/2024) for HTN, Weight.    Saddie JULIANNA Sacks, PA-C "

## 2024-09-13 NOTE — Patient Instructions (Signed)
 VISIT SUMMARY: During your follow-up visit, we discussed your chronic pain management, glycemic control, tobacco use, lipid levels, vitamin D  deficiency, hypertension, peripheral cold sensation, hemorrhoidal symptoms, and hair and nail changes. We have updated your treatment plan to address these issues.  YOUR PLAN: OBESITY: You have experienced recent weight gain and sugar cravings. Phentermine  was initially effective but is now less so. -Increase phentermine  to 37.5 mg daily.  HYPERTENSION: Your blood pressure is well-controlled with your current medication. -Continue current antihypertensive regimen.  PREDIABETES: Your A1c is stable at 5.7%, but glucose levels need monitoring. -Continue diet and exercise. -Monitor glucose levels and A1c regularly.  VITAMIN D  DEFICIENCY: Low vitamin D  levels may be affecting your energy and mood. -Take liquid vitamin D  2000 IU daily. If liquid is unavailable, take the pill form.  TOBACCO USE DISORDER: You smoke approximately one pack per day and have expressed a desire to quit. -Offered support and resources for smoking cessation.  EXTERNAL HEMORRHOIDS: You have chronic hemorrhoids with no bleeding or significant discomfort. -Apply hydrocortisone  2.5% cream twice daily for 2-3 weeks.  GENERAL HEALTH MAINTENANCE: You are up to date on your health maintenance except for a mammogram and colon cancer screening. -Mammogram ordered at [facility]. -Referred to gastroenterology for colonoscopy.  If you have any problems before your next visit feel free to message me via MyChart (minor issues or questions) or call the office, otherwise you may reach out to schedule an office visit.  Thank you! Saddie Sacks, PA-C

## 2024-10-08 ENCOUNTER — Encounter

## 2024-12-14 ENCOUNTER — Ambulatory Visit
